# Patient Record
Sex: Female | Born: 1945 | Race: Black or African American | Hispanic: No | Marital: Single | Smoking: Former smoker
Health system: Southern US, Community
[De-identification: ages and names within clinical notes are randomized; demographics above are authoritative.]

## PROBLEM LIST (undated history)

## (undated) DIAGNOSIS — J449 Chronic obstructive pulmonary disease, unspecified: Secondary | ICD-10-CM

## (undated) DIAGNOSIS — I10 Essential (primary) hypertension: Secondary | ICD-10-CM

## (undated) DIAGNOSIS — I130 Hypertensive heart and chronic kidney disease with heart failure and stage 1 through stage 4 chronic kidney disease, or unspecified chronic kidney disease: Secondary | ICD-10-CM

## (undated) DIAGNOSIS — E119 Type 2 diabetes mellitus without complications: Secondary | ICD-10-CM

## (undated) DIAGNOSIS — N183 Chronic kidney disease, stage 3 unspecified: Secondary | ICD-10-CM

## (undated) DIAGNOSIS — I5032 Chronic diastolic (congestive) heart failure: Secondary | ICD-10-CM

## (undated) DIAGNOSIS — J9610 Chronic respiratory failure, unspecified whether with hypoxia or hypercapnia: Secondary | ICD-10-CM

## (undated) HISTORY — DX: Chronic kidney disease, stage 3 unspecified: N18.30

## (undated) HISTORY — DX: Chronic diastolic (congestive) heart failure: I50.32

## (undated) HISTORY — PX: CHOLECYSTECTOMY: SHX55

## (undated) HISTORY — DX: Chronic obstructive pulmonary disease, unspecified: J44.9

## (undated) HISTORY — DX: Chronic kidney disease, stage 3 (moderate): N18.3

## (undated) HISTORY — DX: Essential (primary) hypertension: I10

## (undated) HISTORY — DX: Hypertensive heart and chronic kidney disease with heart failure and stage 1 through stage 4 chronic kidney disease, or unspecified chronic kidney disease: I13.0

## (undated) HISTORY — DX: Chronic respiratory failure, unspecified whether with hypoxia or hypercapnia: J96.10

## (undated) HISTORY — DX: Type 2 diabetes mellitus without complications: E11.9

---

## 2000-04-12 DIAGNOSIS — E119 Type 2 diabetes mellitus without complications: Secondary | ICD-10-CM | POA: Insufficient documentation

## 2000-04-12 DIAGNOSIS — E1165 Type 2 diabetes mellitus with hyperglycemia: Secondary | ICD-10-CM | POA: Insufficient documentation

## 2003-10-28 DIAGNOSIS — J449 Chronic obstructive pulmonary disease, unspecified: Secondary | ICD-10-CM | POA: Insufficient documentation

## 2006-10-29 DIAGNOSIS — I519 Heart disease, unspecified: Secondary | ICD-10-CM | POA: Insufficient documentation

## 2011-07-15 DIAGNOSIS — H2510 Age-related nuclear cataract, unspecified eye: Secondary | ICD-10-CM | POA: Insufficient documentation

## 2011-07-15 DIAGNOSIS — H25019 Cortical age-related cataract, unspecified eye: Secondary | ICD-10-CM | POA: Insufficient documentation

## 2012-04-15 DIAGNOSIS — I5020 Unspecified systolic (congestive) heart failure: Secondary | ICD-10-CM | POA: Insufficient documentation

## 2012-09-23 ENCOUNTER — Inpatient Hospital Stay: Payer: Self-pay | Admitting: Internal Medicine

## 2012-09-23 LAB — COMPREHENSIVE METABOLIC PANEL
Albumin: 2.7 g/dL — ABNORMAL LOW (ref 3.4–5.0)
BUN: 15 mg/dL (ref 7–18)
Chloride: 92 mmol/L — ABNORMAL LOW (ref 98–107)
Creatinine: 1.48 mg/dL — ABNORMAL HIGH (ref 0.60–1.30)
EGFR (African American): 43 — ABNORMAL LOW
EGFR (Non-African Amer.): 37 — ABNORMAL LOW
Glucose: 215 mg/dL — ABNORMAL HIGH (ref 65–99)
SGOT(AST): 25 U/L (ref 15–37)
SGPT (ALT): 36 U/L (ref 12–78)
Total Protein: 6.6 g/dL (ref 6.4–8.2)

## 2012-09-23 LAB — URINALYSIS, COMPLETE
Hyaline Cast: 3
Ketone: NEGATIVE
Protein: >=500
Specific Gravity: 1.013 (ref 1.003–1.030)

## 2012-09-23 LAB — BASIC METABOLIC PANEL
Anion Gap: 0 — ABNORMAL LOW (ref 7–16)
BUN: 16 mg/dL (ref 7–18)
Calcium, Total: 8.5 mg/dL (ref 8.5–10.1)
Chloride: 94 mmol/L — ABNORMAL LOW (ref 98–107)
Co2: 44 mmol/L (ref 21–32)
Sodium: 138 mmol/L (ref 136–145)

## 2012-09-23 LAB — CBC WITH DIFFERENTIAL/PLATELET
Basophil %: 0.9 %
Eosinophil #: 0.4 10*3/uL (ref 0.0–0.7)
Eosinophil %: 2.8 %
HCT: 20.3 % — ABNORMAL LOW (ref 35.0–47.0)
HGB: 5.7 g/dL — ABNORMAL LOW (ref 12.0–16.0)
Lymphocyte #: 1.4 10*3/uL (ref 1.0–3.6)
MCH: 18.2 pg — ABNORMAL LOW (ref 26.0–34.0)
MCHC: 28.1 g/dL — ABNORMAL LOW (ref 32.0–36.0)
MCV: 65 fL — ABNORMAL LOW (ref 80–100)
Monocyte #: 0.7 x10 3/mm (ref 0.2–0.9)
Neutrophil #: 12.5 10*3/uL — ABNORMAL HIGH (ref 1.4–6.5)
Neutrophil %: 82.6 %
Platelet: 393 10*3/uL (ref 150–440)
RDW: 20.2 % — ABNORMAL HIGH (ref 11.5–14.5)
WBC: 15.1 10*3/uL — ABNORMAL HIGH (ref 3.6–11.0)

## 2012-09-23 LAB — TROPONIN I: Troponin-I: 0.02 ng/mL

## 2012-09-24 LAB — CBC WITH DIFFERENTIAL/PLATELET
Basophil #: 0 10*3/uL (ref 0.0–0.1)
Eosinophil #: 0 10*3/uL (ref 0.0–0.7)
Eosinophil %: 0 %
HCT: 25.6 % — ABNORMAL LOW (ref 35.0–47.0)
HGB: 7.6 g/dL — ABNORMAL LOW (ref 12.0–16.0)
MCHC: 29.5 g/dL — ABNORMAL LOW (ref 32.0–36.0)
MCV: 69 fL — ABNORMAL LOW (ref 80–100)
Neutrophil #: 15.7 10*3/uL — ABNORMAL HIGH (ref 1.4–6.5)
Neutrophil %: 90.4 %
Platelet: 387 10*3/uL (ref 150–440)
RBC: 3.71 10*6/uL — ABNORMAL LOW (ref 3.80–5.20)
RDW: 22.6 % — ABNORMAL HIGH (ref 11.5–14.5)
WBC: 17.4 10*3/uL — ABNORMAL HIGH (ref 3.6–11.0)

## 2012-09-24 LAB — BASIC METABOLIC PANEL
Co2: 44 mmol/L (ref 21–32)
Glucose: 254 mg/dL — ABNORMAL HIGH (ref 65–99)
Sodium: 142 mmol/L (ref 136–145)

## 2012-09-24 LAB — T4, FREE: Free Thyroxine: 1 ng/dL (ref 0.76–1.46)

## 2012-09-24 LAB — TSH: Thyroid Stimulating Horm: 0.319 u[IU]/mL — ABNORMAL LOW

## 2012-09-25 LAB — BASIC METABOLIC PANEL
Anion Gap: 2 — ABNORMAL LOW (ref 7–16)
Calcium, Total: 8.3 mg/dL — ABNORMAL LOW (ref 8.5–10.1)
Chloride: 93 mmol/L — ABNORMAL LOW (ref 98–107)
Creatinine: 1.73 mg/dL — ABNORMAL HIGH (ref 0.60–1.30)
EGFR (Non-African Amer.): 31 — ABNORMAL LOW
Osmolality: 286 (ref 275–301)

## 2012-09-25 LAB — CBC WITH DIFFERENTIAL/PLATELET
Basophil #: 0 10*3/uL (ref 0.0–0.1)
Eosinophil %: 0.2 %
HCT: 23.5 % — ABNORMAL LOW (ref 35.0–47.0)
HGB: 7.2 g/dL — ABNORMAL LOW (ref 12.0–16.0)
Lymphocyte #: 0.6 10*3/uL — ABNORMAL LOW (ref 1.0–3.6)
Lymphocyte %: 4.1 %
MCH: 20.9 pg — ABNORMAL LOW (ref 26.0–34.0)
Monocyte %: 1.5 %
Neutrophil %: 93.9 %
RBC: 3.43 10*6/uL — ABNORMAL LOW (ref 3.80–5.20)
WBC: 14.7 10*3/uL — ABNORMAL HIGH (ref 3.6–11.0)

## 2012-09-26 LAB — CBC WITH DIFFERENTIAL/PLATELET
Basophil #: 0 10*3/uL (ref 0.0–0.1)
Eosinophil %: 0.1 %
HCT: 26.4 % — ABNORMAL LOW (ref 35.0–47.0)
Lymphocyte %: 7 %
MCH: 21.9 pg — ABNORMAL LOW (ref 26.0–34.0)
MCHC: 31.5 g/dL — ABNORMAL LOW (ref 32.0–36.0)
MCV: 70 fL — ABNORMAL LOW (ref 80–100)
Monocyte #: 0.5 x10 3/mm (ref 0.2–0.9)
Neutrophil #: 12.3 10*3/uL — ABNORMAL HIGH (ref 1.4–6.5)
Platelet: 320 10*3/uL (ref 150–440)
RDW: 25.1 % — ABNORMAL HIGH (ref 11.5–14.5)
WBC: 13.8 10*3/uL — ABNORMAL HIGH (ref 3.6–11.0)

## 2012-09-26 LAB — BASIC METABOLIC PANEL
Anion Gap: 3 — ABNORMAL LOW (ref 7–16)
EGFR (African American): 43 — ABNORMAL LOW
EGFR (Non-African Amer.): 37 — ABNORMAL LOW
Glucose: 295 mg/dL — ABNORMAL HIGH (ref 65–99)
Osmolality: 291 (ref 275–301)
Potassium: 3.6 mmol/L (ref 3.5–5.1)
Sodium: 137 mmol/L (ref 136–145)

## 2012-09-28 LAB — CULTURE, BLOOD (SINGLE)

## 2012-11-10 ENCOUNTER — Ambulatory Visit: Payer: Self-pay | Admitting: Internal Medicine

## 2012-11-28 ENCOUNTER — Inpatient Hospital Stay: Payer: Self-pay | Admitting: Internal Medicine

## 2012-11-28 DIAGNOSIS — I359 Nonrheumatic aortic valve disorder, unspecified: Secondary | ICD-10-CM

## 2012-11-28 LAB — CBC WITH DIFFERENTIAL/PLATELET
Basophil #: 0 10*3/uL (ref 0.0–0.1)
Eosinophil #: 0 10*3/uL (ref 0.0–0.7)
Eosinophil %: 0 %
HCT: 22.4 % — ABNORMAL LOW (ref 35.0–47.0)
HGB: 6.7 g/dL — ABNORMAL LOW (ref 12.0–16.0)
Lymphocyte #: 0.5 10*3/uL — ABNORMAL LOW (ref 1.0–3.6)
Lymphocyte %: 5.3 %
MCHC: 29.9 g/dL — ABNORMAL LOW (ref 32.0–36.0)
MCV: 63 fL — ABNORMAL LOW (ref 80–100)
Monocyte %: 1.3 %
Neutrophil %: 93.2 %
Platelet: 275 10*3/uL (ref 150–440)
RBC: 3.54 10*6/uL — ABNORMAL LOW (ref 3.80–5.20)
WBC: 9.2 10*3/uL (ref 3.6–11.0)

## 2012-11-28 LAB — CBC
MCH: 18.4 pg — ABNORMAL LOW (ref 26.0–34.0)
MCHC: 28.7 g/dL — ABNORMAL LOW (ref 32.0–36.0)
RDW: 21.7 % — ABNORMAL HIGH (ref 11.5–14.5)
WBC: 12.5 10*3/uL — ABNORMAL HIGH (ref 3.6–11.0)

## 2012-11-28 LAB — COMPREHENSIVE METABOLIC PANEL
BUN: 27 mg/dL — ABNORMAL HIGH (ref 7–18)
Calcium, Total: 9.3 mg/dL (ref 8.5–10.1)
Chloride: 90 mmol/L — ABNORMAL LOW (ref 98–107)
Creatinine: 1.48 mg/dL — ABNORMAL HIGH (ref 0.60–1.30)
EGFR (Non-African Amer.): 37 — ABNORMAL LOW
Glucose: 276 mg/dL — ABNORMAL HIGH (ref 65–99)
Osmolality: 289 (ref 275–301)

## 2012-11-28 LAB — IRON AND TIBC
Iron Saturation: 7 %
Iron: 26 ug/dL — ABNORMAL LOW (ref 50–170)

## 2012-11-28 LAB — FERRITIN: Ferritin (ARMC): 10 ng/mL (ref 8–388)

## 2012-11-28 LAB — PRO B NATRIURETIC PEPTIDE: B-Type Natriuretic Peptide: 3276 pg/mL — ABNORMAL HIGH (ref 0–125)

## 2012-11-29 LAB — CBC WITH DIFFERENTIAL/PLATELET
Basophil #: 0 10*3/uL (ref 0.0–0.1)
Basophil %: 0.2 %
Eosinophil #: 0 10*3/uL (ref 0.0–0.7)
Eosinophil %: 0 %
HGB: 8.2 g/dL — ABNORMAL LOW (ref 12.0–16.0)
Lymphocyte #: 1.2 10*3/uL (ref 1.0–3.6)
MCH: 19.7 pg — ABNORMAL LOW (ref 26.0–34.0)
MCHC: 30.3 g/dL — ABNORMAL LOW (ref 32.0–36.0)
Monocyte #: 0.5 x10 3/mm (ref 0.2–0.9)
Monocyte %: 3.6 %
RBC: 4.15 10*6/uL (ref 3.80–5.20)
RDW: 23 % — ABNORMAL HIGH (ref 11.5–14.5)
WBC: 13.7 10*3/uL — ABNORMAL HIGH (ref 3.6–11.0)

## 2012-11-29 LAB — LIPID PANEL
Cholesterol: 217 mg/dL — ABNORMAL HIGH (ref 0–200)
Ldl Cholesterol, Calc: 151 mg/dL — ABNORMAL HIGH (ref 0–100)
Triglycerides: 172 mg/dL (ref 0–200)
VLDL Cholesterol, Calc: 34 mg/dL (ref 5–40)

## 2012-11-29 LAB — COMPREHENSIVE METABOLIC PANEL
Albumin: 2.6 g/dL — ABNORMAL LOW (ref 3.4–5.0)
Alkaline Phosphatase: 107 U/L (ref 50–136)
Bilirubin,Total: 0.7 mg/dL (ref 0.2–1.0)
Calcium, Total: 9.1 mg/dL (ref 8.5–10.1)
Co2: 38 mmol/L — ABNORMAL HIGH (ref 21–32)
Creatinine: 1.83 mg/dL — ABNORMAL HIGH (ref 0.60–1.30)
EGFR (African American): 33 — ABNORMAL LOW
Glucose: 276 mg/dL — ABNORMAL HIGH (ref 65–99)
Potassium: 2.8 mmol/L — ABNORMAL LOW (ref 3.5–5.1)
SGOT(AST): 15 U/L (ref 15–37)
SGPT (ALT): 11 U/L — ABNORMAL LOW (ref 12–78)

## 2012-11-29 LAB — TSH: Thyroid Stimulating Horm: 0.3 u[IU]/mL — ABNORMAL LOW

## 2012-11-29 LAB — HEMOGLOBIN A1C: Hemoglobin A1C: 6.1 % (ref 4.2–6.3)

## 2012-11-29 LAB — POTASSIUM: Potassium: 3.7 mmol/L (ref 3.5–5.1)

## 2012-11-30 LAB — CBC WITH DIFFERENTIAL/PLATELET
Basophil #: 0 10*3/uL (ref 0.0–0.1)
Basophil %: 0.2 %
Eosinophil #: 0 10*3/uL (ref 0.0–0.7)
Eosinophil %: 0 %
HGB: 8.6 g/dL — ABNORMAL LOW (ref 12.0–16.0)
Lymphocyte #: 1.9 10*3/uL (ref 1.0–3.6)
MCH: 20 pg — ABNORMAL LOW (ref 26.0–34.0)
MCHC: 30.7 g/dL — ABNORMAL LOW (ref 32.0–36.0)
MCV: 65 fL — ABNORMAL LOW (ref 80–100)
Monocyte #: 0.4 x10 3/mm (ref 0.2–0.9)
Neutrophil #: 23 10*3/uL — ABNORMAL HIGH (ref 1.4–6.5)
Neutrophil %: 90.8 %
RDW: 23.3 % — ABNORMAL HIGH (ref 11.5–14.5)
WBC: 25.4 10*3/uL — ABNORMAL HIGH (ref 3.6–11.0)

## 2012-11-30 LAB — BASIC METABOLIC PANEL
Anion Gap: 7 (ref 7–16)
BUN: 36 mg/dL — ABNORMAL HIGH (ref 7–18)
Chloride: 104 mmol/L (ref 98–107)
Co2: 34 mmol/L — ABNORMAL HIGH (ref 21–32)

## 2012-11-30 LAB — MAGNESIUM: Magnesium: 1.9 mg/dL

## 2012-12-01 LAB — CBC WITH DIFFERENTIAL/PLATELET
Basophil %: 0.1 %
Eosinophil #: 0 10*3/uL (ref 0.0–0.7)
HCT: 25.4 % — ABNORMAL LOW (ref 35.0–47.0)
HGB: 7.7 g/dL — ABNORMAL LOW (ref 12.0–16.0)
Lymphocyte #: 1.2 10*3/uL (ref 1.0–3.6)
Lymphocyte %: 4.7 %
MCH: 19.9 pg — ABNORMAL LOW (ref 26.0–34.0)
Monocyte %: 3.2 %
Neutrophil #: 23.6 10*3/uL — ABNORMAL HIGH (ref 1.4–6.5)
Neutrophil %: 92 %
Platelet: 284 10*3/uL (ref 150–440)
RDW: 24 % — ABNORMAL HIGH (ref 11.5–14.5)

## 2012-12-01 LAB — BASIC METABOLIC PANEL
Anion Gap: 3 — ABNORMAL LOW (ref 7–16)
EGFR (Non-African Amer.): 28 — ABNORMAL LOW
Osmolality: 301 (ref 275–301)
Sodium: 144 mmol/L (ref 136–145)

## 2012-12-01 LAB — OCCULT BLOOD X 1 CARD TO LAB, STOOL: Occult Blood, Feces: NEGATIVE

## 2012-12-01 LAB — HEMOGLOBIN: HGB: 8.2 g/dL — ABNORMAL LOW (ref 12.0–16.0)

## 2012-12-02 LAB — CBC WITH DIFFERENTIAL/PLATELET
Basophil %: 0.1 %
Eosinophil #: 0 10*3/uL (ref 0.0–0.7)
Eosinophil %: 0.1 %
HCT: 27.6 % — ABNORMAL LOW (ref 35.0–47.0)
Lymphocyte %: 6.8 %
MCH: 17.6 pg — ABNORMAL LOW (ref 26.0–34.0)
MCV: 66 fL — ABNORMAL LOW (ref 80–100)
Platelet: 279 10*3/uL (ref 150–440)
RBC: 4.19 10*6/uL (ref 3.80–5.20)
WBC: 27 10*3/uL — ABNORMAL HIGH (ref 3.6–11.0)

## 2012-12-02 LAB — BASIC METABOLIC PANEL
Anion Gap: 3 — ABNORMAL LOW (ref 7–16)
Chloride: 106 mmol/L (ref 98–107)
EGFR (African American): 43 — ABNORMAL LOW
Glucose: 168 mg/dL — ABNORMAL HIGH (ref 65–99)
Potassium: 3.2 mmol/L — ABNORMAL LOW (ref 3.5–5.1)
Sodium: 144 mmol/L (ref 136–145)

## 2012-12-03 LAB — BASIC METABOLIC PANEL
Anion Gap: 1 — ABNORMAL LOW (ref 7–16)
BUN: 37 mg/dL — ABNORMAL HIGH (ref 7–18)
Calcium, Total: 8.6 mg/dL (ref 8.5–10.1)
Co2: 36 mmol/L — ABNORMAL HIGH (ref 21–32)
Creatinine: 1.45 mg/dL — ABNORMAL HIGH (ref 0.60–1.30)
EGFR (African American): 44 — ABNORMAL LOW
EGFR (Non-African Amer.): 38 — ABNORMAL LOW
Glucose: 224 mg/dL — ABNORMAL HIGH (ref 65–99)
Potassium: 4.1 mmol/L (ref 3.5–5.1)

## 2012-12-03 LAB — CBC WITH DIFFERENTIAL/PLATELET
Basophil #: 0 10*3/uL (ref 0.0–0.1)
Basophil %: 0.1 %
HCT: 27.9 % — ABNORMAL LOW (ref 35.0–47.0)
HGB: 7.3 g/dL — ABNORMAL LOW (ref 12.0–16.0)
Lymphocyte %: 4.7 %
MCH: 17.4 pg — ABNORMAL LOW (ref 26.0–34.0)
MCHC: <26.5 g/dL (ref 32.0–36.0)
Monocyte #: 0.7 x10 3/mm (ref 0.2–0.9)
Monocyte %: 2.5 %
Neutrophil %: 92.6 %
Platelet: 262 10*3/uL (ref 150–440)
RBC: 4.2 10*6/uL (ref 3.80–5.20)
WBC: 28 10*3/uL — ABNORMAL HIGH (ref 3.6–11.0)

## 2012-12-03 LAB — CULTURE, BLOOD (SINGLE)

## 2012-12-04 LAB — CBC WITH DIFFERENTIAL/PLATELET
Basophil #: 0 10*3/uL (ref 0.0–0.1)
Eosinophil %: 0.7 %
Lymphocyte #: 1.2 10*3/uL (ref 1.0–3.6)
MCHC: 30.7 g/dL — ABNORMAL LOW (ref 32.0–36.0)
MCV: 68 fL — ABNORMAL LOW (ref 80–100)
Monocyte #: 0.4 x10 3/mm (ref 0.2–0.9)
Monocyte %: 2.1 %
Neutrophil #: 18 10*3/uL — ABNORMAL HIGH (ref 1.4–6.5)
RBC: 4.34 10*6/uL (ref 3.80–5.20)
RDW: 25.8 % — ABNORMAL HIGH (ref 11.5–14.5)
WBC: 19.7 10*3/uL — ABNORMAL HIGH (ref 3.6–11.0)

## 2012-12-04 LAB — BASIC METABOLIC PANEL
Anion Gap: 4 — ABNORMAL LOW (ref 7–16)
Calcium, Total: 9 mg/dL (ref 8.5–10.1)
Co2: 33 mmol/L — ABNORMAL HIGH (ref 21–32)
Creatinine: 1.26 mg/dL (ref 0.60–1.30)
Osmolality: 302 (ref 275–301)
Potassium: 4 mmol/L (ref 3.5–5.1)

## 2012-12-07 LAB — BASIC METABOLIC PANEL
Anion Gap: 2 — ABNORMAL LOW (ref 7–16)
BUN: 31 mg/dL — ABNORMAL HIGH (ref 7–18)
Calcium, Total: 8.5 mg/dL (ref 8.5–10.1)
Chloride: 104 mmol/L (ref 98–107)
Creatinine: 1.3 mg/dL (ref 0.60–1.30)
EGFR (African American): 50 — ABNORMAL LOW
EGFR (Non-African Amer.): 43 — ABNORMAL LOW
Glucose: 157 mg/dL — ABNORMAL HIGH (ref 65–99)
Osmolality: 291 (ref 275–301)
Potassium: 3.5 mmol/L (ref 3.5–5.1)
Sodium: 141 mmol/L (ref 136–145)

## 2012-12-07 LAB — CBC WITH DIFFERENTIAL/PLATELET
Basophil #: 0 10*3/uL (ref 0.0–0.1)
Eosinophil %: 1.5 %
HCT: 27.7 % — ABNORMAL LOW (ref 35.0–47.0)
Lymphocyte #: 2.1 10*3/uL (ref 1.0–3.6)
Lymphocyte %: 12.9 %
MCH: 20.2 pg — ABNORMAL LOW (ref 26.0–34.0)
MCHC: 29.7 g/dL — ABNORMAL LOW (ref 32.0–36.0)
Monocyte #: 1.9 x10 3/mm — ABNORMAL HIGH (ref 0.2–0.9)
Neutrophil #: 11.9 10*3/uL — ABNORMAL HIGH (ref 1.4–6.5)
Platelet: 201 10*3/uL (ref 150–440)
RBC: 4.07 10*6/uL (ref 3.80–5.20)
RDW: 25.5 % — ABNORMAL HIGH (ref 11.5–14.5)

## 2012-12-09 LAB — BASIC METABOLIC PANEL
Calcium, Total: 8.9 mg/dL (ref 8.5–10.1)
Chloride: 104 mmol/L (ref 98–107)
Co2: 36 mmol/L — ABNORMAL HIGH (ref 21–32)
Creatinine: 1.35 mg/dL — ABNORMAL HIGH (ref 0.60–1.30)
EGFR (African American): 48 — ABNORMAL LOW
EGFR (Non-African Amer.): 41 — ABNORMAL LOW
Glucose: 164 mg/dL — ABNORMAL HIGH (ref 65–99)
Osmolality: 292 (ref 275–301)
Potassium: 4.2 mmol/L (ref 3.5–5.1)
Sodium: 141 mmol/L (ref 136–145)

## 2012-12-09 LAB — CBC WITH DIFFERENTIAL/PLATELET
Basophil %: 0.2 %
Eosinophil #: 0.1 10*3/uL (ref 0.0–0.7)
Eosinophil %: 0.4 %
HGB: 9 g/dL — ABNORMAL LOW (ref 12.0–16.0)
MCHC: 31 g/dL — ABNORMAL LOW (ref 32.0–36.0)
Monocyte %: 6.6 %
Neutrophil #: 15.3 10*3/uL — ABNORMAL HIGH (ref 1.4–6.5)
Neutrophil %: 82.7 %
RBC: 4.21 10*6/uL (ref 3.80–5.20)
RDW: 26.1 % — ABNORMAL HIGH (ref 11.5–14.5)
WBC: 18.5 10*3/uL — ABNORMAL HIGH (ref 3.6–11.0)

## 2012-12-11 ENCOUNTER — Ambulatory Visit: Payer: Self-pay | Admitting: Internal Medicine

## 2012-12-21 ENCOUNTER — Other Ambulatory Visit: Payer: Self-pay | Admitting: Family Medicine

## 2012-12-21 LAB — CBC WITH DIFFERENTIAL/PLATELET
Basophil #: 0.1 10*3/uL (ref 0.0–0.1)
HCT: 27.1 % — ABNORMAL LOW (ref 35.0–47.0)
Lymphocyte %: 11.7 %
MCV: 70 fL — ABNORMAL LOW (ref 80–100)
Neutrophil #: 6.8 10*3/uL — ABNORMAL HIGH (ref 1.4–6.5)
Neutrophil %: 75.7 %
RBC: 3.85 10*6/uL (ref 3.80–5.20)

## 2012-12-21 LAB — URINALYSIS, COMPLETE
Bilirubin,UR: NEGATIVE
Hyaline Cast: 3
Ketone: NEGATIVE
Leukocyte Esterase: NEGATIVE
Nitrite: NEGATIVE
Protein: >=500
Specific Gravity: 1.018 (ref 1.003–1.030)
WBC UR: 1 /HPF (ref 0–5)

## 2012-12-21 LAB — BASIC METABOLIC PANEL
Anion Gap: 3 — ABNORMAL LOW (ref 7–16)
BUN: 10 mg/dL (ref 7–18)
Calcium, Total: 8.5 mg/dL (ref 8.5–10.1)
Chloride: 104 mmol/L (ref 98–107)
Creatinine: 1.11 mg/dL (ref 0.60–1.30)
EGFR (African American): >60
EGFR (Non-African Amer.): 52 — ABNORMAL LOW
Potassium: 3.3 mmol/L — ABNORMAL LOW (ref 3.5–5.1)

## 2012-12-22 ENCOUNTER — Inpatient Hospital Stay: Payer: Self-pay | Admitting: Internal Medicine

## 2012-12-22 LAB — IRON AND TIBC
Iron Bind.Cap.(Total): 223 ug/dL — ABNORMAL LOW (ref 250–450)
Iron: 33 ug/dL — ABNORMAL LOW (ref 50–170)

## 2012-12-22 LAB — BASIC METABOLIC PANEL
Anion Gap: 2 — ABNORMAL LOW (ref 7–16)
BUN: 8 mg/dL (ref 7–18)
Calcium, Total: 8.7 mg/dL (ref 8.5–10.1)
Chloride: 104 mmol/L (ref 98–107)
EGFR (Non-African Amer.): 50 — ABNORMAL LOW
Osmolality: 289 (ref 275–301)
Sodium: 145 mmol/L (ref 136–145)

## 2012-12-22 LAB — CK TOTAL AND CKMB (NOT AT ARMC)
CK, Total: 53 U/L (ref 21–215)
CK-MB: <0.5 ng/mL — ABNORMAL LOW (ref 0.5–3.6)
CK-MB: 1 ng/mL (ref 0.5–3.6)

## 2012-12-22 LAB — TROPONIN I
Troponin-I: <0.02 ng/mL
Troponin-I: 0.02 ng/mL

## 2012-12-22 LAB — TSH: Thyroid Stimulating Horm: 1.17 u[IU]/mL

## 2012-12-22 LAB — FERRITIN: Ferritin (ARMC): 79 ng/mL (ref 8–388)

## 2012-12-22 LAB — PRO B NATRIURETIC PEPTIDE: B-Type Natriuretic Peptide: 10174 pg/mL — ABNORMAL HIGH (ref 0–125)

## 2012-12-22 LAB — URINE CULTURE

## 2012-12-23 LAB — CBC WITH DIFFERENTIAL/PLATELET
Basophil #: 0 10*3/uL (ref 0.0–0.1)
Eosinophil #: 0.4 10*3/uL (ref 0.0–0.7)
Eosinophil %: 4.4 %
HCT: 26.7 % — ABNORMAL LOW (ref 35.0–47.0)
Lymphocyte #: 0.7 10*3/uL — ABNORMAL LOW (ref 1.0–3.6)
Lymphocyte %: 8.4 %
MCHC: 31.1 g/dL — ABNORMAL LOW (ref 32.0–36.0)
Monocyte %: 5.8 %
Neutrophil #: 7 10*3/uL — ABNORMAL HIGH (ref 1.4–6.5)
Platelet: 249 10*3/uL (ref 150–440)
WBC: 8.7 10*3/uL (ref 3.6–11.0)

## 2012-12-23 LAB — COMPREHENSIVE METABOLIC PANEL
Albumin: 2.5 g/dL — ABNORMAL LOW (ref 3.4–5.0)
Alkaline Phosphatase: 85 U/L (ref 50–136)
BUN: 10 mg/dL (ref 7–18)
Calcium, Total: 8.7 mg/dL (ref 8.5–10.1)
Chloride: 101 mmol/L (ref 98–107)
Co2: 38 mmol/L — ABNORMAL HIGH (ref 21–32)
Glucose: 153 mg/dL — ABNORMAL HIGH (ref 65–99)
SGPT (ALT): 18 U/L (ref 12–78)

## 2012-12-23 LAB — LIPID PANEL
Cholesterol: 139 mg/dL (ref 0–200)
Triglycerides: 101 mg/dL (ref 0–200)
VLDL Cholesterol, Calc: 20 mg/dL (ref 5–40)

## 2012-12-24 DIAGNOSIS — J449 Chronic obstructive pulmonary disease, unspecified: Secondary | ICD-10-CM

## 2012-12-24 DIAGNOSIS — I5033 Acute on chronic diastolic (congestive) heart failure: Secondary | ICD-10-CM

## 2012-12-24 DIAGNOSIS — I10 Essential (primary) hypertension: Secondary | ICD-10-CM

## 2012-12-24 LAB — BASIC METABOLIC PANEL
Anion Gap: 2 — ABNORMAL LOW (ref 7–16)
BUN: 13 mg/dL (ref 7–18)
Calcium, Total: 8.9 mg/dL (ref 8.5–10.1)
Chloride: 98 mmol/L (ref 98–107)
Co2: 42 mmol/L (ref 21–32)
Osmolality: 287 (ref 275–301)
Potassium: 4 mmol/L (ref 3.5–5.1)
Sodium: 142 mmol/L (ref 136–145)

## 2012-12-25 LAB — BASIC METABOLIC PANEL
BUN: 15 mg/dL (ref 7–18)
Calcium, Total: 9.3 mg/dL (ref 8.5–10.1)
Chloride: 96 mmol/L — ABNORMAL LOW (ref 98–107)
Co2: 44 mmol/L (ref 21–32)
Creatinine: 1.06 mg/dL (ref 0.60–1.30)
EGFR (Non-African Amer.): 55 — ABNORMAL LOW
Glucose: 153 mg/dL — ABNORMAL HIGH (ref 65–99)
Sodium: 139 mmol/L (ref 136–145)

## 2012-12-25 LAB — HEMOGLOBIN: HGB: 8.6 g/dL — ABNORMAL LOW (ref 12.0–16.0)

## 2012-12-31 ENCOUNTER — Other Ambulatory Visit: Payer: Self-pay | Admitting: Family Medicine

## 2012-12-31 LAB — CBC WITH DIFFERENTIAL/PLATELET
Basophil #: 0.1 10*3/uL (ref 0.0–0.1)
Eosinophil %: 4.7 %
HCT: 27.2 % — ABNORMAL LOW (ref 35.0–47.0)
HGB: 8.5 g/dL — ABNORMAL LOW (ref 12.0–16.0)
Lymphocyte #: 1.2 10*3/uL (ref 1.0–3.6)
MCH: 21.9 pg — ABNORMAL LOW (ref 26.0–34.0)
MCHC: 31.2 g/dL — ABNORMAL LOW (ref 32.0–36.0)
Monocyte #: 0.5 x10 3/mm (ref 0.2–0.9)
Monocyte %: 6.5 %
Neutrophil #: 5.5 10*3/uL (ref 1.4–6.5)
Neutrophil %: 72 %
Platelet: 237 10*3/uL (ref 150–440)
RBC: 3.87 10*6/uL (ref 3.80–5.20)
RDW: 22.6 % — ABNORMAL HIGH (ref 11.5–14.5)
WBC: 7.6 10*3/uL (ref 3.6–11.0)

## 2012-12-31 LAB — COMPREHENSIVE METABOLIC PANEL
Anion Gap: 1 — ABNORMAL LOW (ref 7–16)
BUN: 18 mg/dL (ref 7–18)
Bilirubin,Total: 0.2 mg/dL (ref 0.2–1.0)
Co2: 39 mmol/L — ABNORMAL HIGH (ref 21–32)
EGFR (African American): >60
Glucose: 149 mg/dL — ABNORMAL HIGH (ref 65–99)
Osmolality: 282 (ref 275–301)
SGOT(AST): 18 U/L (ref 15–37)

## 2013-01-01 ENCOUNTER — Encounter: Payer: Self-pay | Admitting: *Deleted

## 2013-01-01 ENCOUNTER — Encounter: Payer: Medicare Other | Admitting: Cardiovascular Disease

## 2013-01-19 ENCOUNTER — Institutional Professional Consult (permissible substitution): Payer: Medicare Other | Admitting: Pulmonary Disease

## 2013-01-21 ENCOUNTER — Encounter: Payer: Medicare Other | Admitting: Cardiovascular Disease

## 2013-01-26 ENCOUNTER — Institutional Professional Consult (permissible substitution): Payer: Medicare Other | Admitting: Pulmonary Disease

## 2013-01-26 ENCOUNTER — Encounter: Payer: Self-pay | Admitting: Cardiovascular Disease

## 2013-01-26 ENCOUNTER — Ambulatory Visit (INDEPENDENT_AMBULATORY_CARE_PROVIDER_SITE_OTHER): Payer: Medicare Other | Admitting: Cardiovascular Disease

## 2013-01-26 VITALS — BP 132/79 | HR 64 | Ht 64.0 in | Wt 204.8 lb

## 2013-01-26 DIAGNOSIS — R0602 Shortness of breath: Secondary | ICD-10-CM

## 2013-01-26 DIAGNOSIS — I10 Essential (primary) hypertension: Secondary | ICD-10-CM

## 2013-01-26 DIAGNOSIS — I5032 Chronic diastolic (congestive) heart failure: Secondary | ICD-10-CM

## 2013-01-26 NOTE — Assessment & Plan Note (Signed)
Blood pressure has been well-controlled on current medications which makes it unlikely that she has significant renal artery stenosis.

## 2013-01-26 NOTE — Assessment & Plan Note (Signed)
She appears to be euvolemic on current dose of Lasix 80 mg in the morning and 40 mg in the afternoon. I had a prolonged discussion with her about the importance of low sodium diet as well as daily monitoring of her weight. I have a suspicion that she is not fully compliant with low sodium diet which was emphasized today. She does have chronic kidney disease and renal function should be monitored on a regular basis. She has no symptoms suggestive of underlying ischemic heart disease. Continue medical therapy.

## 2013-01-26 NOTE — Progress Notes (Signed)
HPI  This is a 67 year old female who was referred by Dr. Dario Guardian for management of chronic diastolic heart failure. She has multiple comorbidities including obesity, COPD on 3 L of oxygen, hypertension, type 2 diabetes, chronic kidney disease and chronic diastolic heart failure. She had 2 hospitalizations in July and August for acute on chronic diastolic heart failure with significant hypoxia. She was out of furosemide on both occasions. She is normally on 80 mg in the morning and 40 mg in the afternoon. She responded well to IV diuretics. She was seen by our team while hospitalized. Since hospital discharge, she has been doing reasonably well his she reports improvement in dyspnea. She denies any chest discomfort. No orthopnea or PND. No extremity edema has improved significantly. Most recent echocardiogram cause in July 2014 which showed an ejection fraction of 55-60% with mildly dilated left atrium, mild aortic regurgitation and mild tricuspid regurgitation with no evidence of pulmonary hypertension.  No Known Allergies   Current Outpatient Prescriptions on File Prior to Visit  Medication Sig Dispense Refill  . acetaminophen (TYLENOL) 325 MG tablet Take 325 mg by mouth every 4 (four) hours as needed for pain.      . pravastatin (PRAVACHOL) 20 MG tablet Take 20 mg by mouth daily.      . quinapril (ACCUPRIL) 40 MG tablet Take 40 mg by mouth 2 (two) times daily.        No current facility-administered medications on file prior to visit.     Past Medical History  Diagnosis Date  . COPD (chronic obstructive pulmonary disease)   . Chronic respiratory failure   . Diabetes mellitus without complication   . Chronic diastolic heart failure   . Hypertension   . 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 Cholecystectomy       Family History  Problem Relation Age of  Onset  . Hypertension Mother   . Hypertension Father      History   Social History  . Marital Status: Single    Spouse Name: N/A    Number of Children: N/A  . Years of Education: N/A   Occupational History  . Not on file.   Social History Main Topics  . Smoking status: Former Smoker -- 3.00 packs/day for 20 years  . Smokeless tobacco: Not on file  . Alcohol Use: No  . Drug Use: No  . Sexual Activity: Not on file   Other Topics Concern  . Not on file   Social History Narrative  . No narrative on file     PHYSICAL EXAM   BP 132/79  Pulse 64  Ht 5\' 4"  (1.626 m)  Wt 204 lb 12 oz (92.874 kg)  BMI 35.13 kg/m2 Constitutional: She is oriented to person, place, and time. She appears well-developed and well-nourished. No distress.  HENT: No nasal discharge.  Head: Normocephalic and atraumatic.  Eyes: Pupils are equal and round. Right eye exhibits no discharge. Left eye exhibits no discharge.  Neck: Normal range of motion. Neck supple. No JVD present. No thyromegaly present.  Cardiovascular: Normal rate, regular rhythm, normal heart sounds. Exam reveals no gallop and no friction rub. No murmur heard.  Pulmonary/Chest: Effort normal and diminished breath sounds. No stridor. No respiratory distress. She has no wheezes. She has no rales. She exhibits no tenderness.  Abdominal: Soft. Bowel sounds are normal. She  exhibits no distension. There is no tenderness. There is no rebound and no guarding.  Musculoskeletal: Normal range of motion. She exhibits trace edema and no tenderness.  Neurological: She is alert and oriented to person, place, and time. Coordination normal.  Skin: Skin is warm and dry. No rash noted. She is not diaphoretic. No erythema. No pallor.  Psychiatric: She has a normal mood and affect. Her behavior is normal. Judgment and thought content normal.     EKG: Normal sinus rhythm with nonspecific T wave changes   ASSESSMENT AND PLAN

## 2013-01-26 NOTE — Patient Instructions (Addendum)
Continue same medications.  Follow up in 3 months.  

## 2013-02-23 ENCOUNTER — Institutional Professional Consult (permissible substitution): Payer: Medicare Other | Admitting: Pulmonary Disease

## 2013-04-13 ENCOUNTER — Institutional Professional Consult (permissible substitution): Payer: Medicare Other | Admitting: Pulmonary Disease

## 2013-04-27 ENCOUNTER — Ambulatory Visit: Payer: Medicare Other | Admitting: Cardiovascular Disease

## 2013-05-03 ENCOUNTER — Ambulatory Visit: Payer: Medicare Other | Admitting: Cardiovascular Disease

## 2013-05-03 ENCOUNTER — Encounter: Payer: Self-pay | Admitting: *Deleted

## 2013-05-24 ENCOUNTER — Institutional Professional Consult (permissible substitution): Payer: Medicare Other | Admitting: Pulmonary Disease

## 2013-06-15 ENCOUNTER — Ambulatory Visit (INDEPENDENT_AMBULATORY_CARE_PROVIDER_SITE_OTHER): Payer: Medicare Other | Admitting: Pulmonary Disease

## 2013-06-15 DIAGNOSIS — R0602 Shortness of breath: Secondary | ICD-10-CM

## 2013-06-15 NOTE — Progress Notes (Signed)
No show

## 2013-06-18 ENCOUNTER — Encounter: Payer: Self-pay | Admitting: Pulmonary Disease

## 2013-08-03 ENCOUNTER — Ambulatory Visit: Payer: Self-pay | Admitting: Internal Medicine

## 2013-09-17 ENCOUNTER — Encounter: Payer: Self-pay | Admitting: Pulmonary Disease

## 2013-09-21 ENCOUNTER — Institutional Professional Consult (permissible substitution): Payer: Medicare Other | Admitting: Pulmonary Disease

## 2013-10-10 ENCOUNTER — Inpatient Hospital Stay: Payer: Self-pay | Admitting: Internal Medicine

## 2013-10-10 LAB — CK TOTAL AND CKMB (NOT AT ARMC)
CK, Total: 80 U/L
CK-MB: 1 ng/mL (ref 0.5–3.6)

## 2013-10-10 LAB — BASIC METABOLIC PANEL
ANION GAP: 6 — AB (ref 7–16)
BUN: 58 mg/dL — ABNORMAL HIGH (ref 7–18)
CHLORIDE: 97 mmol/L — AB (ref 98–107)
CREATININE: 2.87 mg/dL — AB (ref 0.60–1.30)
Calcium, Total: 8.9 mg/dL (ref 8.5–10.1)
Co2: 30 mmol/L (ref 21–32)
EGFR (African American): 19 — ABNORMAL LOW
GFR CALC NON AF AMER: 17 — AB
Glucose: 105 mg/dL — ABNORMAL HIGH (ref 65–99)
Osmolality: 283 (ref 275–301)
Potassium: 4.5 mmol/L (ref 3.5–5.1)
SODIUM: 133 mmol/L — AB (ref 136–145)

## 2013-10-10 LAB — CBC
HCT: 30.7 % — ABNORMAL LOW (ref 35.0–47.0)
HGB: 9.3 g/dL — ABNORMAL LOW (ref 12.0–16.0)
MCH: 20.9 pg — ABNORMAL LOW (ref 26.0–34.0)
MCHC: 30.4 g/dL — AB (ref 32.0–36.0)
MCV: 69 fL — ABNORMAL LOW (ref 80–100)
Platelet: 230 10*3/uL (ref 150–440)
RBC: 4.46 10*6/uL (ref 3.80–5.20)
RDW: 15.2 % — ABNORMAL HIGH (ref 11.5–14.5)
WBC: 24.1 10*3/uL — ABNORMAL HIGH (ref 3.6–11.0)

## 2013-10-10 LAB — TROPONIN I: Troponin-I: <0.02 ng/mL

## 2013-10-11 LAB — CBC WITH DIFFERENTIAL/PLATELET
BASOS PCT: 0.6 %
Basophil #: 0.1 10*3/uL (ref 0.0–0.1)
Eosinophil #: 0 10*3/uL (ref 0.0–0.7)
Eosinophil %: 0.1 %
HCT: 28.5 % — AB (ref 35.0–47.0)
HGB: 8.6 g/dL — ABNORMAL LOW (ref 12.0–16.0)
LYMPHS PCT: 3.9 %
Lymphocyte #: 1 10*3/uL (ref 1.0–3.6)
MCH: 20.8 pg — AB (ref 26.0–34.0)
MCHC: 30 g/dL — ABNORMAL LOW (ref 32.0–36.0)
MCV: 69 fL — ABNORMAL LOW (ref 80–100)
MONO ABS: 1.8 x10 3/mm — AB (ref 0.2–0.9)
Monocyte %: 7.3 %
Neutrophil #: 22.1 10*3/uL — ABNORMAL HIGH (ref 1.4–6.5)
Neutrophil %: 88.1 %
Platelet: 232 10*3/uL (ref 150–440)
RBC: 4.11 10*6/uL (ref 3.80–5.20)
RDW: 15.3 % — ABNORMAL HIGH (ref 11.5–14.5)
WBC: 25.1 10*3/uL — ABNORMAL HIGH (ref 3.6–11.0)

## 2013-10-11 LAB — BASIC METABOLIC PANEL
Anion Gap: 2 — ABNORMAL LOW (ref 7–16)
BUN: 62 mg/dL — ABNORMAL HIGH (ref 7–18)
CALCIUM: 8.6 mg/dL (ref 8.5–10.1)
CREATININE: 3.56 mg/dL — AB (ref 0.60–1.30)
Chloride: 97 mmol/L — ABNORMAL LOW (ref 98–107)
Co2: 31 mmol/L (ref 21–32)
EGFR (African American): 15 — ABNORMAL LOW
EGFR (Non-African Amer.): 13 — ABNORMAL LOW
GLUCOSE: 193 mg/dL — AB (ref 65–99)
Osmolality: 284 (ref 275–301)
POTASSIUM: 5.1 mmol/L (ref 3.5–5.1)
SODIUM: 130 mmol/L — AB (ref 136–145)

## 2013-10-11 LAB — URINALYSIS, COMPLETE
Bilirubin,UR: NEGATIVE
Blood: NEGATIVE
GLUCOSE, UR: NEGATIVE mg/dL (ref 0–75)
Ketone: NEGATIVE
NITRITE: NEGATIVE
Ph: 5 (ref 4.5–8.0)
Protein: 30
RBC,UR: 3 /HPF (ref 0–5)
Specific Gravity: 1.017 (ref 1.003–1.030)
WBC UR: 11 /HPF (ref 0–5)

## 2013-10-11 LAB — HEMOGLOBIN A1C: Hemoglobin A1C: 8.4 % — ABNORMAL HIGH (ref 4.2–6.3)

## 2013-10-11 LAB — MAGNESIUM: Magnesium: 2 mg/dL

## 2013-10-12 ENCOUNTER — Institutional Professional Consult (permissible substitution): Payer: Medicare Other | Admitting: Pulmonary Disease

## 2013-10-12 LAB — IRON AND TIBC
Iron Bind.Cap.(Total): 229 ug/dL — ABNORMAL LOW (ref 250–450)
Iron Saturation: 9 %
Iron: 20 ug/dL — ABNORMAL LOW (ref 50–170)
Unbound Iron-Bind.Cap.: 209 ug/dL

## 2013-10-12 LAB — CBC WITH DIFFERENTIAL/PLATELET
Basophil #: 0 10*3/uL (ref 0.0–0.1)
Basophil %: 0.2 %
EOS ABS: 0.3 10*3/uL (ref 0.0–0.7)
EOS PCT: 1.5 %
HCT: 25.2 % — ABNORMAL LOW (ref 35.0–47.0)
HGB: 7.6 g/dL — AB (ref 12.0–16.0)
LYMPHS ABS: 1 10*3/uL (ref 1.0–3.6)
Lymphocyte %: 5.6 %
MCH: 21.1 pg — ABNORMAL LOW (ref 26.0–34.0)
MCHC: 30.3 g/dL — ABNORMAL LOW (ref 32.0–36.0)
MCV: 70 fL — ABNORMAL LOW (ref 80–100)
Monocyte #: 1.1 x10 3/mm — ABNORMAL HIGH (ref 0.2–0.9)
Monocyte %: 6.4 %
NEUTROS PCT: 86.3 %
Neutrophil #: 14.8 10*3/uL — ABNORMAL HIGH (ref 1.4–6.5)
PLATELETS: 210 10*3/uL (ref 150–440)
RBC: 3.62 10*6/uL — AB (ref 3.80–5.20)
RDW: 14.9 % — AB (ref 11.5–14.5)
WBC: 17.1 10*3/uL — ABNORMAL HIGH (ref 3.6–11.0)

## 2013-10-12 LAB — BASIC METABOLIC PANEL
ANION GAP: 6 — AB (ref 7–16)
BUN: 65 mg/dL — ABNORMAL HIGH (ref 7–18)
Calcium, Total: 7.6 mg/dL — ABNORMAL LOW (ref 8.5–10.1)
Chloride: 96 mmol/L — ABNORMAL LOW (ref 98–107)
Co2: 30 mmol/L (ref 21–32)
Creatinine: 3.38 mg/dL — ABNORMAL HIGH (ref 0.60–1.30)
EGFR (African American): 16 — ABNORMAL LOW
EGFR (Non-African Amer.): 14 — ABNORMAL LOW
Glucose: 147 mg/dL — ABNORMAL HIGH (ref 65–99)
Osmolality: 286 (ref 275–301)
Potassium: 5.3 mmol/L — ABNORMAL HIGH (ref 3.5–5.1)
Sodium: 132 mmol/L — ABNORMAL LOW (ref 136–145)

## 2013-10-12 LAB — PROTEIN / CREATININE RATIO, URINE
CREATININE, URINE: 93.2 mg/dL (ref 30.0–125.0)
PROTEIN/CREAT. RATIO: 461 mg/g{creat} — AB (ref 0–200)
Protein, Random Urine: 43 mg/dL — ABNORMAL HIGH (ref 0–12)

## 2013-10-12 LAB — PROTEIN ELECTROPHORESIS(ARMC)

## 2013-10-12 LAB — LIPID PANEL
Cholesterol: 162 mg/dL (ref 0–200)
HDL: 22 mg/dL — AB (ref 40–60)
LDL CHOLESTEROL, CALC: 113 mg/dL — AB (ref 0–100)
Triglycerides: 136 mg/dL (ref 0–200)
VLDL Cholesterol, Calc: 27 mg/dL (ref 5–40)

## 2013-10-12 LAB — FERRITIN: FERRITIN (ARMC): 86 ng/mL (ref 8–388)

## 2013-10-13 LAB — CBC WITH DIFFERENTIAL/PLATELET
Basophil #: 0 10*3/uL (ref 0.0–0.1)
Basophil %: 0.4 %
EOS ABS: 0.5 10*3/uL (ref 0.0–0.7)
Eosinophil %: 4.2 %
HCT: 25.2 % — ABNORMAL LOW (ref 35.0–47.0)
HGB: 7.5 g/dL — ABNORMAL LOW (ref 12.0–16.0)
Lymphocyte #: 1 10*3/uL (ref 1.0–3.6)
Lymphocyte %: 8.5 %
MCH: 20.7 pg — ABNORMAL LOW (ref 26.0–34.0)
MCHC: 29.8 g/dL — ABNORMAL LOW (ref 32.0–36.0)
MCV: 69 fL — ABNORMAL LOW (ref 80–100)
MONOS PCT: 8.8 %
Monocyte #: 1 x10 3/mm — ABNORMAL HIGH (ref 0.2–0.9)
Neutrophil #: 9 10*3/uL — ABNORMAL HIGH (ref 1.4–6.5)
Neutrophil %: 78.1 %
Platelet: 247 10*3/uL (ref 150–440)
RBC: 3.62 10*6/uL — AB (ref 3.80–5.20)
RDW: 14.9 % — AB (ref 11.5–14.5)
WBC: 11.5 10*3/uL — AB (ref 3.6–11.0)

## 2013-10-13 LAB — BASIC METABOLIC PANEL
Anion Gap: 4 — ABNORMAL LOW (ref 7–16)
BUN: 54 mg/dL — ABNORMAL HIGH (ref 7–18)
CALCIUM: 7.7 mg/dL — AB (ref 8.5–10.1)
CHLORIDE: 102 mmol/L (ref 98–107)
CO2: 31 mmol/L (ref 21–32)
Creatinine: 2.42 mg/dL — ABNORMAL HIGH (ref 0.60–1.30)
EGFR (Non-African Amer.): 20 — ABNORMAL LOW
GFR CALC AF AMER: 24 — AB
GLUCOSE: 190 mg/dL — AB (ref 65–99)
Osmolality: 294 (ref 275–301)
POTASSIUM: 5 mmol/L (ref 3.5–5.1)
Sodium: 137 mmol/L (ref 136–145)

## 2013-10-14 LAB — BASIC METABOLIC PANEL
Anion Gap: 2 — ABNORMAL LOW (ref 7–16)
BUN: 44 mg/dL — ABNORMAL HIGH (ref 7–18)
Calcium, Total: 8.1 mg/dL — ABNORMAL LOW (ref 8.5–10.1)
Chloride: 105 mmol/L (ref 98–107)
Co2: 29 mmol/L (ref 21–32)
Creatinine: 2.06 mg/dL — ABNORMAL HIGH (ref 0.60–1.30)
EGFR (African American): 29 — ABNORMAL LOW
GFR CALC NON AF AMER: 25 — AB
Glucose: 262 mg/dL — ABNORMAL HIGH (ref 65–99)
Osmolality: 292 (ref 275–301)
Potassium: 5.5 mmol/L — ABNORMAL HIGH (ref 3.5–5.1)
Sodium: 136 mmol/L (ref 136–145)

## 2013-10-14 LAB — UR PROT ELECTROPHORESIS, URINE RANDOM

## 2013-10-15 LAB — CULTURE, BLOOD (SINGLE)

## 2013-10-19 ENCOUNTER — Ambulatory Visit: Payer: Self-pay | Admitting: Internal Medicine

## 2013-10-21 DIAGNOSIS — N183 Chronic kidney disease, stage 3 unspecified: Secondary | ICD-10-CM | POA: Insufficient documentation

## 2013-10-26 ENCOUNTER — Ambulatory Visit: Payer: Self-pay | Admitting: Internal Medicine

## 2013-10-28 LAB — IRON AND TIBC
IRON: 35 ug/dL — AB (ref 50–170)
Iron Bind.Cap.(Total): 323 ug/dL (ref 250–450)
Iron Saturation: 11 %
Unbound Iron-Bind.Cap.: 288 ug/dL

## 2013-10-28 LAB — CBC CANCER CENTER
BASOS ABS: 0.1 x10 3/mm (ref 0.0–0.1)
Basophil %: 0.7 %
Eosinophil #: 0.4 x10 3/mm (ref 0.0–0.7)
Eosinophil %: 4.3 %
HCT: 31.1 % — AB (ref 35.0–47.0)
HGB: 9.4 g/dL — ABNORMAL LOW (ref 12.0–16.0)
LYMPHS ABS: 1.3 x10 3/mm (ref 1.0–3.6)
Lymphocyte %: 13.4 %
MCH: 21.6 pg — AB (ref 26.0–34.0)
MCHC: 30.1 g/dL — ABNORMAL LOW (ref 32.0–36.0)
MCV: 72 fL — ABNORMAL LOW (ref 80–100)
MONOS PCT: 6.9 %
Monocyte #: 0.7 x10 3/mm (ref 0.2–0.9)
Neutrophil #: 7.2 x10 3/mm — ABNORMAL HIGH (ref 1.4–6.5)
Neutrophil %: 74.7 %
Platelet: 375 x10 3/mm (ref 150–440)
RBC: 4.35 10*6/uL (ref 3.80–5.20)
RDW: 18 % — ABNORMAL HIGH (ref 11.5–14.5)
WBC: 9.7 x10 3/mm (ref 3.6–11.0)

## 2013-10-28 LAB — CREATININE, SERUM
CREATININE: 1.53 mg/dL — AB (ref 0.60–1.30)
EGFR (Non-African Amer.): 35 — ABNORMAL LOW
GFR CALC AF AMER: 41 — AB

## 2013-10-28 LAB — FERRITIN: Ferritin (ARMC): 20 ng/mL (ref 8–388)

## 2013-11-10 ENCOUNTER — Ambulatory Visit: Payer: Self-pay | Admitting: Internal Medicine

## 2013-11-16 LAB — CANCER CENTER HEMOGLOBIN: HGB: 8.8 g/dL — AB (ref 12.0–16.0)

## 2013-11-19 DIAGNOSIS — J449 Chronic obstructive pulmonary disease, unspecified: Secondary | ICD-10-CM | POA: Insufficient documentation

## 2013-11-26 LAB — IRON AND TIBC
Iron Bind.Cap.(Total): 310 ug/dL (ref 250–450)
Iron Saturation: 19 %
Iron: 59 ug/dL (ref 50–170)
Unbound Iron-Bind.Cap.: 251 ug/dL

## 2013-11-26 LAB — FERRITIN: Ferritin (ARMC): 11 ng/mL (ref 8–388)

## 2013-11-26 LAB — CANCER CENTER HEMOGLOBIN: HGB: 8.6 g/dL — ABNORMAL LOW (ref 12.0–16.0)

## 2013-11-30 DIAGNOSIS — E119 Type 2 diabetes mellitus without complications: Secondary | ICD-10-CM | POA: Insufficient documentation

## 2013-11-30 DIAGNOSIS — G4733 Obstructive sleep apnea (adult) (pediatric): Secondary | ICD-10-CM | POA: Insufficient documentation

## 2013-11-30 DIAGNOSIS — N189 Chronic kidney disease, unspecified: Secondary | ICD-10-CM | POA: Insufficient documentation

## 2013-11-30 DIAGNOSIS — D72829 Elevated white blood cell count, unspecified: Secondary | ICD-10-CM | POA: Insufficient documentation

## 2013-11-30 DIAGNOSIS — D649 Anemia, unspecified: Secondary | ICD-10-CM | POA: Insufficient documentation

## 2013-11-30 DIAGNOSIS — E785 Hyperlipidemia, unspecified: Secondary | ICD-10-CM | POA: Insufficient documentation

## 2013-12-09 LAB — CBC CANCER CENTER
Basophil #: 0.1 x10 3/mm (ref 0.0–0.1)
Basophil %: 0.7 %
EOS ABS: 0.9 x10 3/mm — AB (ref 0.0–0.7)
Eosinophil %: 8.2 %
HCT: 27.3 % — ABNORMAL LOW (ref 35.0–47.0)
HGB: 8.3 g/dL — AB (ref 12.0–16.0)
Lymphocyte #: 1.8 x10 3/mm (ref 1.0–3.6)
Lymphocyte %: 15.4 %
MCH: 22.5 pg — ABNORMAL LOW (ref 26.0–34.0)
MCHC: 30.2 g/dL — AB (ref 32.0–36.0)
MCV: 74 fL — ABNORMAL LOW (ref 80–100)
Monocyte #: 0.7 x10 3/mm (ref 0.2–0.9)
Monocyte %: 6 %
NEUTROS PCT: 69.7 %
Neutrophil #: 7.9 x10 3/mm — ABNORMAL HIGH (ref 1.4–6.5)
Platelet: 265 x10 3/mm (ref 150–440)
RBC: 3.68 10*6/uL — AB (ref 3.80–5.20)
RDW: 18.6 % — AB (ref 11.5–14.5)
WBC: 11.4 x10 3/mm — AB (ref 3.6–11.0)

## 2013-12-11 ENCOUNTER — Ambulatory Visit: Payer: Self-pay | Admitting: Internal Medicine

## 2014-01-05 ENCOUNTER — Emergency Department: Payer: Self-pay | Admitting: Emergency Medicine

## 2014-01-05 LAB — COMPREHENSIVE METABOLIC PANEL
ALK PHOS: 91 U/L
ALT: 14 U/L
AST: 21 U/L (ref 15–37)
Albumin: 3 g/dL — ABNORMAL LOW (ref 3.4–5.0)
Anion Gap: 9 (ref 7–16)
BUN: 25 mg/dL — ABNORMAL HIGH (ref 7–18)
Bilirubin,Total: 0.2 mg/dL (ref 0.2–1.0)
CALCIUM: 8.6 mg/dL (ref 8.5–10.1)
CHLORIDE: 99 mmol/L (ref 98–107)
CREATININE: 1.42 mg/dL — AB (ref 0.60–1.30)
Co2: 32 mmol/L (ref 21–32)
EGFR (African American): 44 — ABNORMAL LOW
EGFR (Non-African Amer.): 38 — ABNORMAL LOW
GLUCOSE: 277 mg/dL — AB (ref 65–99)
OSMOLALITY: 294 (ref 275–301)
POTASSIUM: 3.5 mmol/L (ref 3.5–5.1)
Sodium: 140 mmol/L (ref 136–145)
Total Protein: 6.9 g/dL (ref 6.4–8.2)

## 2014-01-05 LAB — CBC CANCER CENTER
BASOS ABS: 0.1 x10 3/mm (ref 0.0–0.1)
Basophil %: 0.7 %
EOS PCT: 4.5 %
Eosinophil #: 0.4 x10 3/mm (ref 0.0–0.7)
HCT: 28.9 % — ABNORMAL LOW (ref 35.0–47.0)
HGB: 8.7 g/dL — AB (ref 12.0–16.0)
LYMPHS ABS: 1.4 x10 3/mm (ref 1.0–3.6)
Lymphocyte %: 14.1 %
MCH: 22.2 pg — ABNORMAL LOW (ref 26.0–34.0)
MCHC: 30 g/dL — ABNORMAL LOW (ref 32.0–36.0)
MCV: 74 fL — ABNORMAL LOW (ref 80–100)
MONOS PCT: 5.9 %
Monocyte #: 0.6 x10 3/mm (ref 0.2–0.9)
Neutrophil #: 7.4 x10 3/mm — ABNORMAL HIGH (ref 1.4–6.5)
Neutrophil %: 74.8 %
PLATELETS: 328 x10 3/mm (ref 150–440)
RBC: 3.91 10*6/uL (ref 3.80–5.20)
RDW: 15.3 % — ABNORMAL HIGH (ref 11.5–14.5)
WBC: 9.9 x10 3/mm (ref 3.6–11.0)

## 2014-01-05 LAB — TROPONIN I: Troponin-I: 0.03 ng/mL

## 2014-01-05 LAB — CBC WITH DIFFERENTIAL/PLATELET
BASOS PCT: 0.6 %
Basophil #: 0.1 10*3/uL (ref 0.0–0.1)
EOS ABS: 0.5 10*3/uL (ref 0.0–0.7)
Eosinophil %: 4.5 %
HCT: 29.4 % — AB (ref 35.0–47.0)
HGB: 8.6 g/dL — ABNORMAL LOW (ref 12.0–16.0)
Lymphocyte #: 1.4 10*3/uL (ref 1.0–3.6)
Lymphocyte %: 12.5 %
MCH: 21.6 pg — ABNORMAL LOW (ref 26.0–34.0)
MCHC: 29.3 g/dL — AB (ref 32.0–36.0)
MCV: 74 fL — ABNORMAL LOW (ref 80–100)
MONO ABS: 0.7 x10 3/mm (ref 0.2–0.9)
MONOS PCT: 6.5 %
NEUTROS ABS: 8.7 10*3/uL — AB (ref 1.4–6.5)
Neutrophil %: 75.9 %
Platelet: 333 10*3/uL (ref 150–440)
RBC: 3.98 10*6/uL (ref 3.80–5.20)
RDW: 16.2 % — ABNORMAL HIGH (ref 11.5–14.5)
WBC: 11.4 10*3/uL — AB (ref 3.6–11.0)

## 2014-01-05 LAB — IRON AND TIBC
IRON: 36 ug/dL — AB (ref 50–170)
Iron Bind.Cap.(Total): 316 ug/dL (ref 250–450)
Iron Saturation: 11 %
Unbound Iron-Bind.Cap.: 280 ug/dL

## 2014-01-05 LAB — LIPASE, BLOOD: LIPASE: 434 U/L — AB (ref 73–393)

## 2014-01-05 LAB — FERRITIN: Ferritin (ARMC): 19 ng/mL (ref 8–388)

## 2014-01-11 ENCOUNTER — Ambulatory Visit: Payer: Self-pay | Admitting: Internal Medicine

## 2014-02-11 ENCOUNTER — Ambulatory Visit: Payer: Self-pay | Admitting: Internal Medicine

## 2014-02-11 LAB — CBC CANCER CENTER
Basophil #: 0.1 x10 3/mm (ref 0.0–0.1)
Basophil %: 0.9 %
EOS ABS: 0.5 x10 3/mm (ref 0.0–0.7)
Eosinophil %: 5.9 %
HCT: 34.7 % — AB (ref 35.0–47.0)
HGB: 10.4 g/dL — AB (ref 12.0–16.0)
Lymphocyte #: 1.3 x10 3/mm (ref 1.0–3.6)
Lymphocyte %: 14.6 %
MCH: 22.1 pg — ABNORMAL LOW (ref 26.0–34.0)
MCHC: 30.1 g/dL — ABNORMAL LOW (ref 32.0–36.0)
MCV: 73 fL — ABNORMAL LOW (ref 80–100)
MONOS PCT: 4.5 %
Monocyte #: 0.4 x10 3/mm (ref 0.2–0.9)
Neutrophil #: 6.7 x10 3/mm — ABNORMAL HIGH (ref 1.4–6.5)
Neutrophil %: 74.1 %
PLATELETS: 278 x10 3/mm (ref 150–440)
RBC: 4.72 10*6/uL (ref 3.80–5.20)
RDW: 15.8 % — ABNORMAL HIGH (ref 11.5–14.5)
WBC: 9 x10 3/mm (ref 3.6–11.0)

## 2014-03-13 ENCOUNTER — Ambulatory Visit: Payer: Self-pay | Admitting: Internal Medicine

## 2014-03-16 ENCOUNTER — Ambulatory Visit: Payer: Self-pay | Admitting: Hospice and Palliative Medicine

## 2014-03-17 ENCOUNTER — Ambulatory Visit: Payer: Self-pay | Admitting: Internal Medicine

## 2014-04-04 LAB — CBC CANCER CENTER
BASOS ABS: 0.1 x10 3/mm (ref 0.0–0.1)
Basophil %: 0.7 %
EOS PCT: 5.2 %
Eosinophil #: 0.6 x10 3/mm (ref 0.0–0.7)
HCT: 37 % (ref 35.0–47.0)
HGB: 10.8 g/dL — AB (ref 12.0–16.0)
LYMPHS PCT: 16 %
Lymphocyte #: 1.7 x10 3/mm (ref 1.0–3.6)
MCH: 21.2 pg — AB (ref 26.0–34.0)
MCHC: 29.3 g/dL — AB (ref 32.0–36.0)
MCV: 72 fL — ABNORMAL LOW (ref 80–100)
MONO ABS: 0.6 x10 3/mm (ref 0.2–0.9)
MONOS PCT: 5.4 %
NEUTROS ABS: 7.8 x10 3/mm — AB (ref 1.4–6.5)
NEUTROS PCT: 72.7 %
PLATELETS: 300 x10 3/mm (ref 150–440)
RBC: 5.12 10*6/uL (ref 3.80–5.20)
RDW: 16 % — AB (ref 11.5–14.5)
WBC: 10.7 x10 3/mm (ref 3.6–11.0)

## 2014-04-12 ENCOUNTER — Ambulatory Visit: Payer: Self-pay | Admitting: Internal Medicine

## 2014-05-13 ENCOUNTER — Ambulatory Visit: Payer: Self-pay | Admitting: Internal Medicine

## 2014-06-13 ENCOUNTER — Ambulatory Visit: Payer: Self-pay | Admitting: Internal Medicine

## 2014-09-02 NOTE — Consult Note (Signed)
Chief Complaint:  Subjective/Chief Complaint Looks much better than yest. Alert. On nasal cannula only. No prior EGD/colonoscopy. Admitted to seeing dark stool recently. No other GI sxs.   VITAL SIGNS/ANCILLARY NOTES: **Vital Signs.:   15-May-14 13:20  Pulse Ox % Pulse Ox % 88  Oxygen Delivery 6L; Nasal Cannula   Brief Assessment:  Cardiac Regular   Respiratory clear BS   Gastrointestinal Normal   Lab Results: Routine Chem:  14-May-14 08:06   Glucose, Serum  265  BUN 16  Creatinine (comp)  1.49  Sodium, Serum 138  Potassium, Serum  3.3  Chloride, Serum  94  CO2, Serum  44  Calcium (Total), Serum 8.5  eGFR (African American)  43  eGFR (Non-African American)  37 (eGFR values <23m/min/1.73 m2 may be an indication of chronic kidney disease (CKD). Calculated eGFR is useful in patients with stable renal function. The eGFR calculation will not be reliable in acutely ill patients when serum creatinine is changing rapidly. It is not useful in  patients on dialysis. The eGFR calculation may not be applicable to patients at the low and high extremes of body sizes, pregnant women, and vegetarians.)  Result Comment TOTAL CO2 - RESULTS VERIFIED BY REPEAT TESTING.  - CRITICAL VALUE PREVIOUSLY NOTIFIED.  Result(s) reported on 23 Sep 2012 at 08:40AM.  Anion Gap  0  Osmolality (calc) 286   Assessment/Plan:  Assessment/Plan:  Assessment Anemia. Melena. Transfused. On PPI BID   Plan Discussed doing EGD 1st tomorrow. Pt refused but willing to consider later. Will also need colonoscopy when more stable. Continue PPI BID. Continue to montiner hgb. Will follow. thanks.   Electronic Signatures: OVerdie Shire(MD)  (Signed 15-May-14 13:45)  Authored: Chief Complaint, VITAL SIGNS/ANCILLARY NOTES, Brief Assessment, Lab Results, Assessment/Plan   Last Updated: 15-May-14 13:45 by OVerdie Shire(MD)

## 2014-09-02 NOTE — Discharge Summary (Signed)
PATIENT NAME:  Rhonda Garner, CRANDELL MR#:  [id number] DATE OF BIRTH:  07/08/1945  DATE OF ADMISSION:  12/22/2012 DATE OF DISCHARGE:  12/25/2012  ADMITTING DIAGNOSIS:  Congestive heart failure.   DISCHARGE DIAGNOSES: 1.  Acute on chronic respiratory failure due to congestive heart failure, acute diastolic. 2.  Hypercarbia, hypoxia, atelectasis.  3.  Malignant hypertension.  4.  Diabetes mellitus.  5.  Chronic obstructive pulmonary disease, stable.  6.  Altered mental status due to hypercarbia.  7.  Obesity.  8.  Chronic respiratory failure requiring 3 liters of oxygen through nasal cannula 24/7.  9.  History of hypertension.  10.  Chronic obstructive pulmonary disease.  11.  Diabetes.  12.  Chronic kidney disease, stage III.  DISCHARGE CONDITION: Stable.   DISCHARGE MEDICATIONS: The patient is to resume her outpatient medications, which are: 1.  Acetaminophen 325 mg 2 tablets every 4 hours as needed.  2.  Sertraline 50 mg p.o. daily.  3.  Pravastatin 10 mg p.o. at bedtime.  4.  Senna 1 tablet twice daily as needed.  5.  Glucerna shake 237 mL 3 times daily.  6.  Quinapril 40 mg p.o. twice daily, this is a new dose.  7.  Aspirin 81 mg p.o. daily.  8.  Metoprolol tartrate 50 mg p.o. twice daily, this is a new medication.  9.  Amlodipine 5 mg p.o. once daily. This is a new medication.  10.  Combivent Respimat  1 puff 4 times daily. 11.  Lasix 40 mg tablets 2 tablets in the morning, which is 80 mg in the morning, and 1 tablet which would be 40 mg in p.m.    HOME OXYGEN: At 3 liters of oxygen through nasal cannula 24/7.  DIET:  Low sodium, low fat, low cholesterol, carbohydrate-controlled diet, regular consistency.   ACTIVITY LIMITATIONS: As tolerated.     FOLLOWUP: Appointment with Dr. Mariah Milling in 2 days after discharge. Dr. Welton Flakes, pulmonary, in 1 week after discharge.    CONSULTANTS:  Dr. Freda Munro, Dr. Mariah Milling, social work care management.  RADIOLOGIC STUDIES: Chest x-ray PA and lateral,  12/21/2012, showed bilateral interstitial thickening, bilateral small pleural effusions and right lower lobe airspace disease. Differential diagnosis includes pulmonary edema versus pneumonitis more focally at the right lung base. Repeat chest x-ray done on 12/23/2012, PA and lateral, also showed worsening of appearance in both lungs consistent with interstitial edema and basilar pneumonia. The lateral film reveals the costophrenic angles to be sharp excluding significant pleural effusions. Repeating chest x-ray on 12/25/2012, the results are still pending.   HOSPITAL COURSE:  The patient is a 69 year old Caucasian female with history of COPD, chronic respiratory failure, history of normal ejection fraction according to echocardiogram done in July 2014, presents to the hospital with complaints of shortness of breath, as well as hypoxia. Please refer to Dr. Rob Hickman admission note on 12/22/2012. The patient was complaining of bilateral lower extremity swelling, as well as weight gain of approximately 40 pounds in the past few days, according to the admitting physician. The patient's pulse was 79, respiration rate was 20, blood pressure was not reported. Pulse oximetry was 93% on 3 liters of oxygen per nasal cannula. Physical exam revealed rales as well as rhonchi at the bases in lower lung fields. No wheezing. Moderate air entry was noted. Lower extremity swelling was significant, 3+ edema was noted. The patient's lab data done on the day of admission, 12/21/2012, revealed a glucose of 143, beta-type natriuretic peptide of 10,174, potassium of 3.3,  bicarbonate of 35, otherwise BMP was unremarkable. Creatinine was 1.11. The patient's cardiac enzymes x 3 were within normal limits. TSH was normal at 1.17. White blood cell count was normal at 9.0. Hemoglobin was 8.5. Platelet count was 262. Absolute neutrophil count was 6.8. Urinalysis revealed yellow clear urine. Negative for glucose, bilirubin or ketones. Specific  gravity was 1.018. PH was 5.0. Negative for blood, more than 500 protein. Negative for nitrites or leukocyte esterase. One red blood cell, 1 white blood cell. No bacteria were seen. One epithelial cell. Mucus was present, as well as 3 hyaline casts. Urine cultures showed (Dictation Anomaly) mixed bacterial organisms, results suggestive of contamination. EKG showed normal sinus rhythm at 72 beats per minute. Normal axis. No significant ST-T changes were noted. Chest x-ray, as mentioned above, revealed congestive heart failure. The patient was admitted to the hospital for further evaluation. She was started on IV Lasix and blood pressure medications to control her blood pressure, as her blood pressure was noted to be significantly elevated on admission. According to admission to the floor data, the patient's blood pressure was ranging from around 160s to 170s intermittently, and she required at least 4 liters of oxygen through nasal cannula when she was awake, and she was on BiPAP at nighttime. With this therapy, she improved. By 12/25/2012, her oxygenation improved, and she was down to 4 liters of oxygen through nasal cannula. Her O2 sats were 92% to 94% on 4 liters of oxygen through nasal cannula. It was felt the patient will need to be changed to Lasix orally, by recommendation of Dr. Mariah MillingGollan. The patient's Lasix was changed to 80 mg in the morning and 40 mg in the afternoon, which she was taking in the past. The patient is to follow up with Dr. Mariah MillingGollan for further recommendations. In regards to hypercarbia, we treated it with BiPAP, and she improved. The patient may benefit from sleep study as outpatient, and possibly even CPAP use as outpatient. She is to follow up with Dr. Freda MunroSaadat Khan for further recommendations. In regards to atelectasis, which was noted on the second x-ray, we treated this with incentive spirometry. The patient was more awake and her lung sounds improved; however, we are repeating her chest  x-ray to rule out significant changes. She was afebrile. Her white blood cell count remained stable. Since she was afebrile, and did not have any significant symptoms or signs of infection, we did not initiate any antibiotic therapy. In regards to COPD, we felt it was stable. For history of hypertension, the patient's blood pressure, as mentioned above, was very poorly controlled, so we advanced her medications to current levels. On day of discharge, the patient's blood pressure is still somewhat elevated, remains 150s/70s to 80s. Oxygen saturation remains at around 92% to 95% on 4 liters of oxygen through nasal cannula. Otherwise, her vital signs are stable with pulse being around 80s to 90s and temperature of 98.1. The patient is being discharged in stable condition with the above-mentioned medications and followup.   TIME SPENT: 40 minutes.    ____________________________ Katharina Caperima Karlis Cregg, MD rv:dmm D: 12/25/2012 10:39:00 ET T: 12/25/2012 11:17:55 ET JOB#: [id number]  cc: Antonieta Ibaimothy J. Gollan, MD Yevonne PaxSaadat A. Khan, MD Katharina Caperima Madeline Bebout, MD, <Dictator> Ryelynn Guedea MD ELECTRONICALLY SIGNED 12/30/2012 13:04

## 2014-09-02 NOTE — H&P (Signed)
PATIENT NAME:  Rhonda Garner, Rhonda Garner MR#:  [id number] DATE OF BIRTH:  [birth year]  DATE OF ADMISSION:  11/28/2012  CONTINUATION OF H AND P.  The patient was evaluated by ER physician. ABGs showed a pH of 7.41 with a pCO2 of 87 and a pO2 of 81. Her oxygen saturations at that moment was 50%. The patient was in significant distress, about to be intubated, but they decided to try BiPAP first. The patient apparently tolerated the BiPAP and she was comfortable. But repeated ABG shows worsening on retention of CO2 with a pCO2 of 99. From 73, it went up to 99. Her pO2 remains at 81, and the ACO3 is 5.2. Now she is starting to get acidotic at 7.33. The patient clinically is able to be aroused, and whenever she is aroused, she can communicate, she follows commands for which we are still going to give her a chance and repeat an ABG. If the ABG  continues to be this bad, we are going to proceed with intervention. At this moment, I am going to transfer her to the CCU.   REVIEW OF SYSTEMS:  A 12-system review of systems is unable to be completed as the patient has significant respiratory distress with worsening ABG and I am not able to take her off the BiPAP to talk.   PAST MEDICAL HISTORY:  Reviewed from previous admissions: 1.  Type 2 diabetes.  2.  Hypertension.  3.  COPD.  4.  Chronic respiratory failure requiring 2.5 liters of oxygen nasal cannula.  5.  Chronic kidney disease, stage III.  PAST SURGICAL HISTORY:  Status post cholecystectomy.   SOCIAL HISTORY:  The patient lives by herself at home, has a previous history of smoking, but she quit nine years ago. She used to smoke more than a pack a day. She does not drink. She does not use any drugs.   ALLERGIES:  No known drug allergies.   FAMILY HISTORY:  Positive for diabetes and hypertension on parents.   CURRENT MEDICATIONS:  Include Lasix 20 mg once daily, atenolol 100 mg once daily, Accupril 40 mg once daily. Apparently, the patient was taking Glucotrol,  but she is no longer taking it or at least it is not on her list of the current medications that she gave [place].   PHYSICAL EXAMINATION:  VITAL SIGNS:  Blood pressure 135/55, pulse 68, respirations 32, temperature 97.9, oxygen saturation 57% on nasal cannula, 95% now on BiPAP.  GENERAL:  The patient is severely ill, critically ill with severe respiratory distress, tachypneic. The patient is on a BiPAP right now, not able to communicate very well.  HEENT:  Her pupils are equal and reactive. Extraocular movements are intact. She follows commands. Anicteric sclerae. Pink conjunctivae. No oral lesions. There is no significant tongue swelling or thrush. Mucosae are dry.  NECK:  Supple. No JVD. No thyromegaly. No adenopathy. No carotid bruits.  CARDIOVASCULAR:  Regular rate and rhythm. No murmurs, rubs or gallops are appreciated. No displacement of PMI. No tenderness to palpation anterior chest wall.  LUNGS:  Clear on apex. Bases have some crackling and then there is diffuse rhonchi. I do not hear any wheezing right now, but the patient was wheezing at presentation. Positive use of accessory muscles. No dullness to percussion.  ABDOMEN:  Soft, nontender, nondistended. No hepatosplenomegaly. No masses. Bowel sounds are positive.  GENITAL EXAM:  Deferred.  EXTREMITIES:  No edema, cyanosis or clubbing. There is no edema, no water retention.  VASCULAR:  Pulses +2.  Capillary refill less than 3. Her strength is 5/5 in 4 extremities.  NEUROLOGIC:  Cranial nerves II through XII intact. The patient follows commands. She is lethargic but she is arousable. Once she is aroused, she follows commands and communicate with writing some, but she is very weak right now. Strength is equal in four extremities. Sensation is equal in 4 extremities.  PSYCHIATRIC:  The patient does not seem agitated; actually, she is lethargic. She was alert, and oriented x 3 at admission.  SKIN:  No rashes, petechiae or diaphoresis.   MUSCULOSKELETAL:  No significant joint effusions or joint swelling.  LYMPHATICS:  Negative for lymphadenopathy in neck or supraclavicular areas.   LABORATORY, DIAGNOSTIC, AND RADIOLOGICAL DATA:  Glucose 276, BUN 27, creatinine 1.48, potassium 3.2. The CO2 is 42, GFR 37. By the way, the patient has also chronic kidney disease. Albumin 2.9. Troponins are negative. White blood cells of 12.5, hemoglobin is 7.2, platelet count is 338. pH is 7.41 on admission, 7.33 now, pCO2 at admission 87, now 99, pO2 81.   CHEST X-RAY:  Possible perihilar infiltrates, likely secondary to pneumonitis. Also a possibility of pulmonary edema. Clinically, patient does not seem to be fluid overloaded.   EKG:  No ST depression or elevation. She is in normal sinus rhythm.   ASSESSMENT AND PLAN:  1.  Acute respiratory failure. The patient has this secondary to chronic obstructive pulmonary disease, likely she has also pneumonitis versus beginning of pneumonia. The patient has bilateral infiltrates, considering the possibility of some fluid overload. Clinically she looks dry, but we are going to go ahead and get a BMP. I am going to order an echocardiogram as well as the patient has been one. And we are going to give her Lasix if necessary. The patient is on the BiPAP. Her ABG has worsened despite the BiPAP. We are going to give her one more chance since the patient is still awake and following commands whenever she is aroused. If any worsening or not significant improvement, we are going to intubate her. The patient is going to the Critical Care Unit. Her oxygen saturation was 57%.  2.  Systemic inflammatory response syndrome, likely secondary to pneumonitis. The patient does not have a fever, but she was very tachypnea and has elevation of white blood cells and changes in her mental status, very lethargic, likely due to CO2. The patient started on Levaquin.  3.  Chronic obstructive pulmonary disease exacerbation. The patient on  Levaquin, nebulizers, add on inhalers with steroids and beta-agonist like Flovent. The patient no longer smokes. Continue nebs as needed. Supply oxygen. The patient going to the Critical Care Unit.  4.  Acute hypercarbic respiratory failure. This is acute on chronic failure due to her chronic obstructive pulmonary disease with worsening CO2 retention and severe hypoxemia with a pO2 of 80 and oxygen saturation in the 50s.  5.  Anemia. The patient has chronic disease anemia likely due to her chronic kidney disease. Her hemoglobin has been around 7 in the past. Right now it is 7.2 from 8.3 on the last. We are not going to transfuse unless continues to drop. Get iron levels and replace iron IV as necessary.  6.  Chronic kidney disease. The patient is at baseline. The patient takes Lasix. She looks dry today. We are going to give her a little bit of fluids. If her BNP comes up elevated, we are going to stop the fluids and give her some Lasix.  7.  Hypokalemia. Replace  potassium.  8.  Diabetes. The patient has no treatment at this moment, uncontrolled diabetes. Get a hemoglobin A1c, put her on insulin sliding scale. Her blood sugars are to be higher because of the steroids. Monitor closely.  9.  Hypertension is stable.  10.  Deep vein thrombosis prophylaxis with Lovenox, adrenal dose.  11.  Gastrointestinal prophylaxis with Protonix.   CRITICAL CARE TIME:  About 60 minutes.  CODE STATUS:  THE PATIENT IS A FULL CODE.  ____________________________ Felipa Furnaceoberto Sanchez Gutierrez, MD rsg:jm D: 11/28/2012 11:00:40 ET T: 11/28/2012 11:30:26 ET JOB#: [id number]  cc: Felipa Furnaceoberto Sanchez Gutierrez, MD, <Dictator> Miyo Aina Juanda ChanceSANCHEZ GUTIERRE MD ELECTRONICALLY SIGNED 12/06/2012 0:37

## 2014-09-02 NOTE — H&P (Signed)
PATIENT NAME:  [name], Rhonda MR#:  [id number] DATE OF BIRTH:  [birth year]  DATE OF ADMISSION:  11/28/2012  REFERRING PHYSICIAN:  Dr. Malachy Moanevainder Goli.   [facility]IC[facility].   [name] is a very nice 69 year old female who was recently admitted over here on 09/23/2012 with hyperglycemia. The patient had a history of COPD, diabetes, hypertension, chronic respiratory failure on 2 liters of oxygen nasal cannula who has been in her normal state of health up until last night. Five hours before coming to the ER, the patient was having significant shortness of breath. The patient was sitting in the tripod position, not able to talk, not able to breathe very well. The patient states that she did not have any chest pain or pain of the lower extremities. The shortness of breath was described as severe, and at this moment she is not able to talk and give me information since she is on a BiPAP and having significant shortness of breath. I do not want to take her off the BiPAP as the patient has worsening on her ABG from the previous despite the fact that she was already on the BiPAP. The patient is in significant respiratory distress. She was a former smoker. She quit over 9 years ago.  ____________________________ Felipa Furnaceoberto Sanchez Gutierrez, MD rsg:jm D: 11/28/2012 10:46:55 ET T: 11/28/2012 11:08:19 ET JOB#: [id number]  cc: Felipa Furnaceoberto Sanchez Gutierrez, MD, <Dictator> Argusta Mcgann Juanda ChanceSANCHEZ GUTIERRE MD ELECTRONICALLY SIGNED 12/06/2012 0:37

## 2014-09-02 NOTE — Discharge Summary (Signed)
PATIENT NAME:  Rhonda Garner, Rhonda Garner MR#:  [id number] DATE OF BIRTH:  April 06, 1946  DATE OF ADMISSION:  11/28/2012 DATE OF DISCHARGE:  12/09/2012   Addendum  Addendum to previously dictated discharge summary by Dr. Auburn BilberryShreyang Patel on 25th of July. All the Discharge diagnosis remains same per Dr Eliane DecreePatel's summary.  HOSPITAL COURSE: The patient stayed longer as she kept getting hypercarbic and hypoxic intermittently while in the hospital and required BiPAP intermittently. She would respond well to BiPAP with good mental status the next day. She was followed by palliative care, and they agreed with the management.  Pulmonary also followed the patient and agreed with continuing BiPAP as needed. This morning, the patient was wide awake, as per the nursing. When I saw the patient, the patient was falling asleep and gets hypersomnolent, which seemed to be her normal way of sleeping and awake cycle. She did have similar spell the day before yesterday and responded well to BIPAP and was wide awake yesterday all day.  She is close to her baseline and is being discharged back to [facility] today in stable condition.  PERTINENT PHYSICAL EXAMINATION ON THE DATE OF DISCHARGE:  VITAL SIGNS: As follows: Temperature 97.8, heart rate 80 per minute, respirations 20 per minute, blood pressure 125/76 mmHg. She is saturating 96% on 2 liters oxygen via nasal cannula.  CARDIOVASCULAR: S1, S2 normal. No murmur, rubs or gallop.  LUNGS: Clear to auscultation bilaterally. No wheezing, rales, rhonchi or crepitation.  ABDOMEN: Soft, benign.  NEUROLOGIC: Nonfocal examination.  All other physical examination remained at baseline.   DISCHARGE MEDICATIONS: 1. Lasix 20 mg p.o. daily.  2. Accupril 40 mg p.o. daily. 3. Atenolol 100 mg p.o. daily, to hold if systolic pressure less than 100.  4. Prednisone 60 mg p.o. daily, taper 10 mg daily until finished.  5. Tylenol 650 mg p.o. every 4 hours as needed.  6. Sertraline 50 mg p.o. daily.   7. Sliding scale insulin every 6 hours.  8. Pravastatin 20 mg p.o. q.h.s.  9. DuoNeb inhaled every 4 hours as needed.  10. Senna 1 tablet p.o. b.i.d. as needed.  11. Budesonide 1 inhalation twice a day.  12. Glucerna shake orally 3 times a day.   DISCHARGE DIET: Regular.   DISCHARGE ACTIVITY: As tolerated.   DISCHARGE INSTRUCTIONS AND FOLLOWUP: The patient was instructed to follow up with her primary care physician at [facility]. She will need followup with [facility] in 2 to 3 weeks. She was instructed to use 2 liters oxygen via nasal cannula continuous and as needed. She was also instructed to use BiPAP as needed while at [facility] as this helps with her underlying sleep apnea and hypercarbia, which makes her hypersomnolent.   TOTAL TIME DISCHARGING THIS PATIENT: 55 minutes.   COPY INSTRUCTIONS: Please send a copy of this addendum along with Dr. Serita GritShreyang Patel's dictated discharge summary on the 25th of July to physicians listed below.    ____________________________ Ellamae SiaVipul S. Sherryll BurgerShah, MD vss:OSi D: 12/09/2012 12:57:16 ET T: 12/09/2012 14:25:24 ET JOB#: [id number]  cc: Takeria Marquina S. Sherryll BurgerShah, MD, <Dictator> [facility] Dory LarsenKurian D. Kasa, MD [facility] Ned GraceNancy Phifer, MD Ellamae SiaVIPUL S [facility] MD ELECTRONICALLY SIGNED 12/10/2012 9:19

## 2014-09-02 NOTE — Consult Note (Signed)
Chief Complaint:  Subjective/Chief Complaint doing well. she had CHF and CXR is c/w heart failure. She feels better now she does not have symptoms to suggest pneumonia at this time   VITAL SIGNS/ANCILLARY NOTES: **Vital Signs.:   15-Aug-14 07:27  Vital Signs Type Routine  Temperature Temperature (F) 98.1  Celsius 36.7  Temperature Source oral  Pulse Pulse 86  Respirations Respirations 18  Systolic BP Systolic BP 774  Diastolic BP (mmHg) Diastolic BP (mmHg) 77  Mean BP 103  Pulse Ox % Pulse Ox % 92  Pulse Ox Activity Level  At rest  Oxygen Delivery 4L  *Intake and Output.:   Shift 15-Aug-14 15:00  Grand Totals Intake:  240 Output:  500    Net:  -260 24 Hr.:  -260  Oral Intake      In:  240  Urine ml     Out:  500  Length of Stay Totals Intake:  1440 Output:  8000    Net:  -6560   Brief Assessment:  GEN no acute distress   Cardiac Regular  -- thrills  -- JVD   Respiratory normal resp effort  clear BS  no use of accessory muscles   Gastrointestinal details normal Soft  Bowel sounds normal  No organomegaly   EXTR negative cyanosis/clubbing   Lab Results: Hepatic:  13-Aug-14 04:44   Bilirubin, Total 0.3  Alkaline Phosphatase 85  SGPT (ALT) 18  SGOT (AST) 15  Total Protein, Serum  5.5  Albumin, Serum  2.5  Routine Chem:  13-Aug-14 04:44   Glucose, Serum  153  BUN 10  Creatinine (comp) 1.24  Sodium, Serum 143  Potassium, Serum 3.7  Chloride, Serum 101  CO2, Serum  38  Calcium (Total), Serum 8.7  Anion Gap  4  Osmolality (calc) 287  eGFR (African American)  53  eGFR (Non-African American)  46 (eGFR values <89m/min/1.73 m2 may be an indication of chronic kidney disease (CKD). Calculated eGFR is useful in patients with stable renal function. The eGFR calculation will not be reliable in acutely ill patients when serum creatinine is changing rapidly. It is not useful in  patients on dialysis. The eGFR calculation may not be applicable to patients at the  low and high extremes of body sizes, pregnant women, and vegetarians.)  Cholesterol, Serum 139  Triglycerides, Serum 101  HDL (INHOUSE) 40  VLDL Cholesterol Calculated 20  LDL Cholesterol Calculated 79 (Result(s) reported on 23 Dec 2012 at 07:22AM.)  Routine Hem:  13-Aug-14 04:44   Hemoglobin (CBC)  8.3  WBC (CBC) 8.7  RBC (CBC)  3.76  Hematocrit (CBC)  26.7  Platelet Count (CBC) 249  MCV  71  MCH  22.0  MCHC  31.1  RDW  24.8  Neutrophil % 81.0  Lymphocyte % 8.4  Monocyte % 5.8  Eosinophil % 4.4  Basophil % 0.4  Neutrophil #  7.0  Lymphocyte #  0.7  Monocyte # 0.5  Eosinophil # 0.4  Basophil # 0.0 (Result(s) reported on 23 Dec 2012 at [facility])   Radiology Results: XRay:    13-Aug-14 15:47, Chest PA and Lateral  Chest PA and Lateral   REASON FOR EXAM:    hypoxia, poor ressponce to diuretics, remains very   hypoxic  COMMENTS:       PROCEDURE: DXR - DXR CHEST PA (OR AP) AND LATERAL  - Dec 23 2012  3:47PM     RESULT: Comparison is made to the study of December 21, 2012.  The lungs are adequately inflated. The interstitial markings are more   prominent today. The pulmonary vascularity remains engorged. The right   and left left hemidiaphragms remain obscured and bibasilar atelectasis   versus pneumonia is present.    IMPRESSION:  There has been some worsening in the appearance of both   lungs consistent with interstitial edema and bibasilar pneumonia. The     lateral film reveals the costophrenic angles to be sharp excluding   significant pleural effusions.     Dictation Site:2        Verified By: DAVID A. Martinique, M.D., MD   Assessment/Plan:  Assessment/Plan:  Assessment 1. Acute pulmonary edema secondary to CHF -agree with present management -she may need a full pulmonary workup for ?COPD and I would be happy to see her in the office after discharge   Electronic Signatures: Allyne Gee (MD)  (Signed 15-Aug-14 10:17)  Authored: Chief Complaint, VITAL  SIGNS/ANCILLARY NOTES, Brief Assessment, Lab Results, Radiology Results, Assessment/Plan   Last Updated: 15-Aug-14 10:17 by Allyne Gee (MD)

## 2014-09-02 NOTE — Discharge Summary (Signed)
PATIENT NAME:  Rhonda Garner, Rhonda Garner MR#:  [id number] DATE OF BIRTH:  08-Apr-1946  DATE OF ADMISSION:  09/23/2012 DATE OF DISCHARGE:  09/26/2012  DISCHARGE DIAGNOSES: 1. Acute respiratory failure due to chronic obstructive pulmonary disease exacerbation and pneumonia.  2.  Diabetes.  3.  Hypertension.  4.  Gastrointestinal bleed.  5.  Occult blood loss.   6.  Leukocytosis.   CONDITION ON DISCHARGE: Stable.   CODE STATUS ON DISCHARGE: FULL CODE.   MEDICATIONS ON DISCHARGE:  1.  Glipizide 10 mg once a day.  2.  Prednisone 10 mg oral tablet, start with 60 mg and taper by 10 mg until complete.  3.  Advair Diskus 1 puff inhaled 2 times a day.  4.  Spiriva 18 mcg inhalation capsule once a day.  5.  Ceftin 500 mg oral tablet 2 times a day for 4 days.  6.  Pantoprazole 40 mg delayed-release tablet 2 times a day.  7.  Furosemide 20 mg oral tablet once a day.   HOME OXYGEN ON DISCHARGE: Yes.    HOME HEALTH: No.   PORTABLE TANK: Yes, 2 liters nasal cannula oxygen supplementation.   DIET ON DISCHARGE: Low sodium, low fat, low cholesterol, carbohydrate -controlled ADA diet, regular consistency.   TIMEFRAME TO FOLLOW-UP: Within 1 to 2 weeks with Dr. Meredeth Ide and 1 to 2 weeks with GI Clinic. Advised to use BiPAP, 12/6 rate of 12 at night.  Advised to have outpatient sleep study in clinic with Dr. Meredeth Ide.   HISTORY OF PRESENT ILLNESS: This 69 year old female presented for the first time to hospital with extensive past medical history, an extremely poor historian, diabetes, hypertension, chronic obstructive pulmonary disease on 2.5 liters nasal cannula oxygen supplementation chronically, presented with hyperglycemia. She did check her blood sugar, which was more than 400. She came to the Emergency Department, found to have finger stick of 211 but appeared confused and lethargic. She had significant wheezing, so ABG was done, which showed to be hypercarbic, hypoxic respiratory failure. PCO2 was 92 in ER and  pO2 was 59. The patient was started on BiPAP with significant improvement in her mentation.  Her blood work was also showing multiple abnormalities with potassium of 2.5, creatinine was 1.48 and leukocytosis of 15.1. Hemoglobin was 5.7. The patient's Hemoccult blood was positive in the ER. She was also hypotensive in the ER.    HOSPITAL COURSE AND STAY:  1.  The patient felt better on BiPAP and then 3 to 4 liters nasal cannula oxygen supplementation gradually with her shortness of breath. On treatment of chronic obstructive pulmonary disease exacerbation with IV steroids and nebulizer treatment, she had improvement gradually. Pulmonary consult with Dr. Meredeth Ide was done and he suggested to go home with BiPAP at night and do a sleep study as outpatient, which patient and agreed with. She was discharged with tapering dose of steroids for chronic obstructive pulmonary disease and antibiotics.  2.  Anemia, gastrointestinal bleed.  Hemoccult blood was positive. Protonix was given orally twice a day. She received 1 unit transfusion in the hospital because of low hemoglobin. Gastroenterology consult was done and they suggested to have work-up as outpatient, as she did not have actual bleeding or hemoglobin drop in the hospital  3.  Pneumonia. She was on Rocephin and Zithromax. Sputum culture was ordered, but she was not able to give the sputum sample, but she seemed to medically improve with the treatment and discharged on Ceftin at home.  4.  Diabetes. She will continue glipizide  and sliding scale insulin coverage in the hospital.  5.  Hypertension. Norvasc started after first day and remained stable blood pressure.  6.  Hypokalemia. Replaced, and came up and stable.  7.  Leukocytosis. Started trending down with treatment of pneumonia.  8.  Acute on chronic renal failure. We stopped Lasix, which was started initially and then her creatinine remained stable.   CONSULTANTS IN THE HOSPITAL: Dr. Meredeth IdeFleming for  pulmonary consult. Dr. Bluford Kaufmannh for GI consult.   IMPORTANT LABORATORY RESULTS IN THE HOSPITAL: Chest x-ray on presentation suggestive of congestive heart failure with mild interstitial edema; cannot exclude small pleural effusion. WBC count was 15,000 on presentation, hemoglobin was 5.7, platelet count was 393. Creatinine was 1.48 on presentation with potassium 2.5. Bicarbonate level on BMP was 45. ABG on presentation:  pH 7.43, pCO2 of 82 and pO2 was 59 on 2.5 liters nasal cannula supplementation oxygen. Blood culture: No growth.   Potassium was corrected the next day, up to 3.6. TSH was 0.319, magnesium was 2. Creatinine came up to 1.73 and then 1.49 on discharge.   TOTAL TIME SPENT ON THIS DISCHARGE SUMMARY: 45 minutes.   ____________________________ Hope PigeonVaibhavkumar G. Elisabeth PigeonVachhani, MD vgv:cc D: 09/30/2012 21:22:04 ET T: 09/30/2012 23:07:41 ET JOB#: [id number]  cc: Hope PigeonVaibhavkumar G. Elisabeth PigeonVachhani, MD, <Dictator> Herbon E. Meredeth IdeFleming, MD Ezzard StandingPaul Y. Bluford Kaufmannh, MD  Altamese DillingVAIBHAVKUMAR Danzell Birky MD ELECTRONICALLY SIGNED 10/08/2012 14:16

## 2014-09-02 NOTE — H&P (Signed)
PATIENT NAME:  Rhonda Garner, PIONTEK MR#:  [id number] DATE OF BIRTH:  1946/01/18  DATE OF ADMISSION:  09/23/2012  REFERRING PHYSICIAN:  Dr. Chiquita Loth.   PRIMARY CARE PHYSICIAN:  Following at [facility].   CHIEF COMPLAINT:  Hyperglycemia.   HISTORY OF PRESENT ILLNESS:  This is a 69 year old female who presents for the first time to [facility], with extensive past medical history, who is an extremely poor historian with known past medical history of diabetes, hypertension, COPD, on chronic respiratory failure on 2.5 liters nasal cannula due to her COPD presents with complaint of hyperglycemia, the patient reports she was at home where she did check her finger glucose, where it was in the 400s, where then she came to ED, in ED patient was found to have fingersticks of 211, but she was appeared to be confused and lethargic, and she had significant wheezing, so patient had an ABG done which did show her to be in hypercarbic hypoxic respiratory failure with pCO2 of 92 and pO2 of 59, patient was started on BiPAP, with significant improvement of her mentation, the patient had her blood work done which was significant for multiple abnormalities, mainly her potassium being 2.5, and her creatinine being 1.48, and had significant leukocytosis of 15.1 and her hemoglobin being at 5.7, unfortunately this is her first time here and we do not have her baseline, we requested her medical records from [facility], but nothing sent yet at the time of this dictation, the patient was Hemoccult-positive, but did not have any significant melena or bright red blood per rectum or significant coffee-ground emesis or hematemesis, the patient's blood pressure was on the higher side, she was very hypotensive in ED, the patient denies any recent use of NSAIDs, as well she reports she is on aspirin, but she cannot recall the dose, the patient has been complaining of cough, nonproductive, nonbloody, she denies any fever, chills,  sweating, chest pain, chest discomfort, any palpitations, calf pain, dizziness, lightheadedness, or anxiety, had only mild complaints of headache.   PAST MEDICAL HISTORY: 1.  Diabetes.  2.  Hypertension.  3.  Chronic obstructive pulmonary disease.  4.  Chronic respiratory failure on 2.5 liters nasal cannula at home.   PAST SURGICAL HISTORY:  Cholecystectomy.   SOCIAL HISTORY:  The patient lives at home, has had remote history of smoking, quit before 8 years, no alcohol or illicit drug use.   FAMILY HISTORY:  Significant for diabetes mellitus and hypertension in the family.   ALLERGIES:  No known drug allergies.   HOME MEDICATIONS:  The patient cannot recall her meds, but she only remembers she is on atenolol, Lasix, Glucotrol, aspirin, but cannot recall any of those dosages.   REVIEW OF SYSTEMS:  CONSTITUTIONAL:  The patient denies fever, chills, fatigue, weakness, weight loss, weight gain.  EYES:  Denies blurry vision, double vision, pain, inflammation or glaucoma.  EARS, NOSE, THROAT:  Denies tinnitus, ear pain, hearing loss or epistaxis.  RESPIRATORY:  Complains of cough, but denies hemoptysis, painful respiratory.  Has history of COPD on chronic oxygen at home.   CARDIOVASCULAR:  Denies chest pain, edema, arrhythmia, palpitations, syncope.  GASTROINTESTINAL:  Denies nausea, vomiting, diarrhea, abdominal pain, hematemesis, coffee-ground emesis, melena or bright red blood per rectum.  GENITOURINARY:  Denies dysuria, hematuria, renal colic.  ENDOCRINE:  Denies polyuria, polydipsia, heat or cold intolerance.  HEMATOLOGY:  Denies easy bruising, bleeding diathesis.  INTEGUMENTARY:  Denies acne, rash or skin lesions.  MUSCULOSKELETAL:  Denies  gout, limited activity or cramps.  NEUROLOGIC:  Denies CVA, TIA, seizures, ataxia, vertigo, tremors.  Has complaints of headaches.  PSYCHIATRIC:  Denies anxiety, insomnia, bipolar disorder or schizophrenia, substance or alcohol abuse.   PHYSICAL  EXAMINATION: VITAL SIGNS:  Temperature 98.3, pulse 66, respiratory rate 37, blood pressure 172/76, saturating 86% on BiPAP.  GENERAL:  Obese female, looks comfortable in bed in no apparent distress, wearing BiPAP, but she is comfortable.  HEENT:  Head atraumatic, normocephalic.  Pupils equal, reactive to light, pale conjunctivae.  Anicteric sclerae.  Dry oral mucosa.  NECK:  Supple.  No thyromegaly.  No JVD.  CHEST:  The patient had fair air entry bilaterally with decreased air movement, but no rales or rhonchi.  Has mild wheezing.  CARDIOVASCULAR:  S1, S2 heard.  No rubs, murmurs, or gallops.  ABDOMEN:  Obese, soft, nontender, nondistended.  Bowel sounds present.  EXTREMITIES:  No edema.  No clubbing.  No cyanosis.  Dorsalis pedis pulse +2 bilaterally.  PSYCHIATRIC:  Appropriate affect.  Awake, alert x 3.  Intact judgment and insight.  NEUROLOGIC:  Cranial nerves grossly intact.  Motor 5 out of 5.  No focal deficits.  SKIN:  Normal skin turgor.  Warm and dry.   PERTINENT LABORATORY DATA:  Glucose 215, BUN 15, creatinine 1.48, sodium 138, potassium 2.5, chloride 92, CO2 45.  Troponin less than 0.02.  White blood cell 15.1, hemoglobin 5.7, hematocrit 20.3, platelets 393.  ABG showing pH of 7.43, pCO2 of 82, pO2 of 59.  Chest x-ray does show right lung infiltrate.   ASSESSMENT AND PLAN: 1.  Acute on chronic respiratory failure, the patient is known to have chronic obstructive pulmonary disease with chronic respiratory failure on 2.5 liters nasal cannula, currently appears to be on hypercarbic and hypoxic respiratory failure.  This is the most likely due to chronic obstructive pulmonary disease exacerbation and pneumonia.  She is improving on BiPAP.  We will continue her on BiPAP.  2.  Anemia with hemoglobin of 5.7, this is most likely due to gastrointestinal bleed as she is Hemoccult-positive.  Denies any NSAID use, but as well, what is her hemoglobin at baseline, the patient be kept nothing by mouth,  will be transfused 2 units packed red blood cells.  We will continue to check her hemoglobin every 8 hours.  We will start her on Protonix drip after giving 80 mg IV bolus, we will continue to check her hemoglobin every 8 hours and we will consult gastroenterology, we will try to obtain her records for the last year from [facility] to see if she has any recent gastrointestinal studies done.  3.  Chronic obstructive pulmonary disease exacerbation, continue on DuoNebs.  The patient received one dose of Solu-Medrol, we will start her on lower dose Solu-Medrol due to possible gastrointestinal bleed and unclear if she is having any gastritis or gastric ulcer.  4.  Pneumonia.  The patient has right lung infiltrate.  Will be kept on Rocephin and Zithromax.  We will follow on urine culture and sputum culture.  5.  Leukocytosis, this is most likely related to pneumonia, but we will check urinalysis as well.  6.  Diabetes mellitus.  The patient will be started on insulin sliding scale.  We will hold her oral agents.  7.  Hypertension.  The patient's blood pressure is actually uncontrolled, but secondary to her anemia and possible gastrointestinal bleed, we will hold on meds until she is more stable.  8.  Hypokalemia.  We will  admit patient to telemetry unit, we will continue to replace aggressively and we will check level in a.m.  We will check with magnesium as well.  9.  Renal failure, it is unclear if it is acute or chronic.  We will [facility]  We will continue with hydration.  10.  Gastrointestinal prophylaxis.  Sequential compression device.  No chemical anticoagulation secondary to her anemia with possible gastrointestinal bleed.  11.  CODE STATUS:  FULL CODE.   Total time spent on admission and patient care 65 minutes.    ____________________________ Starleen Armsawood S. Kyrel Leighton, MD dse:ea D: 09/23/2012 03:08:18 ET T: 09/23/2012 03:48:25 ET JOB#: [id number]  cc: Starleen Armsawood S. Jirah Rider, MD,  <Dictator> Caidence Higashi Teena IraniS Naomie Crow MD ELECTRONICALLY SIGNED 10/01/2012 0:29

## 2014-09-02 NOTE — Consult Note (Signed)
Chief Complaint:  Subjective/Chief Complaint Feels better and wants to go home but hgb dropped today. To receive blood transfusion today.   VITAL SIGNS/ANCILLARY NOTES: **Vital Signs.:   16-May-14 03:56  Vital Signs Type Routine  Celsius 37  Temperature Source oral  Pulse Pulse 94  Respirations Respirations 18  Systolic BP Systolic BP 134  Diastolic BP (mmHg) Diastolic BP (mmHg) 65  Mean BP 88  Pulse Ox % Pulse Ox % 91  Pulse Ox Activity Level  At rest  Oxygen Delivery 6L    07:56  Vital Signs Type Routine  Temperature Temperature (F) 98.6  Celsius 37  Temperature Source oral  Pulse Pulse 82  Respirations Respirations 22  Systolic BP Systolic BP 135  Diastolic BP (mmHg) Diastolic BP (mmHg) 72  Mean BP 93  Pulse Ox % Pulse Ox % 91  Pulse Ox Activity Level  At rest  Oxygen Delivery 4L   Brief Assessment:  Cardiac Regular   Respiratory normal resp effort  clear BS   Gastrointestinal Normal   Lab Results: Lab:  15-May-14 04:10   pH (ABG)  7.31  PCO2  103  PO2  63  FiO2 45  Base Excess  20.0  HCO3  51.9  O2 Saturation 91.6  O2 Device bipap 10/5  Specimen Site (ABG) RT RADIAL  Specimen Type (ABG) ARTERIAL  Patient Temp (ABG) 37.0  Routine Chem:  15-May-14 04:10   Result Comment - CONSISTENT WITH PREVIOUS RESULTS.  Result(s) reported on 24 Sep 2012 at 04:30AM.   Assessment/Plan:  Assessment/Plan:  Assessment GI bleeding. Anemia. Heme positive stool. Poss bleeding ulcer.   Plan Continue PPI daily. If stable, ok for discharge over the weekend. Make sure patient is discharged over the weekend on daily PPI. Will need EGD and colon arranged as outpt. Will have Dr. Mechele CollinElliott patient over the weekend. If pt's hgb keeps falling, then will plan EGD +/- colonoscopy on Monday if resp status stable. Thanks.   Electronic Signatures: Lutricia Feilh, Lucillie Kiesel (MD)  (Signed 16-May-14 10:19)  Authored: Chief Complaint, VITAL SIGNS/ANCILLARY NOTES, Brief Assessment, Lab Results,  Assessment/Plan   Last Updated: 16-May-14 10:19 by Lutricia Feilh, Chantella Creech (MD)

## 2014-09-02 NOTE — H&P (Signed)
PATIENT NAME:  Rhonda Garner, Rhonda Garner DATE OF BIRTH:  07-Sep-1945  DATE OF ADMISSION:  12/22/2012  PRIMARY CARE PHYSICIAN: Nonlocal.   REFERRING PHYSICIAN: Dr. Cyril LoosenKinner.   CHIEF COMPLAINT: Shortness of breath and hypoxia.    HISTORY OF PRESENT ILLNESS: The patient is a 69 year old morbidly obese female with a past medical history of COPD and chronic respiratory failure and lives on 3 liters of oxygen and needs to use BiPAP as needed, hypertension, diabetes mellitus. She is reporting that she has been short of breath for the past few days and it has been worse today. She could not breathe and became hypoxemic, and her pulse ox was 85% here in the ER. The patient denies any chest pain but is reporting that she gained significant weight of 40 pounds in the past few days. Her bilateral lower extremities are swollen, and here in the ER, BNP is elevated. Chest x-ray did not reveal a significant change from the previous x-ray, but the patient is clinically in heart failure. Recent echocardiogram done in July has revealed a left ventricular ejection fraction of 55% to 60%. The patient is also reporting that for the past few days she has been using 3 liters of oxygen, and she usually lives on 3 liters at home. The patient has received aspirin and Lasix 40 mg IV in the ER, and hospitalist team is called to admit the patient.   PAST MEDICAL HISTORY: Hypertension, COPD, chronic respiratory failure and lives on 2 liters of oxygen via nasal cannula, diabetes mellitus, chronic renal insufficiency stage III.   PAST SURGICAL HISTORY: Status post cholecystectomy.   ALLERGIES: No known drug allergies.    PSYCHOSOCIAL HISTORY: Lives at home, lives alone. Used to smoke but quit smoking. Denies alcohol or illicit drug use.    FAMILY HISTORY: Diabetes and hypertension in both parents.    HOME MEDICATIONS: Sertraline 50 mg once daily, pravastatin 20 mg once daily, furosemide 20 mg once daily for lower extremity  swelling, atenolol 100 mg once a day, Accupril 40 mg once daily, acetaminophen 325 mg every 4 hours on as needed basis.   REVIEW OF SYSTEMS:  CONSTITUTIONAL: Denies any fever. Complaining of fatigue and weakness.  EYES: Denies blurry vision or glaucoma.  EARS, NOSE, THROAT: No epistaxis or discharge.  RESPIRATORY: Complaining of shortness of breath and hypoxia. Denies any cough. Has chronic history of COPD.  CARDIOVASCULAR: No chest pain, palpitations.  GASTROINTESTINAL: Denies nausea, vomiting, diarrhea.  GENITOURINARY: No dysuria, hematuria.  GYNECOLOGIC AND BREASTS: Denies breast mass or vaginal discharge.  ENDOCRINE: Denies polyuria, nocturia.   HEMATOLOGIC AND LYMPHATIC: No anemia, easy bruising.  INTEGUMENTARY: No acne, rash, lesions.  MUSCULOSKELETAL: No joint pain in the neck and back. Denies any gout.  NEUROLOGIC: No history of ataxia or dementia.  PSYCHIATRIC: No ADD, OCD.   PHYSICAL EXAMINATION:  VITAL SIGNS Pulse 79, respirations 22,  pulse ox 93% on 3 liters.   GENERAL APPEARANCE: Not in any acute distress. Morbidly obese.   HEENT: Normocephalic, atraumatic. Pupils are equally reactive to light and accommodation. No scleral icterus. No conjunctival injection. No sinus tenderness. No postnasal drip.  NECK: Supple. Positive JVD. No thyromegaly. Range of motion is intact.  LUNGS: Positive rales and rhonchi in the bases up to the lower lung fields. No wheezing. Moderate air entry.   CARDIAC: S1, S2 normal. Regular rate and rhythm. No murmurs.  GASTROINTESTINAL: Soft, morbidly obese with pitting edema. Bowel sounds are positive in all 4 quadrants. Nontender, nondistended. No  hepatosplenomegaly.  NEUROLOGIC: Awake, alert, oriented x3. Motor and sensory grossly intact. Reflexes are 2+.  EXTREMITIES: Pitting edema 3+ is present. No cyanosis. No clubbing.  MUSCULOSKELETAL: No joint effusion, tenderness or erythema.  PSYCHIATRIC: Normal mood and affect.   LABS AND IMAGING STUDIES:  Glucose 143, BNP 10,174, BUN 10, creatinine 1.11, sodium 142, potassium 3.3, chloride 104, Anion gap 3. GFR greater than 60. Calcium 8.6. Troponin T less than 0.02. platelets 272. Urinalysis yellow in color, clear in appearance, ketones negative, specific gravity 1.018, leukocyte esterase negative, nitrite negative. Chest x-ray: Pulmonary edema is present, more on the left than on the right. A 12-lead EKG has revealed normal sinus rhythm with normal PR and QRS intervals. No acute ST-T wave changes were noted.    ASSESSMENT AND PLAN: A 69 year old morbidly obese female presenting to the Emergency Room with chief complaint of shortness of breath, pedal edema, significant weight gain of 40 pounds in the past few days with elevated BNP. Will be admitted with the following assessment and plan:  1. New onset congestive heart failure which is diastolic with recent ejection fraction 55% to 60% in July 2014. Admit her to telemetry. Will provide her aspirin, beta blocker, Lasix and statin. Will monitor daily weights. Strict ins and outs. BiPAP.  2. History of chronic obstructive pulmonary disease which is chronic in nature with chronic respiratory failure. Lives on 3 liters of oxygen. Will provide her DuoNebs. Not in acute exacerbation at this time.  3. Hypertension: Blood pressure medications are resumed.  4. Diabetes mellitus: The patient will be on sliding scale insulin.  5. Chronic renal insufficiency: At baseline. Monitor renal function closely as the patient is on Lasix.  6. Will provide her gastrointestinal and deep venous thrombosis prophylaxis.   She is FULL CODE.    Plan of care was discussed in detail with the patient. She is aware of the plan.   TOTAL TIME SPENT ON ADMISSION: 50 minutes.   ____________________________ Ramonita Lab, MD ag:gb D: 12/22/2012 02:34:02 ET T: 12/22/2012 04:46:38 ET JOB#: [id number]  cc: Ramonita Lab, MD, <Dictator> Ramonita Lab MD ELECTRONICALLY SIGNED 12/25/2012 6:42

## 2014-09-02 NOTE — Discharge Summary (Signed)
PATIENT NAME:  Rhonda HoughMORROW, Shareka MR#:  [id number] DATE OF BIRTH:  07/29/1945  Addendum to previously dictated discharge summary by Dr. Auburn BilberryShreyang Patel on 25th of July.   The patient stayed longer as she kept getting hypercarbic and hypoxic intermittently while in the hospital and required BiPAP intermittently. She would respond well to BiPAP with good mental status next day. She was followed by palliative care, and they agreed with the management.  Pulmonary also followed the patient and agreed with continuing BiPAP as needed. This morning, the patient was wide awake as per the nursing. When I saw the patient, the patient was falling asleep and gets hypersomnolent which seems to be her normal way of sleeping and awake cycle. She did have a similar spell the day before yesterday and responded well to BIPAP and was wide awake yesterday all day.  She is close to her baseline and is being discharged back to [facility] today in stable condition.  PHYSICAL EXAMINATION: VITAL SIGNS: On the date of discharge, her vital signs are as follows: Temperature 97.8, heart rate 80 per minute, respirations 20 per minute, blood pressure 12576 mmHg. She is saturating 96% on 2 liters oxygen via nasal cannula.   PERTINENT PHYSICAL EXAMINATION ON THE DATE OF DISCHARGE:  CARDIOVASCULAR: S1, S2 normal. No murmur, rubs or gallop.  LUNGS: Clear to auscultation bilaterally. No wheezing, rales, rhonchi or crepitation.  ABDOMEN: Soft, benign.  NEUROLOGIC: Nonfocal examination. All other physical examination remained at baseline.   DISCHARGE MEDICATIONS: 1.  Lasix 20 mg p.o. daily.  2.  Accupril  40 mg p.o. daily. 3.  Atenolol 100 mg p.o. daily to hold if systolic pressure less than 100.  4.  Prednisone 60 mg p.o. daily, taper 10 mg daily until finished.  5.  Tylenol 650 mg p.o. every 4 hours as needed.  6.  Sertraline 50 mg p.o. daily.  7.  Sliding scale insulin every 6 hours.  8. Pravastatin 20 mg p.o. at bedtime.  9.   DuoNeb inhaled every 4 hours as needed.  10.  Senna 1 tablet p.o. b.i.d. as needed.  11.  Budesonide 1 inhalation twice a day.  12.  Glucerna shake orally 3 times a day.   DISCHARGE DIET: Regular.   DISCHARGE ACTIVITY: As tolerated.   DISCHARGE INSTRUCTIONS AND FOLLOW-UP: The patient was instructed to follow up with her primary care physician at [facility]. She will need follow-up with [facility] in 2 to 3 weeks. She was instructed to use 2 liters oxygen via nasal cannula continuous and as needed. She was also instructed to use BiPAP as needed while at [facility] as this helps with her underlying sleep apnea and hypercarbia which makes hypersomnolent.   TOTAL TIME DISCHARGING THIS PATIENT: 55 minutes.    ____________________________ Ellamae SiaVipul S. Sherryll BurgerShah, MD vss:dp D: 12/09/2012 12:57:16 ET T: 12/09/2012 14:19:08 ET JOB#: [id number]  cc: [employer] S. Sherryll BurgerShah, MD, <Dictator>    Ellamae SiaVIPUL S [facility] MD ELECTRONICALLY SIGNED 12/10/2012 9:19

## 2014-09-02 NOTE — Consult Note (Signed)
Pt seen and examined. See Dawn Harrison's notes. Pt quite somnolent and difficult to arouse. Getting her 2nd unit of blood now. Poor oxygenation. Not stable for any endoscopic procedures. Will follow.   Electronic Signatures: Lutricia Feilh, Shatara Stanek (MD)  (Signed on 14-May-14 13:56)  Authored  Last Updated: 14-May-14 13:56 by Lutricia Feilh, Cielle Aguila (MD)

## 2014-09-02 NOTE — Consult Note (Signed)
Pt without complaints, CC is anemia, hgb up to 8.3, plt 320k, VSS afeb, on 3l nasal can. sat 93%.  Chest clear.  No new suggestions,  If she is discharge please arrange visit with Dr. Bluford Kaufmannh.  If she remains in hospital he wants to do an EGD on Monday.  Electronic Signatures: Scot JunElliott, Tayli Buch T (MD)  (Signed on 17-May-14 09:47)  Authored  Last Updated: 17-May-14 09:47 by Scot JunElliott, Aryah Doering T (MD)

## 2014-09-02 NOTE — Consult Note (Signed)
PATIENT NAME:  Rhonda Garner, SCHMUCK MR#:  [id number] DATE OF BIRTH:  1946/01/09  DATE OF CONSULTATION:  12/01/2012  REFERRING PHYSICIAN:  Felipa Furnace, MD CONSULTING PHYSICIAN:  Ardeen Fillers. Garnetta Buddy, MD  REASON FOR CONSULTATION: Agitation, questionable personality disorder.   HISTORY OF PRESENT ILLNESS: The patient is a 69 year old African American female who presented to the ER because of shortness of breath. She was initially evaluated by the ER physician and her ABGs showed pH of 7.41 with pCO2 of 87 and pO2 of 81. Her oxygen saturations are low. She was noted to be in significant distress and was about to be intubated, but they decided to try the BiPAP first. The patient apparently tolerated the BiPAP and she was comforted. Repeated ABG showed worsening of her condition with retention of carbon dioxide. The patient was continuously being monitored by the team and was transferred to the CCU.  They were monitoring her closely and deciding that if she continues to worsen then she will be intubated. As the patient showed acute respiratory failure, the patient was emergently intubated. The patient was in the critical care unit and her oxygen saturation dropped down to 57%. She was also showing systemic antiinflammatory response most likely secondary to pneumonitis. She did not have a fever, but was very tachypneic with elevation of the white cell count. She appeared to be very lethargic due to carbon dioxide retention. She was started on Levaquin at that time. The patient was monitored closely and was given steroids and beta agonist like Flovent. She has long history of smoking and was given oxygen. Once stabilized, the patient was extubated.   Psychiatric consult was placed as the patient was showing agitation 1 day status post extubation. She was placed on IV p.r.n. Haldol to which she was responding.   During my interview, the patient was lying in the CCU bed. She was very calm and comfortable. She  asked me "why am I here." She reported that she does not have any history of depression or anxiety. She reported that she is feeling tired and she was noted to be very lethargic. She was having difficulty answering the questions as she was short of breath. She reported that she is following all the directions as they were given to her. She reported that she is not eating well, but she is eating whatever is being provided to her at this time. She stated that she was a long time smoker and was previously smoking 3 packs of cigarettes per day. However, she has stopped smoking for the past 9 years. She currently lives by herself. She currently denied having any perceptual disturbances. She denied having any paranoia, suicidal ideations, depression or anxiety at this time.   PAST PSYCHIATRIC HISTORY: The patient reported that she does not have any previous psychiatric illness and has never seen a psychiatrist. She does not feel that she needs to be placed on any psychotropic medications.   PAST MEDICAL HISTORY:  1.  Obesity.  2.  Type 2 diabetes.  3.  Hypertension.  4.  COPD. 5.  Chronic respiratory failure requiring 2.5 liters of nasal cannula.  6.  Chronic kidney disease, stage III.  7.  Status post cholecystectomy.  8.  Recent extubation due to worsening of her COPD.  SOCIAL HISTORY: The patient currently lives by herself at her home. She has previous history of smoking, but she quit 9 years ago. She used to smoke more than a pack per day. She denies using any drugs  or alcohol at this time.   ALLERGIES: No known drug allergies.   FAMILY HISTORY: Positive for diabetes and hypertension in her parents.  HOME MEDICATIONS:  Include: 1.  Lasix 20 mg daily. 2.  Atenolol 100 mg once daily. 3.  Accupril 40 mg daily. 4.  Glucotrol.   VITALS:  Temperature 97.9, pulse 64, respirations 28, blood pressure 119/48.  LABORATORY DATA:  Blood glucose 126, BUN 38, creatinine 1.85, sodium 144, potassium 3.5,  chloride 105, bicarbonate 36, anion gap 28, osmolality 301, calcium 8.3. WBC 25.7, RBC 3.86, hemoglobin 7.7, hematocrit 25.4, platelet count 284, MCV 66, MCH 19.9, MCHC 30.2.   MENTAL STATUS EXAMINATION: The patient is a moderately obese female who was lying in the CCU bed. She appeared short of breath. It was difficult for her to complete a sentence. Her speech was low in tone and volume. Mood was fine. Affect was blunted. Thought process was logical, goal-directed. Thought content was nondelusional. She currently denied having any suicidal or homicidal ideations or plans. She denied having any perceptual disturbances. She demonstrated fair insight and judgment.   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  AXIS II: None.  AXIS III: Please review the medical history.   TREATMENT PLAN: The patient is currently getting Haldol 2 to 4 mg IV p.r.n. for her agitation which is due to her recent extubation.  I would not suggest any addition of psychotropic medication due to her recent condition and worsening of her respiratory illness. The patient does not appear to have any anxiety, depression or paranoia at this time. The patient will become more comfortable once her chronic obstructive pulmonary disease will improve. Please consult again if you need any further help. Thank you for allowing me to participate in the care of this patient.  ____________________________ Ardeen FillersUzma S. Garnetta BuddyFaheem, MD usf:sb D: 12/01/2012 15:36:05 ET T: 12/01/2012 16:11:09 ET JOB#: [id number]  cc: Ardeen FillersUzma S. Garnetta BuddyFaheem, MD, <Dictator> Rhunette CroftUZMA S Jewel Mcafee MD ELECTRONICALLY SIGNED 12/03/2012 15:21

## 2014-09-02 NOTE — Consult Note (Signed)
General Aspect 69 year old morbidly obese female with a past medical history of COPD, chronic diastolic CHF, chronic respiratory failure, on 3 liters of oxygen, hypertension, diabetes mellitus, presentign with SOB. Cardiology was consulted for diastolic CHF, SOB.  She reports running out of lasic for 2 weeks leading to her admission in July 2014. She was taking lasix 80 mg in the Am and 40 mg in the PM.  She was discharged from [facility] to [facility] on lasix 20 mg po daily per the patient. She has become more SOB at rehab over the past few weeks. She drinks significant water. [facility] was [facility]emic,  pulse ox was 85%  in the ER on arrivla.  she gained significant weight of 40 pounds.  Her bilateral lower extremities were swollen,  BNP  elevated. Recent echocardiogram done in July has revealed a left ventricular ejection fraction of 55% to 60%.    PAST MEDICAL HISTORY: Hypertension, COPD, chronic respiratory failure and lives on 2 liters of oxygen via nasal cannula, diabetes mellitus, chronic renal insufficiency stage III.   PAST SURGICAL HISTORY: Status post cholecystectomy.   ALLERGIES: No known drug allergies.    PSYCHOSOCIAL HISTORY: Lives at home, lives alone. Used to smoke but quit smoking. Denies alcohol or illicit drug use.    FAMILY HISTORY: Diabetes and hypertension in both parents.   Physical Exam:  GEN well developed, well nourished, no acute distress, obese   HEENT hearing intact to voice, moist oral mucosa   NECK supple   RESP normal resp effort  crackles   CARD Regular rate and rhythm  No murmur   ABD denies tenderness  soft   LYMPH negative neck   EXTR negative edema   SKIN normal to palpation   NEURO motor/sensory function intact   PSYCH alert, A+O to time, place, person   Review of Systems:  Subjective/Chief Complaint SOB, slowly improving. ABD swelling   General: Weakness   Skin: No Complaints   ENT: No Complaints   Eyes: No Complaints    Neck: No Complaints   Respiratory: Short of breath   Cardiovascular: Dyspnea   Gastrointestinal: No Complaints   Genitourinary: No Complaints   Vascular: No Complaints   Musculoskeletal: No Complaints   Neurologic: No Complaints   Hematologic: No Complaints   Endocrine: No Complaints   Psychiatric: No Complaints   Review of Systems: All other systems were reviewed and found to be negative   Medications/Allergies Reviewed Medications/Allergies reviewed     diabetes:    COPD:    hypertension:    Cholecystectomy:   Home Medications: Medication Instructions Status  Glucerna Shake * 237 milliliter(s) orally 3 times a day Active  senna 1 tab(s) orally 2 times a day, As needed, constipation Active  acetaminophen 325 mg oral tablet 2 tab(s) orally every 4 hours, As needed, pain or temp. greater than 100.4 Active  sertraline 50 mg oral tablet 1 tab(s) orally once a day Active  pravastatin 20 mg oral tablet 1 tab(s) orally once a day (at bedtime) Active  furosemide 20 mg oral tablet 1 tab(s) orally once a day Active  Accupril 40 mg oral tablet 1 tab(s) orally once a day Active  atenolol 100 mg oral tablet 1 tab(s) orally once a day Active   Lab Results: Routine Chem:  14-Aug-14 07:33   Glucose, Serum  162  BUN 13  Creatinine (comp) 1.16  Sodium, Serum 142  Potassium, Serum 4.0  Chloride, Serum 98  CO2, Serum  42  Calcium (Total), Serum 8.9  Anion Gap  2  Osmolality (calc) 287  eGFR (African American)  57  eGFR (Non-African American)  49 (eGFR values <82m/min/1.73 m2 may be an indication of chronic kidney disease (CKD). Calculated eGFR is useful in patients with stable renal function. The eGFR calculation will not be reliable in acutely ill patients when serum creatinine is changing rapidly. It is not useful in  patients on dialysis. The eGFR calculation may not be applicable to patients at the low and high extremes of body sizes, pregnant women, and  vegetarians.)  Result Comment CO2 - RESULTS VERIFIED BY REPEAT TESTING.  - NOTIFIED OF CRITICAL VALUE  - C/ TO DOLL FERGUSON @0807  12-24-12 BY [facility]  - READ-BACK PROCESS PERFORMED.  Result(s) reported on 24 Dec 2012 at 08:04AM.   EKG:  Interpretation EKG shows NSR rates in teh 70s, no significant ST or T wave changes   Radiology Results: XRay:    11-Aug-14 23:50, Chest PA and Lateral  Chest PA and Lateral   REASON FOR EXAM:    sob; hypoxia  COMMENTS:       PROCEDURE: DXR - DXR CHEST PA (OR AP) AND LATERAL  - Dec 21 2012 11:50PM     RESULT: Comparison: 12/09/2012    Findings:     PA and lateral chest radiographs are provided. There is bilateral   interstitial thickening with more focal right lower lobe airspace   disease. There bilateral small pleural effusions. There is no   pneumothorax. The heart size is enlarged.  The osseous structures are   unremarkable.  IMPRESSION:     Bilateral interstitial thickening, bilateral small pleural effusions and   right lower lobe airspace disease. Differential diagnoses includes   pulmonary edema versus pneumonitis more focally at the right lung base.    Dictation Site: 1        Verified By: HJennette Banker M.D., MD    No Known Allergies:   Vital Signs/Nurse's Notes: **Vital Signs.:   14-Aug-14 07:30  Vital Signs Type Routine  Temperature Temperature (F) 98.3  Celsius 36.8  Temperature Source oral  Pulse Pulse 81  Respirations Respirations 18  Systolic BP Systolic BP 1433 Diastolic BP (mmHg) Diastolic BP (mmHg) 76  Mean BP 104  Pulse Ox % Pulse Ox % 95  Pulse Ox Activity Level  At rest  Oxygen Delivery 6L    Impression 69year old morbidly obese female with a past medical history of COPD, chronic diastolic CHF, chronic respiratory failure, on 3 liters of oxygen, hypertension, diabetes mellitus, presentign with SOB. Cardiology was consulted for diastolic CHF, SOB.  1) Acute on chronic diastolic CHF Was managed in teh past  on lasix 80 mg in AM and 40 mg in the PM Two admissions since she ran out of lasix (1st admission), or last d/c on lasix 20 mg daily --Would continue lasix IV BID given continue crackles in lungs suggestive of CHF --Can push lasix until creatinine starts to climb, then change back to lasix 80 mg in the Am and 40 mg in the PM --After d/c she can follow up in clinic to avoid readmissions --Continue other meds.  2) HTN Continue current meds. BP may continue to improve with additional diuresis  3) COPD: Seems stable, though likely contributing to diastolic CHF  4) Diabetes:  per medical service   Electronic Signatures: GIda Rogue(MD)  (Signed 14-Aug-14 09:08)  Authored: General Aspect/Present Illness, History and Physical Exam, Review of System, Past Medical History,  Home Medications, Labs, EKG , Radiology, Allergies, Vital Signs/Nurse's Notes, Impression/Plan   Last Updated: 14-Aug-14 09:08 by Ida Rogue (MD)

## 2014-09-02 NOTE — Consult Note (Signed)
Brief Consult Note: Diagnosis: Acute on chronic respiratory failure.  Anemia suspected related to chronic blood loss.  Admitted [facility] 06/[facility] for very similar presentation.  Endoscopy evaluation was not performed at that time due to high risk status. Heme positive stool. Known history of CHF with evidence of CXR.  Pneumonia. Diabetes mellitus.  Hypertension.   Consult note dictated.   Discussed with Attending MD.   Comments: Patient's presentation discussed with Dr. Lutricia FeilPaul Oh.  Reviewed patient's current presentation as well as [facility] with Dr. Bluford Kaufmannh and given current respiratory status do not feel that patient is a candidate for endoscopy evaluation at this time.  High risk status.  Will continue to monitor.  In agreement with blood transfusion as necessary along with serial monitoring of hemoglobin and hematocrit.  Continue with Protonix gtt as well as antibiotic therapy.  Electronic Signatures: Rodman KeyHarrison, Joseh Sjogren S (NP)  (Signed 14-May-14 12:38)  Authored: Brief Consult Note   Last Updated: 14-May-14 12:38 by Rodman KeyHarrison, Keonta Alsip S (NP)

## 2014-09-02 NOTE — Consult Note (Signed)
PATIENT NAME:  Rhonda Garner, Rhonda Garner MR#:  [id number] DATE OF BIRTH:  03/21/1946  DATE OF ADMISSION:  09/23/2012  DATE OF CONSULTATION:  09/23/2012  ATTENDING PHYSICIAN:  Dr. Waldron Labs.  CONSULTING PHYSICIAN:  Payton Emerald, NP and Dr. Verdie Shire.  PRIMARY CARE PHYSICIAN:  He is followed at [facility].  REASON FOR CONSULTATION: Anemia, hemoglobin of 5.7.    HISTORY OF PRESENT ILLNESS:  Rhonda Garner is a 69 year old African American female who presented to [facility] Emergency Room for the first time of being seen at this facility with an extensive medical history of diabetes mellitus, COPD, congestive heart failure with an EF of 45% to 50%, chronic kidney disease. History is obtained from H and P on admission, as well as reviewing old medical records from [facility]. The patient is extremely groggy due to hypoxic state and is unable to stay awake enough to maintain a conversation. Also, history obtained from nursing staff. She is O2 dependent at home on 2.5 liters.  She presented with a complaint of hyperglycemia. States that when she was at home, she had checked her blood sugar, and her blood sugar was in the 400s. Then when she came to the Emergency Room, she was found to have a fingerstick of 211. Presented to be confused, lethargic and significant for wheezing. ABG was done, which did show hypercarbic, hypoxic respiratory failure with a pCO2 of 92 and O2 of 59. The patient was started on BiPAP at that time. The patient has remained on BiPAP. She had a lapse in having a sitter with her for 10 minutes, and apparently she pulled out 3 of her IVs. Her O2 sat has noted to drop over the last several hours. Most recent O2 sat is 94%, according to respiratory therapist at the time of interviewing process.   The patient found to have Hemoccult positive stool in the Emergency Room, though there is documentation that there was no significant evidence of melena or bright red blood per rectum. No complaint of  coffee-ground emesis or hematemesis. In reviewing [facility]'s medical records, she was hospitalized on 06/23/2012 with a two-day history of complaint of melena with a 15 pound unintentional weight loss over the past few months prior to that. She amplified respiratory distress at that time. She was seen by Dr. Olene Floss, as well as Dr. Renford Dills, who did not recommend endoscopic evaluation due to her respiratory status at that point. Her hemoglobin was 7 on admission, and she did receive 2 units of packed red blood cells at that time.  On social history, do document a history up until 2008 of smoking 3 packs of cigarettes a day.    She was hypotensive when she arrived to the Emergency Room. She denied any NSAID use. She had been complaining of a cough, nonproductive, nonbloody. No fevers, chills, sweats, chest pain, heart palpitations. Mild complaint of a headache.  No documented history of colonoscopy or upper endoscopy performed in the past during her lifetime.   PAST MEDICAL HISTORY:  Diabetes, hypertension, COPD, O2 dependent; chronic respiratory failure on 2.5 liters nasal cannula at home, congestive heart failure with an EF of 45% to  50%.   PAST SURGICAL HISTORY: Cholecystectomy.   FAMILY HISTORY: Documented diabetes, hypertension.   SOCIAL HISTORY: Resides by herself with her dog.  History of excessive tobacco use up until 2008.  No alcohol use.  ALLERGIES: None.   HOME MEDICATIONS:  Aspirin 1 a day, but milligrams are note documented. Atenolol noted  to take every day, but milligrams, as well as frequency not documented. Glipizide is 10 mg once a day and Lasix milligrams and frequency or not documented.   REVIEW OF SYSTEMS:  Reviewed review of systems on her H and P on admission.    CONSTITUTIONAL: Denies any fevers, chills, fatigue. Significant for weakness. Significant for weight loss, according to [facility] records.  EYES: No blurred vision, double vision, pain, glaucoma.  HENT:  No hearing loss or epistaxis.  RESPIRATORY: Significant for cough. See HPI. Known history of COPD, O2 dependent.  CARDIOVASCULAR: No chest pain, heart palpitations.  GASTROINTESTINAL: See HPI. GENITOURINARY:  Denies dysuria or hematuria.  ENDOCRINE: No polyuria or polydipsia.  HEMATOLOGIC: Denies bruising and bleeding.  NEUROLOGIC: No history of CVA or TIA or seizures. Complaint of headaches.  PSYCHIATRIC:  No depression. No anxiety. Other than what is stated, review of systems is not able to be further obtained given the patient's mental status and respiratory status at this time.   PHYSICAL EXAMINATION: VITAL SIGNS: Temperature is 98.3 with a pulse of 84, respirations 22, blood pressure 141/75, pulse ox is 94% on BiPAP.  HEENT: Normocephalic, atraumatic. Pupils equal and reactive.  NECK: Supple. Trachea midline. No lymphadenopathy. No JVD.  CARDIOVASCULAR: Regular rhythm, S1, S2. No murmurs, no gallops.  PULMONARY: Symmetric rise and fall of chest. Decreased breath sounds, probably related to poor inspiratory and expiratory effort, given her current respiratory status.  ABDOMEN: Soft, nondistended. Hypoactive bowel sounds in 4 quadrants. No bruits. No masses.  RECTAL:  Deferred. Noted heme-positive stool at the time of exam in the ER.  MUSCULOSKELETAL: No contractures. Gait not assessed.  PSYCHIATRIC:  Very minimal response to verbal stimuli. Able to open her eyes, but not able to maintain conversation, thus unable to assess alertness and orientation.  NEUROLOGICAL: No gross neurological deficits.  SKIN: Warm and dry. No lesions. No rashes.   LABORATORY AND DIAGNOSTIC: On 09/23/2012, creatinine level is 1.49, potassium is 3.3, chloride is 94, CO2 was 44. EGFR is 43. Calcium low at 8.1. Hepatic panel within normal limits except albumin 2.7. CBC revealed hemoglobin of 5.7 with hematocrit of 20.3, MCV is 65, MCH is 18.2, RDW is 20.2, MCHC is 2.81, WBC count is 15.1 with a platelet count of  393,000. The patient is completing second unit of packed red blood cells at this time and CBC ordered for 2:30 this evening, as well as every 8 hour serial hemoglobin and hematocrit. Chest x-ray revealed congestive heart failure, as well as right pulmonary infiltrate, diagnosed with pneumonia. ABGs as stated under history.     IMPRESSION:  Anemia, suspected secondary to chronic blood loss, given review of records and information from [place]. At that time, found to have a hemoglobin of 7. Heme-positive stool. Respiratory failure. Chest x-ray revealing evidence of congestive heart failure, as well as right pulmonary infiltrate, diagnosed with pneumonia. Known history of diabetes, hypertension, chronic obstructive pulmonary disease, oxygen dependent.   PLAN: The patient's presentation discussed with Dr. Verdie Shire. Given the patient's current presentation of acute on chronic respiratory failure, the patient is not an ideal candidate to proceed forward with endoscopic evaluation. Respiratory status will need to improve. Agree with transfusion as necessary. We will await serial monitoring of hemoglobin and hematocrit over the next several hours. The patient is receiving Protonix drip and agreement for this to be continued. Also continuation of antibiotic therapy. Azithromycin as well as Rocephin is ordered for management of pneumonia. Given her history of  congestive heart failure, as well findings, the patient is receiving Lasix 20 mg IV every 12 hours. We will continue to monitor the patient's status during her hospitalization.   These services provided by Ebony Cargo, MS, APRN, [facility], FNP under collaborative agreement with Verdie Shire, M.D.    ____________________________ Payton Emerald, NP dsh:dmm D: 09/23/2012 12:35:10 ET T: 09/23/2012 13:16:04 ET JOB#: [id number]  cc: Payton Emerald, NP, <Dictator> Payton Emerald MD ELECTRONICALLY SIGNED 09/23/2012 17:22

## 2014-09-02 NOTE — H&P (Signed)
PATIENT NAME:  Rhonda HoughMORROW, Apple MR#:  [id number] DATE OF BIRTH:  14-Jan-1946  DATE OF ADMISSION:  11/28/2012  ADDENDUM - FOLLOWING NOTE  I have been monitoring the patient closely through the afternoon. ABG is repeated. Previous ABGs have a pH of 7.41 on admission, down to 7.33, third one 7.34, last one at 1400 was 7.27. Her pCO2 was 87 at 8:00 a.m., 99 at 9:50 and 105 at 1400. Her pO2 was 70 at 10:45 on 50% FiO2 on BiPAP, was increased to 75% and her pO2 was 121.   The patient has worsening of her mental status. On evaluation, the patient is very lethargic, difficult to arouse. After she arouses, she is able to talk, but she becomes altered and confused and also becomes very combative. This is likely due to CO2 retention, for what we decided to proceed with intubation. The patient complains of shortness of breath and once she is taken off the BiPAP, her oxygen saturations drop to 50%. The patient is fighting and is not keeping up on her BiPAP.   PHYSICAL EXAMINATION:   VITAL SIGNS: Her temperature was 97.7, blood pressure 142/78, respirations 25, pulse 68, pulse oximetry 100%.  LUNGS: Sounded more diminished with decreased air entrance and use of accessory muscles whenever patient tries to talk or move.  CARDIOVASCULAR: Regular rate and rhythm.  ABDOMEN: Soft.  EXTREMITIES: No edema, cyanosis or clubbing.   ASSESSMENT AND PLAN: Acute respiratory failure, worsening with increased hypercarbia, still hypoxic whenever taken off the BiPAP. Her FiO2 was 75% (that was increased from 50%) and her oxygen saturations were just above 100. The patient is severely ill, critically ill, and now she is becoming very lethargic and going back and forth from lethargy and agitation, for what she is not keeping up with her BiPAP. We are going to proceed to sedate her and intubate her.   TIME SPENT: Critical care time spent with the patient: 30 minutes on top of 60 minutes earlier, 90 minutes total, without accounting for  procedure.   See procedure for intubation on separate note.   The patient refused to call family at admission. She said that she does not want anybody to be notified and she refused to give contact information. This is documented in the computer.   The patient accepted earlier to be intubated if worsening, whenever she was alert and oriented x 3. Now that she is not, she is fighting, but she is confused and not able to take decisions or make any decisions.   We are going to do propofol for pretreatment of intubation. We are going to do fentanyl 100 mg, Versed 4 mg and etomidate 20 mg.  Again, 30 minutes spent without accounting for procedure, total of 90 minutes critical care time today.    ____________________________ Felipa Furnaceoberto Sanchez Gutierrez, MD rsg:jm D: 11/28/2012 15:12:52 ET T: 11/28/2012 16:27:13 ET JOB#: [id number]  cc: Felipa Furnaceoberto Sanchez Gutierrez, MD, <Dictator> Darnell Jeschke Juanda ChanceSANCHEZ GUTIERRE MD ELECTRONICALLY SIGNED 12/06/2012 0:38

## 2014-09-02 NOTE — Discharge Summary (Signed)
PATIENT NAME:  Rhonda Garner, Rhonda Garner MR#:  [id number] DATE OF BIRTH:  01-06-1946  DATE OF ADMISSION:  11/28/2012 DATE OF DISCHARGE:  12/04/2012  ADMITTING DIAGNOSIS:  Acute respiratory failure.   DISCHARGE DIAGNOSES: 1.  Acute on chronic respiratory failure due to chronic obstructive pulmonary disease exacerbation as well as community-acquired pneumonia.  2.  Hypercarbic and hypoxic mixed respiratory failure, likely due to above reasons.  3.  Likely obstructive sleep apnea. Needs outpatient sleep study.  4.  Anemia with iron deficiency, status post transfusion.  5.  Acute on chronic obstructive pulmonary disease exacerbation.  6.  Diabetes, poorly controlled.  7.  Hypertension.  8.  Hypokalemia.  9.  Hyperlipidemia.  10.  Acute on chronic renal failure. Renal function is close to baseline now.  11.  Depression and anxiety. The patient stated that she is not going to take any antianxiety medications. Status post evaluation by psychiatry.  12.  Leukocytosis felt to be due to steroids.  LABORATORY AND DIAGNOSTICS:  Glucose 276, BUN 26, creatinine 1.48, sodium 137, potassium 3.2, chloride 90, CO2 42, calcium 9.3, total protein 6.8, albumin 2.9, bili total 0.3, alk phos 113, AST 18 and ALT 12. Troponin less than 0.02. WBC 12.5, hemoglobin 7.2 and platelet count 338.   ABG showed a pH of 7.41, pCO2 87 and pO2 81.   Echocardiogram of the heart showed EF 55% to 60%, mildly dilated left atrium.   Most recent BUN is 33 and creatinine 1.26, on 07/25.  Her WBC count is 19.7, hemoglobin 9.1 and platelet count 234.   Chest x-ray on admission showed bilateral diffuse interstitial thickening likely representing interstitial edema versus interstitial pneumonitis.   CONSULTANTS: Dr. Mortimer Fries, palliative care, psychiatry.   HOSPITAL COURSE: Please refer to H and P done by the admitting physician. The patient is a 69 year old African American female with history of COPD, likely sleep apnea, who presented with  worsening shortness of breath and was noted to have hypercarbic respiratory failure. Initially she was kept on BiPAP and then her pCO2 continued to be elevated so she was intubated. The patient was also thought to have pneumonia, which was treated with antibiotics, and she was placed on IV steroids. She was subsequently extubated. The patient was thought to have likely severe COPD, based on her presentation, and she also was started on antibiotics, which were subsequently discontinued by pulmonary. The patient also was refusing her breathing treatments and certain medications. Psychiatry consult was obtained. They felt that she did not have any anxiety or depression or paranoia at the time of the visit. The patient also was seen by palliative care and she was made DNR based on her respiratory chronic failure. At this time, she is stable and is waiting to go to the skilled nursing facility. The patient is referred to rehab. She needs PT and OT.   DISCHARGE MEDICATIONS:  1.  Lasix 20 daily. 2.  Accupril 40 daily. 3.  Atenolol 100 daily. 4.  Prednisone taper 60 mg taper x 10 until complete. 5.  Tylenol 650 q. 4 p.r.n. for pain or temperature. 6.  Sertraline 50 daily. 7.  Insulin sliding scale. 8.  Pravastatin 20 at bedtime.  9.  Albuterol and Atrovent nebs q. 4 as needed. 10.  Senna 1 tab p.o. b.i.d.  11.  Budesonide 1 inhalation b.i.d. 12.  Glucerna shakes 1 shake t.i.d.   HOME OXYGEN:  3 liters nasal cannula.   DIET: Regular.   ACTIVITY: As tolerated with PT evaluation and treat.  DISCHARGE FOLLOWUP: With primary MD in 1 to 2 weeks.  TIME SPENT ON DISCHARGE:  45 minutes.   ____________________________  H. , MD shp:sb D: 12/04/2012 14:47:32 ET T: 12/04/2012 15:15:16 ET JOB#: [id number]  cc:  H. , MD, <Dictator>  H  MD ELECTRONICALLY SIGNED 12/14/2012 9:02 

## 2014-09-03 NOTE — H&P (Signed)
PATIENT NAME:  Rhonda Garner, Rhonda Garner MR#:  [id number] DATE OF BIRTH:  August 23, 1945  DATE OF ADMISSION:  10/10/2013  ADMITTING PHYSICIAN: Enid Baasadhika Ociel Retherford, MD    PRIMARY CARE PHYSICIAN: Dr. Dario GuardianJadali.    CHIEF COMPLAINT: Difficulty breathing and right-sided chest pain.   HISTORY OF PRESENT ILLNESS: This patient is a 69 year old obese African American female with past medical history significant for hypertension, non-insulin-dependent diabetes mellitus, chronic respiratory failure secondary to COPD on 3 liters home oxygen, CKD with baseline creatinine of 1.3, comes to the hospital secondary to 2-day history of worsening right-sided chest pain, cough, and difficulty breathing. The patient is a poor historian. She says she was doing fine up until 2 days ago when she started noticing that she was very weak, having more chills at home, felt sick with the nausea, worsening cough, and could not get warm. Yesterday, she felt she was having severe right-sided chest pain that was getting worse with each heartbeat according to the patient. Symptoms were not improving so she came to the hospital.  Actually a friend of hers drove her to the hospital. She is on 3 liters home oxygen, saturations were around 90% on 3 liters, but that had to be increased up to 4 liters as she became a little hypoxic. She was requiring IV pain medications for her pleuritic chest pain and labs show elevated WBC and chest x-ray showing right middle lobe pneumonia. Also, the patient has CKD but with sepsis.  It seems like she is also in acute renal failure with creatinine at 2.8. So, she is being admitted for sepsis and pneumonia.   PAST MEDICAL HISTORY:  1. Hypertension.  2. Chronic respiratory failure, on 3 liters home oxygen.  3. Chronic obstructive pulmonary disease.  4. Non-insulin-dependent diabetes mellitus.  5. Diastolic congestive heart failure.  6. Chronic kidney disease stage III with baseline creatinine around 1.3.   PAST SURGICAL  HISTORY:  Cholecystectomy.   ALLERGIES TO MEDICATIONS: No known drug allergies.   CURRENT HOME MEDICATIONS:  Could not verify patient's home medication list, but as far as she could remember, she is on the following medications:  1. Lasix 60 mg p.o. daily.  2. Tylenol 650 mg q. 4 hours p.r.n. for pain.  3. Norvasc 5 mg p.o. daily.  4. Aspirin 81 mg p.o. daily.  5. Combivent Respimat 1 puff 4 times a day.  6. Metoprolol 50 mg p.o. b.i.d.  7. Pravastatin 20 mg p.o. at bedtime.  8. Quinapril 40 mg p.o. b.i.d.  9. Glipizide 5 mg p.o. q.a.m.   SOCIAL HISTORY: Lives at home by herself, very ambulatory, and functional at baseline according to patient. Quit smoking more than 10 years ago. No alcohol or drug abuse.   FAMILY HISTORY: Diabetes and hypertension, both parents.   REVIEW OF SYSTEMS:  CONSTITUTIONAL: Positive for fever and chills and fatigue and weakness.  EYES: No blurred vision, double vision, inflammation or glaucoma or cataracts. Uses reading glasses.  EARS, NOSE, AND THROAT:  No tinnitus, ear pain, hearing loss, epistaxis, or discharge.  RESPIRATORY: Positive for cough. No wheezing. No hemoptysis. Positive for COPD.  CARDIOVASCULAR: Positive for right-sided chest pain.  No orthopnea.  Positive dyspnea on exertion. No palpitations or syncope.  GASTROINTESTINAL: No nausea, vomiting, diarrhea, abdominal pain, hematemesis, or melena.  GENITOURINARY: No dysuria, hematuria, renal calculus, frequency, or incontinence.  ENDOCRINE: No polyuria, nocturia, thyroid problems, heat or cold intolerance.  HEMATOLOGY: No anemia, easy bruising or bleeding.  SKIN: No acne, rash or lesions.  MUSCULOSKELETAL:  No neck, back, shoulder pain, arthritis or gout.  NEUROLOGIC: No numbness, weakness, CVA, TIA, or seizures.  PSYCHOLOGICAL: No anxiety, insomnia, or depression.   PHYSICAL EXAMINATION:  VITAL SIGNS: Temperature 98.2 degrees Fahrenheit, pulse of 103, respirations 18, blood pressure 116/56,  pulse oximetry 92% on 3 liters oxygen.  GENERAL: Heavily-built, well-nourished female lying in bed in mild distress secondary to right-sided chest pain.  HEENT: Normocephalic, atraumatic. Pupils equal, round, reacting to light. Anicteric sclerae. Extraocular movements intact. Oropharynx clear without erythema, mass or exudates.  NECK: Supple. No thyromegaly, JVD or carotid bruits. No lymphadenopathy.  LUNGS: She is moving air bilaterally. Bilateral rhonchi are heard. No wheezing and also no crackles heard. No use of accessory muscles for breathing.  CARDIOVASCULAR: S1, S2, regular rate and rhythm.  A 3/6 systolic murmur heard. No rubs or gallops.  ABDOMEN: Obese, soft, nontender, nondistended. No hepatosplenomegaly. Normal bowel sounds.  EXTREMITIES: No pedal edema. No clubbing or cyanosis, 2+ dorsalis pedis pulse is palpable bilaterally.  SKIN: No acne, rash or lesions.  LYMPHATICS: No cervical or inguinal lymphadenopathy.  NEUROLOGICAL: Cranial nerves grossly intact. No focal, motor, or sensory deficits.  PSYCHOLOGICAL: The patient is awake, alert, oriented x 3.   LABORATORY DATA: WBC 24.1, hemoglobin 9.3, hematocrit 30.7, platelet count 230,000.  Sodium 133, potassium 4.6, chloride 97, bicarbonate 30, BUN 58, creatinine 2.87, glucose 105, and calcium of 8.9.  CK 80, CK-MB 1. Troponin less than 0.02.    RADIOLOGICAL DATA:  Chest x-ray showing right middle lobe infiltrate, enlargement of cardiac silhouette.  It could be chronic atelectasis or scarring. Acute superimposed pneumonia cannot be ruled out. No pleural effusions, pneumothorax, or pulmonary edema noted. Chest: EKG showing normal sinus rhythm, heart rate of 94.   ASSESSMENT AND PLAN: A 69 year old female with hypertension, diabetes, chronic respiratory failure secondary to chronic obstructive pulmonary disease, on home oxygen, congestive heart failure, chronic kidney disease comes with pneumonia and acute renal failure.  1. Early sepsis  secondary to pneumonia. Cultures, antibiotics, and oxygen support.  2. Right-sided pleuritic chest pain versus musculoskeletal chest pain. Right-sided pneumonia based on chest x-ray. The patient has chronic scarring in the right middle lung. Superimposed pneumonia cannot be ruled out and with her significant pleuritic pain, blood cultures have been ordered and she has started on antibiotics. Continue to monitor. She is on 3 liters at home now, increased up to 4 liters due to hypoxia. Continue to monitor.  3. Acute renal failure on chronic kidney disease stage III. Baseline creatinine around 1.3. The patient takes Lasix and quinapril at home. She has history of diastolic congestive heart failure, but now appears slightly dehydrated. No edema seen. So, we will give gentle hydration, monitor creatinine, hold those medications, get a renal ultrasound, and see how she does.  4. Chronic respiratory failure secondary to chronic obstructive pulmonary disease. No active wheezing. Continue inhalers and nebulizers p.r.n. and monitor.  5. Hypertension. Continue home medications.  6. Diabetes mellitus. Continue her glipizide and place her on sliding scale insulin while in the hospital.  7. Deep venous thrombosis prophylaxis with subcutaneous heparin.   CODE STATUS: Full code.   TIME SPENT ON ADMISSION: 50 minutes.    ____________________________ Enid Baas, MD rk:dd D: 10/10/2013 18:56:00 ET T: 10/10/2013 19:28:11 ET JOB#: [id number]  cc: Marlyn Corporal, MD Enid Baas, MD, <Dictator>   Enid Baas MD ELECTRONICALLY SIGNED 10/23/2013 14:20
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
## 2014-12-14 ENCOUNTER — Other Ambulatory Visit: Payer: Self-pay | Admitting: Family Medicine

## 2014-12-27 ENCOUNTER — Inpatient Hospital Stay
Admission: EM | Admit: 2014-12-27 | Discharge: 2015-01-03 | DRG: 189 | Disposition: A | Discharge status: Home / Self Care | Payer: Medicare Other | Attending: Internal Medicine | Admitting: Internal Medicine

## 2014-12-27 ENCOUNTER — Other Ambulatory Visit: Payer: Self-pay

## 2014-12-27 ENCOUNTER — Emergency Department: Payer: Medicare Other

## 2014-12-27 DIAGNOSIS — R5383 Other fatigue: Secondary | ICD-10-CM | POA: Diagnosis not present

## 2014-12-27 DIAGNOSIS — E1122 Type 2 diabetes mellitus with diabetic chronic kidney disease: Secondary | ICD-10-CM | POA: Diagnosis present

## 2014-12-27 DIAGNOSIS — I5032 Chronic diastolic (congestive) heart failure: Secondary | ICD-10-CM | POA: Diagnosis present

## 2014-12-27 DIAGNOSIS — E119 Type 2 diabetes mellitus without complications: Secondary | ICD-10-CM | POA: Diagnosis not present

## 2014-12-27 DIAGNOSIS — T380X5A Adverse effect of glucocorticoids and synthetic analogues, initial encounter: Secondary | ICD-10-CM | POA: Diagnosis present

## 2014-12-27 DIAGNOSIS — N179 Acute kidney failure, unspecified: Secondary | ICD-10-CM | POA: Diagnosis present

## 2014-12-27 DIAGNOSIS — J189 Pneumonia, unspecified organism: Secondary | ICD-10-CM | POA: Diagnosis not present

## 2014-12-27 DIAGNOSIS — R531 Weakness: Secondary | ICD-10-CM | POA: Diagnosis not present

## 2014-12-27 DIAGNOSIS — J449 Chronic obstructive pulmonary disease, unspecified: Secondary | ICD-10-CM | POA: Diagnosis not present

## 2014-12-27 DIAGNOSIS — J9621 Acute and chronic respiratory failure with hypoxia: Secondary | ICD-10-CM

## 2014-12-27 DIAGNOSIS — I517 Cardiomegaly: Secondary | ICD-10-CM | POA: Diagnosis not present

## 2014-12-27 DIAGNOSIS — Z87891 Personal history of nicotine dependence: Secondary | ICD-10-CM

## 2014-12-27 DIAGNOSIS — Z79899 Other long term (current) drug therapy: Secondary | ICD-10-CM | POA: Diagnosis not present

## 2014-12-27 DIAGNOSIS — I248 Other forms of acute ischemic heart disease: Secondary | ICD-10-CM | POA: Diagnosis present

## 2014-12-27 DIAGNOSIS — Z9049 Acquired absence of other specified parts of digestive tract: Secondary | ICD-10-CM | POA: Diagnosis present

## 2014-12-27 DIAGNOSIS — I130 Hypertensive heart and chronic kidney disease with heart failure and stage 1 through stage 4 chronic kidney disease, or unspecified chronic kidney disease: Secondary | ICD-10-CM | POA: Diagnosis present

## 2014-12-27 DIAGNOSIS — Z9981 Dependence on supplemental oxygen: Secondary | ICD-10-CM | POA: Diagnosis not present

## 2014-12-27 DIAGNOSIS — Z8249 Family history of ischemic heart disease and other diseases of the circulatory system: Secondary | ICD-10-CM

## 2014-12-27 DIAGNOSIS — Z888 Allergy status to other drugs, medicaments and biological substances status: Secondary | ICD-10-CM

## 2014-12-27 DIAGNOSIS — E785 Hyperlipidemia, unspecified: Secondary | ICD-10-CM | POA: Diagnosis present

## 2014-12-27 DIAGNOSIS — F4322 Adjustment disorder with anxiety: Secondary | ICD-10-CM | POA: Insufficient documentation

## 2014-12-27 DIAGNOSIS — N189 Chronic kidney disease, unspecified: Secondary | ICD-10-CM

## 2014-12-27 DIAGNOSIS — R000 Tachycardia, unspecified: Secondary | ICD-10-CM | POA: Diagnosis not present

## 2014-12-27 DIAGNOSIS — E874 Mixed disorder of acid-base balance: Secondary | ICD-10-CM | POA: Diagnosis present

## 2014-12-27 DIAGNOSIS — E876 Hypokalemia: Secondary | ICD-10-CM | POA: Diagnosis present

## 2014-12-27 DIAGNOSIS — D631 Anemia in chronic kidney disease: Secondary | ICD-10-CM | POA: Diagnosis present

## 2014-12-27 DIAGNOSIS — J9620 Acute and chronic respiratory failure, unspecified whether with hypoxia or hypercapnia: Secondary | ICD-10-CM | POA: Diagnosis present

## 2014-12-27 DIAGNOSIS — E870 Hyperosmolality and hypernatremia: Secondary | ICD-10-CM | POA: Diagnosis present

## 2014-12-27 DIAGNOSIS — Z7982 Long term (current) use of aspirin: Secondary | ICD-10-CM

## 2014-12-27 DIAGNOSIS — J9622 Acute and chronic respiratory failure with hypercapnia: Secondary | ICD-10-CM | POA: Diagnosis present

## 2014-12-27 DIAGNOSIS — R599 Enlarged lymph nodes, unspecified: Secondary | ICD-10-CM | POA: Diagnosis not present

## 2014-12-27 DIAGNOSIS — E1165 Type 2 diabetes mellitus with hyperglycemia: Secondary | ICD-10-CM | POA: Diagnosis present

## 2014-12-27 DIAGNOSIS — Z6837 Body mass index (BMI) 37.0-37.9, adult: Secondary | ICD-10-CM | POA: Diagnosis not present

## 2014-12-27 DIAGNOSIS — R609 Edema, unspecified: Secondary | ICD-10-CM | POA: Diagnosis not present

## 2014-12-27 DIAGNOSIS — R0602 Shortness of breath: Secondary | ICD-10-CM

## 2014-12-27 DIAGNOSIS — N183 Chronic kidney disease, stage 3 (moderate): Secondary | ICD-10-CM | POA: Diagnosis present

## 2014-12-27 DIAGNOSIS — R918 Other nonspecific abnormal finding of lung field: Secondary | ICD-10-CM | POA: Diagnosis present

## 2014-12-27 DIAGNOSIS — D509 Iron deficiency anemia, unspecified: Secondary | ICD-10-CM | POA: Diagnosis not present

## 2014-12-27 DIAGNOSIS — J441 Chronic obstructive pulmonary disease with (acute) exacerbation: Secondary | ICD-10-CM | POA: Diagnosis present

## 2014-12-27 DIAGNOSIS — I129 Hypertensive chronic kidney disease with stage 1 through stage 4 chronic kidney disease, or unspecified chronic kidney disease: Secondary | ICD-10-CM | POA: Diagnosis not present

## 2014-12-27 LAB — PROTIME-INR
INR: 1.01
PROTHROMBIN TIME: 13.5 s (ref 11.4–15.0)

## 2014-12-27 LAB — URINALYSIS COMPLETE WITH MICROSCOPIC (ARMC ONLY)
Bilirubin Urine: NEGATIVE
GLUCOSE, UA: NEGATIVE mg/dL
Hgb urine dipstick: NEGATIVE
LEUKOCYTES UA: NEGATIVE
Nitrite: NEGATIVE
Protein, ur: >500 mg/dL — AB
SPECIFIC GRAVITY, URINE: 1.015 (ref 1.005–1.030)
pH: 5 (ref 5.0–8.0)

## 2014-12-27 LAB — CBC
HCT: 33.5 % — ABNORMAL LOW (ref 35.0–47.0)
HEMOGLOBIN: 9.8 g/dL — AB (ref 12.0–16.0)
MCH: 21.4 pg — ABNORMAL LOW (ref 26.0–34.0)
MCHC: 29.3 g/dL — ABNORMAL LOW (ref 32.0–36.0)
MCV: 73 fL — ABNORMAL LOW (ref 80.0–100.0)
PLATELETS: 312 10*3/uL (ref 150–440)
RBC: 4.59 MIL/uL (ref 3.80–5.20)
RDW: 17.5 % — ABNORMAL HIGH (ref 11.5–14.5)
WBC: 10.5 10*3/uL (ref 3.6–11.0)

## 2014-12-27 LAB — COMPREHENSIVE METABOLIC PANEL
ALBUMIN: 3.7 g/dL (ref 3.5–5.0)
ALT: 22 U/L (ref 14–54)
AST: 29 U/L (ref 15–41)
Alkaline Phosphatase: 96 U/L (ref 38–126)
Anion gap: 10 (ref 5–15)
BUN: 22 mg/dL — AB (ref 6–20)
CHLORIDE: 92 mmol/L — AB (ref 101–111)
CO2: 45 mmol/L — AB (ref 22–32)
CREATININE: 1.73 mg/dL — AB (ref 0.44–1.00)
Calcium: 9.2 mg/dL (ref 8.9–10.3)
GFR calc non Af Amer: 29 mL/min — ABNORMAL LOW (ref >60)
GFR, EST AFRICAN AMERICAN: 34 mL/min — AB (ref >60)
GLUCOSE: 150 mg/dL — AB (ref 65–99)
Potassium: 3.3 mmol/L — ABNORMAL LOW (ref 3.5–5.1)
SODIUM: 147 mmol/L — AB (ref 135–145)
Total Bilirubin: 0.5 mg/dL (ref 0.3–1.2)
Total Protein: 7.4 g/dL (ref 6.5–8.1)

## 2014-12-27 LAB — BLOOD GAS, ARTERIAL
ALLENS TEST (PASS/FAIL): POSITIVE — AB
ALLENS TEST (PASS/FAIL): POSITIVE — AB
Acid-Base Excess: 26.2 mmol/L — ABNORMAL HIGH (ref 0.0–3.0)
Acid-Base Excess: 21.5 mmol/L — ABNORMAL HIGH (ref 0.0–3.0)
Bicarbonate: 50.6 mEq/L — ABNORMAL HIGH (ref 21.0–28.0)
Bicarbonate: 50 mEq/L — ABNORMAL HIGH (ref 21.0–28.0)
FIO2: 0.44
FIO2: 0.4
O2 Saturation: 99.6 %
O2 Saturation: 91.4 %
PATIENT TEMPERATURE: 37
PCO2 ART: 72 mmHg — AB (ref 32.0–48.0)
PH ART: 7.64 — AB (ref 7.350–7.450)
PO2 ART: 149 mmHg — AB (ref 83.0–108.0)
PO2 ART: 59 mmHg — AB (ref 83.0–108.0)
Patient temperature: 37
pCO2 arterial: 47 mmHg (ref 32.0–48.0)
pH, Arterial: 7.45 (ref 7.350–7.450)

## 2014-12-27 LAB — APTT: aPTT: 30 seconds (ref 24–36)

## 2014-12-27 LAB — MAGNESIUM: MAGNESIUM: 2 mg/dL (ref 1.7–2.4)

## 2014-12-27 LAB — TROPONIN I
Troponin I: 0.08 ng/mL — ABNORMAL HIGH (ref <0.031)
Troponin I: 0.07 ng/mL — ABNORMAL HIGH (ref <0.031)

## 2014-12-27 LAB — GLUCOSE, CAPILLARY: GLUCOSE-CAPILLARY: 415 mg/dL — AB (ref 65–99)

## 2014-12-27 MED ORDER — SODIUM CHLORIDE 0.9 % IJ SOLN
3.0000 mL | Freq: Two times a day (BID) | INTRAMUSCULAR | Status: DC
  Administered 2014-12-27 – 2015-01-03 (×5): 3 mL via INTRAVENOUS

## 2014-12-27 MED ORDER — ONDANSETRON HCL 4 MG PO TABS: 4.0000 mg | ORAL_TABLET | Freq: Four times a day (QID) | ORAL | Status: DC | PRN

## 2014-12-27 MED ORDER — HEPARIN (PORCINE) IN NACL 100-0.45 UNIT/ML-% IJ SOLN
1350.0000 [IU]/h | INTRAMUSCULAR | Status: DC
  Administered 2014-12-27 – 2014-12-28 (×2): 1350 [IU]/h via INTRAVENOUS
  Filled 2014-12-27 (×4): qty 250

## 2014-12-27 MED ORDER — ALBUTEROL SULFATE (2.5 MG/3ML) 0.083% IN NEBU
2.5000 mg | INHALATION_SOLUTION | RESPIRATORY_TRACT | Status: DC | PRN
  Administered 2015-01-02: 04:00:00 2.5 mg via RESPIRATORY_TRACT
  Filled 2014-12-27: qty 3

## 2014-12-27 MED ORDER — METHYLPREDNISOLONE SODIUM SUCC 125 MG IJ SOLR
60.0000 mg | Freq: Four times a day (QID) | INTRAMUSCULAR | Status: DC
  Administered 2014-12-27 – 2014-12-28 (×2): 60 mg via INTRAVENOUS
  Filled 2014-12-27 (×2): qty 2

## 2014-12-27 MED ORDER — SODIUM CHLORIDE 0.9 % IV BOLUS (SEPSIS)
500.0000 mL | INTRAVENOUS | Status: AC
  Administered 2014-12-27: 500 mL via INTRAVENOUS

## 2014-12-27 MED ORDER — SODIUM CHLORIDE 0.45 % IV SOLN
INTRAVENOUS | Status: DC
  Administered 2014-12-27 – 2014-12-28 (×3): via INTRAVENOUS

## 2014-12-27 MED ORDER — IPRATROPIUM-ALBUTEROL 0.5-2.5 (3) MG/3ML IN SOLN
3.0000 mL | Freq: Four times a day (QID) | RESPIRATORY_TRACT | Status: DC
  Administered 2014-12-27: 3 mL via RESPIRATORY_TRACT
  Filled 2014-12-27: qty 3

## 2014-12-27 MED ORDER — POTASSIUM CHLORIDE CRYS ER 20 MEQ PO TBCR
40.0000 meq | EXTENDED_RELEASE_TABLET | Freq: Once | ORAL | Status: AC
  Administered 2014-12-27: 23:00:00 40 meq via ORAL
  Filled 2014-12-27: qty 2

## 2014-12-27 MED ORDER — ACETAMINOPHEN 325 MG PO TABS
650.0000 mg | ORAL_TABLET | Freq: Four times a day (QID) | ORAL | Status: DC | PRN
  Administered 2014-12-30: 22:00:00 650 mg via ORAL
  Filled 2014-12-27: qty 2

## 2014-12-27 MED ORDER — SODIUM CHLORIDE 0.9 % IV SOLN: 250.0000 mL | INTRAVENOUS | Status: DC | PRN

## 2014-12-27 MED ORDER — SODIUM CHLORIDE 0.9 % IJ SOLN: 3.0000 mL | Freq: Two times a day (BID) | INTRAMUSCULAR | Status: DC

## 2014-12-27 MED ORDER — ACETAMINOPHEN 650 MG RE SUPP: 650.0000 mg | Freq: Four times a day (QID) | RECTAL | Status: DC | PRN

## 2014-12-27 MED ORDER — INSULIN ASPART 100 UNIT/ML ~~LOC~~ SOLN
0.0000 [IU] | Freq: Three times a day (TID) | SUBCUTANEOUS | Status: DC
  Administered 2014-12-28: 9 [IU] via SUBCUTANEOUS
  Administered 2014-12-28: 5 [IU] via SUBCUTANEOUS
  Administered 2014-12-29: 7 [IU] via SUBCUTANEOUS
  Administered 2014-12-29: 09:00:00 5 [IU] via SUBCUTANEOUS
  Filled 2014-12-27: qty 9
  Filled 2014-12-27 (×2): qty 5

## 2014-12-27 MED ORDER — LEVALBUTEROL HCL 1.25 MG/0.5ML IN NEBU
1.2500 mg | INHALATION_SOLUTION | RESPIRATORY_TRACT | Status: DC
  Administered 2014-12-27: 1.25 mg via RESPIRATORY_TRACT
  Filled 2014-12-27: qty 0.5

## 2014-12-27 MED ORDER — INSULIN ASPART 100 UNIT/ML ~~LOC~~ SOLN
0.0000 [IU] | Freq: Every day | SUBCUTANEOUS | Status: DC
  Administered 2014-12-27: 23:00:00 5 [IU] via SUBCUTANEOUS
  Administered 2014-12-28: 22:00:00 3 [IU] via SUBCUTANEOUS
  Filled 2014-12-27: qty 7
  Filled 2014-12-27: qty 3
  Filled 2014-12-27 (×2): qty 5

## 2014-12-27 MED ORDER — HEPARIN BOLUS VIA INFUSION
4800.0000 [IU] | Freq: Once | INTRAVENOUS | Status: AC
  Administered 2014-12-27: 20:00:00 4800 [IU] via INTRAVENOUS
  Filled 2014-12-27: qty 4800

## 2014-12-27 MED ORDER — SODIUM CHLORIDE 0.9 % IJ SOLN: 3.0000 mL | INTRAMUSCULAR | Status: DC | PRN

## 2014-12-27 MED ORDER — ASPIRIN 81 MG PO CHEW
81.0000 mg | CHEWABLE_TABLET | Freq: Every day | ORAL | Status: DC
  Administered 2014-12-28 – 2015-01-03 (×7): 81 mg via ORAL
  Filled 2014-12-27 (×7): qty 1

## 2014-12-27 MED ORDER — ONDANSETRON HCL 4 MG/2ML IJ SOLN: 4.0000 mg | Freq: Four times a day (QID) | INTRAMUSCULAR | Status: DC | PRN
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
## 2014-12-27 NOTE — ED Notes (Signed)
Dr. York Cerise notified of abnormal ABG and troponin results.  No orders obtained.  Nurse will continue to monitor.
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
## 2014-12-27 NOTE — Consult Note (Addendum)
ANTICOAGULATION CONSULT NOTE - Initial Consult  Pharmacy Consult for heparin Indication: pulmonary embolus (suspected)  Allergies  Allergen Reactions  . Nickel Other (See Comments)  . Simvastatin Other (See Comments)    Patient Measurements: Height:  (165.1 cm) Weight: 230 lb (104.327 kg) IBW/kg (Calculated) : 57 Heparin Dosing Weight: 81.2  Vital Signs: Temp: 98.4 F (36.9 C) (08/16 1448) Temp Source: Oral (08/16 1448) BP: 134/76 mmHg (08/16 1603) Pulse Rate: 107 (08/16 1603)  Labs:  Recent Labs  12/27/14 1454  HGB 9.8*  HCT 33.5*  PLT 312  CREATININE 1.73*  TROPONINI 0.08*    Estimated Creatinine Clearance: 37.8 mL/min (by C-G formula based on Cr of 1.73).   Medical History: Past Medical History  Diagnosis Date  . COPD (chronic obstructive pulmonary disease)   . Chronic respiratory failure   . Diabetes mellitus without complication   . Chronic diastolic heart failure   . Hypertension   . 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 Heparin level for 0200. Will follow up.  Aliayah Tyer A. [place], [place].D. Clinical Pharmacist 12/27/2014,4:55 PM

## 2014-12-27 NOTE — ED Notes (Signed)
In and out completed per policy and procedure.  Assisted by Lorella Nimrod.

## 2014-12-27 NOTE — H&P (Signed)
[facility] - [place] at [facility]   PATIENT NAME: Rhonda Garner    MR#:  [id number]  DATE OF BIRTH:  Feb 20, 1946  DATE OF ADMISSION:  12/27/2014  PRIMARY CARE PHYSICIAN: Sherrie Mustache, MD   REQUESTING/REFERRING PHYSICIAN: Dr. York Cerise.  CHIEF COMPLAINT:   Chief Complaint  Patient presents with  . Respiratory Distress   Shortness of breath today. HISTORY OF PRESENT ILLNESS:  Rhonda Garner  is a 69 y.o. female with a known history of chronic respiratory failure on home oxygen 3-5 L, COPD, hypertension, diabetes, CK D stage III and chronic diastolic CHF. The patient has chronic respiratory failure on home oxygen. She started to have a worsening shortness of breath today.  EMS reported that the patient oxygen level was 67 to 70s in room air. She was treated with IV Solu-Medrol and DuoNeb twice and sent to the ED for further evaluation.  Her O2 saturation is at 80s with oxygen 6 L by nasal cannular in the ED. Chest x-ray showed worsening right lung mass. ABG show pH 7.45, PCO2 72 and a PCO2 59. Since. The patient has severe hypoxia, she was suspected to have a PE and this started heparin drip.since patient has CKD, she cannot get CT angiogram of the chest at this time.  PAST MEDICAL HISTORY:   Past Medical History  Diagnosis Date  . COPD (chronic obstructive pulmonary disease)   . Chronic respiratory failure   . Diabetes mellitus without complication   . Chronic diastolic heart failure   . Hypertension   . 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 Cholecystectomy      SOCIAL HISTORY:   Social History  Substance Use Topics  . Smoking status: Former Smoker -- 3.00 packs/day for 20 years  . Smokeless tobacco: Not on file  . Alcohol Use: No    FAMILY HISTORY:   Family History  Problem Relation Age of Onset  .  Hypertension Mother   . Hypertension Father     DRUG ALLERGIES:   Allergies  Allergen Reactions  . Nickel Other (See Comments)  . Simvastatin Other (See Comments)    REVIEW OF SYSTEMS:  CONSTITUTIONAL: No fever,hass.  EYES: No blurred or double vision.  EARS, NOSE, AND THROAT: No tinnitus or ear pain.  RESPIRATORY: No cough,  but has shortness of breath, wheezing, no hemoptysis.  CARDIOVASCULAR: No chest pain, orthopnea, edema.  GASTROINTESTINAL: No nausea, vomiting, diarrhea or abdominal pain.  GENITOURINARY: No dysuria, hematuria.  ENDOCRINE: No polyuria, nocturia,  HEMATOLOGY: No anemia, easy bruising or bleeding SKIN: No rash or lesion. MUSCULOSKELETAL: No joint pain or arthritis.   NEUROLOGIC: No tingling, numbness, weakness.  PSYCHIATRY: No anxiety or depression.   MEDICATIONS AT HOME:   Prior to Admission medications   Medication Sig Start Date End Date Taking? Authorizing Provider  amLODipine (NORVASC) 5 MG tablet Take 5 mg by mouth daily.   Yes Historical Provider, MD  aspirin 81 MG tablet Take 81 mg by mouth daily.   Yes Historical Provider, MD  COMBIVENT RESPIMAT 20-100 MCG/ACT AERS respimat 1 puff 4 (four) times daily.  12/30/12  Yes Historical Provider, MD  furosemide (LASIX) 40 MG tablet Take 40 mg by mouth. Takes 2 tablets am and 1 tablet pm daily.   Yes Historical Provider, MD  pravastatin (PRAVACHOL) 40 MG tablet Take 40 mg by  mouth daily.   Yes Historical Provider, MD  quinapril (ACCUPRIL) 40 MG tablet Take 40 mg by mouth 2 (two) times daily.    Yes Historical Provider, MD  Vitamin D, Ergocalciferol, (DRISDOL) 50000 UNITS CAPS capsule Take 50,000 Units by mouth every 7 (seven) days.   Yes Historical Provider, MD  acetaminophen (TYLENOL) 325 MG tablet Take 325 mg by mouth every 4 (four) hours as needed for pain.    Historical Provider, MD      VITAL SIGNS:  Blood pressure 134/76, pulse 107, temperature 98.4 F (36.9 C), temperature source Oral, resp. rate  28, height 5\' 5"  (1.651 m), weight 104.327 kg (230 lb), SpO2 92 %.  PHYSICAL EXAMINATION:  GENERAL:  69 y.o.-year-old patient lying in the bed with no acute distress. Obese. EYES: Pupils equal, round, reactive to light and accommodation. No scleral icterus. Extraocular muscles intact.  HEENT: Head atraumatic, normocephalic. Oropharynx and nasopharynx clear. Moist oral mucosa.  NECK:  Supple, no jugular venous distention. No thyroid enlargement, no tenderness.  LUNGS: Normal breath sounds bilaterally, no wheezing, rales,rhonchi or crepitation. No use of accessory muscles of respiration.  CARDIOVASCULAR: S1, S2 normal. No murmurs, rubs, or gallops.  ABDOMEN: Soft, nontender, nondistended. Bowel sounds present. No organomegaly or mass.  EXTREMITIES: No pedal edema, cyanosis, or clubbing.  NEUROLOGIC: Cranial nerves II through XII are intact. Muscle strength 5/5 in all extremities. Sensation intact. Gait not checked.  PSYCHIATRIC: The patient is alert and oriented x 3.  SKIN: No obvious rash, lesion, or ulcer.   LABORATORY PANEL:   CBC  Recent Labs Lab 12/27/14 1454  WBC 10.5  HGB 9.8*  HCT 33.5*  PLT 312   ------------------------------------------------------------------------------------------------------------------  Chemistries   Recent Labs Lab 12/27/14 1454  NA 147*  K 3.3*  CL 92*  CO2 45*  GLUCOSE 150*  BUN 22*  CREATININE 1.73*  CALCIUM 9.2  AST 29  ALT 22  ALKPHOS 96  BILITOT 0.5   ------------------------------------------------------------------------------------------------------------------  Cardiac Enzymes  Recent Labs Lab 12/27/14 1454  TROPONINI 0.08*   ------------------------------------------------------------------------------------------------------------------  RADIOLOGY:  Dg Chest Portable 1 View  12/27/2014   CLINICAL DATA:  Increase shortness of breath, hypoxemia beginning last night.  EXAM: PORTABLE CHEST - 1 VIEW  COMPARISON:   10/10/2013  FINDINGS: There is cardiomegaly. Continued right basilar opacity, enlarged since prior study. Cannot exclude enlarging mass/cancer in the right lung base as described on prior CT. No overt edema. No visible effusions. No acute bony abnormality.  IMPRESSION: Worsening masslike opacity at the right lung base. Cannot exclude lung cancer as described on prior chest CT.  Cardiomegaly.   Electronically Signed   By: Charlett Nose M.D.   On: 12/27/2014 15:28    EKG:   Orders placed or performed during the hospital encounter of 12/27/14  . EKG test  . EKG test    IMPRESSION AND PLAN:   Acute on chronic respiratory failure with hypoxia and hypercapnia COPD exacerbation Lung mass Questionable PE Elevated troponin, possible due to demanding ischemia Hypernatremia  Hypokalemia  CKD stage III Hypertension Diabetes Anemia of chronic disease Morbid obesity  The patient will be admitted to medical floor with telemetry monitor. I will start IV Solu-Medrol, Xopenex and other nebulizer treatment. Continue oxygen by nasal cannular. Pulmonary consult.  For questionable PE, I will continue heparin drip, start IV fluid to support and follow-up BMP. The patient may get CT angiogram of the chest to rule out PE and evaluation of lung mass if her renal function is  better. I also will get oncology consult.  For elevated troponin, follow-up troponin level and continue heparin drip and add aspirin.  I will give potassium supplement and check magnesium level.  4 hypernatremia, I will start half-normal saline and follow-up BMP.   Continue hypertension medication. Start Sliding scale.   All the records are reviewed and case discussed with ED provider. Management plans discussed with the patient, family and they are in agreement.  CODE STATUS: Full code  TOTAL TIME TAKING CARE OF THIS PATIENT: 66 minutes.    Shaune Pollack M.D on 12/27/2014 at 5:04 PM  Between 7am to 6pm - Pager -  [phone]  After 6pm go to [email or web address] - password [email or web address] [facility]  [facility]  Office  [phone]  CC: Primary care physician; Sherrie Mustache, MD

## 2014-12-28 ENCOUNTER — Encounter: Payer: Self-pay | Admitting: Radiology

## 2014-12-28 ENCOUNTER — Inpatient Hospital Stay: Payer: Medicare Other

## 2014-12-28 DIAGNOSIS — E119 Type 2 diabetes mellitus without complications: Secondary | ICD-10-CM

## 2014-12-28 DIAGNOSIS — R918 Other nonspecific abnormal finding of lung field: Secondary | ICD-10-CM

## 2014-12-28 DIAGNOSIS — I129 Hypertensive chronic kidney disease with stage 1 through stage 4 chronic kidney disease, or unspecified chronic kidney disease: Secondary | ICD-10-CM

## 2014-12-28 DIAGNOSIS — D509 Iron deficiency anemia, unspecified: Secondary | ICD-10-CM

## 2014-12-28 DIAGNOSIS — R5383 Other fatigue: Secondary | ICD-10-CM

## 2014-12-28 DIAGNOSIS — Z9981 Dependence on supplemental oxygen: Secondary | ICD-10-CM

## 2014-12-28 DIAGNOSIS — J449 Chronic obstructive pulmonary disease, unspecified: Secondary | ICD-10-CM

## 2014-12-28 DIAGNOSIS — J189 Pneumonia, unspecified organism: Secondary | ICD-10-CM

## 2014-12-28 DIAGNOSIS — N183 Chronic kidney disease, stage 3 (moderate): Secondary | ICD-10-CM

## 2014-12-28 DIAGNOSIS — R531 Weakness: Secondary | ICD-10-CM

## 2014-12-28 LAB — CBC
HCT: 27.8 % — ABNORMAL LOW (ref 35.0–47.0)
HEMOGLOBIN: 8.1 g/dL — AB (ref 12.0–16.0)
MCH: 21.3 pg — ABNORMAL LOW (ref 26.0–34.0)
MCHC: 29.1 g/dL — ABNORMAL LOW (ref 32.0–36.0)
MCV: 73.2 fL — ABNORMAL LOW (ref 80.0–100.0)
PLATELETS: 238 10*3/uL (ref 150–440)
RBC: 3.81 MIL/uL (ref 3.80–5.20)
RDW: 17.6 % — ABNORMAL HIGH (ref 11.5–14.5)
WBC: 10.5 10*3/uL (ref 3.6–11.0)

## 2014-12-28 LAB — BASIC METABOLIC PANEL
ANION GAP: 11 (ref 5–15)
BUN: 35 mg/dL — ABNORMAL HIGH (ref 6–20)
CALCIUM: 8.3 mg/dL — AB (ref 8.9–10.3)
CHLORIDE: 91 mmol/L — AB (ref 101–111)
CO2: 36 mmol/L — AB (ref 22–32)
CREATININE: 2.12 mg/dL — AB (ref 0.44–1.00)
GFR calc non Af Amer: 23 mL/min — ABNORMAL LOW (ref >60)
GFR, EST AFRICAN AMERICAN: 27 mL/min — AB (ref >60)
Glucose, Bld: 443 mg/dL — ABNORMAL HIGH (ref 65–99)
Potassium: 3.7 mmol/L (ref 3.5–5.1)
SODIUM: 138 mmol/L (ref 135–145)

## 2014-12-28 LAB — GLUCOSE, CAPILLARY
GLUCOSE-CAPILLARY: 416 mg/dL — AB (ref 65–99)
GLUCOSE-CAPILLARY: 361 mg/dL — AB (ref 65–99)
GLUCOSE-CAPILLARY: 265 mg/dL — AB (ref 65–99)
Glucose-Capillary: 398 mg/dL — ABNORMAL HIGH (ref 65–99)
Glucose-Capillary: 265 mg/dL — ABNORMAL HIGH (ref 65–99)

## 2014-12-28 LAB — HEPARIN LEVEL (UNFRACTIONATED)
Heparin Unfractionated: 0.61 IU/mL (ref 0.30–0.70)
Heparin Unfractionated: 0.41 IU/mL (ref 0.30–0.70)

## 2014-12-28 LAB — TROPONIN I
TROPONIN I: 0.07 ng/mL — AB (ref <0.031)
TROPONIN I: 0.06 ng/mL — AB (ref <0.031)

## 2014-12-28 LAB — HEMOGLOBIN A1C: HEMOGLOBIN A1C: 6.8 % — AB (ref 4.0–6.0)

## 2014-12-28 MED ORDER — LEVALBUTEROL HCL 1.25 MG/0.5ML IN NEBU
1.2500 mg | INHALATION_SOLUTION | RESPIRATORY_TRACT | Status: DC
  Administered 2014-12-28 – 2014-12-31 (×23): 1.25 mg via RESPIRATORY_TRACT
  Filled 2014-12-28 (×24): qty 0.5

## 2014-12-28 MED ORDER — MOMETASONE FURO-FORMOTEROL FUM 200-5 MCG/ACT IN AERO
2.0000 | INHALATION_SPRAY | Freq: Two times a day (BID) | RESPIRATORY_TRACT | Status: DC
  Administered 2014-12-28 – 2015-01-03 (×12): 2 via RESPIRATORY_TRACT
  Filled 2014-12-28: qty 8.8

## 2014-12-28 MED ORDER — ENOXAPARIN SODIUM 40 MG/0.4ML ~~LOC~~ SOLN
40.0000 mg | Freq: Every day | SUBCUTANEOUS | Status: DC
  Administered 2014-12-29: 09:00:00 40 mg via SUBCUTANEOUS
  Filled 2014-12-28: qty 0.4

## 2014-12-28 MED ORDER — INSULIN ASPART 100 UNIT/ML ~~LOC~~ SOLN
15.0000 [IU] | Freq: Once | SUBCUTANEOUS | Status: AC
  Administered 2014-12-28: 08:00:00 15 [IU] via SUBCUTANEOUS
  Filled 2014-12-28: qty 15

## 2014-12-28 MED ORDER — VITAMIN D (ERGOCALCIFEROL) 1.25 MG (50000 UNIT) PO CAPS
50000.0000 [IU] | ORAL_CAPSULE | ORAL | Status: DC
  Administered 2014-12-28: 50000 [IU] via ORAL
  Filled 2014-12-28: qty 1

## 2014-12-28 MED ORDER — TIOTROPIUM BROMIDE MONOHYDRATE 18 MCG IN CAPS
18.0000 ug | ORAL_CAPSULE | Freq: Every day | RESPIRATORY_TRACT | Status: DC
  Administered 2014-12-29 – 2015-01-03 (×6): 18 ug via RESPIRATORY_TRACT
  Filled 2014-12-28 (×2): qty 5

## 2014-12-28 MED ORDER — ASPIRIN 81 MG PO TABS: 81.0000 mg | ORAL_TABLET | Freq: Every day | ORAL | Status: DC

## 2014-12-28 MED ORDER — PRAVASTATIN SODIUM 20 MG PO TABS
40.0000 mg | ORAL_TABLET | Freq: Every day | ORAL | Status: DC
  Administered 2014-12-29 – 2015-01-03 (×6): 40 mg via ORAL
  Filled 2014-12-28 (×6): qty 2

## 2014-12-28 MED ORDER — METHYLPREDNISOLONE SODIUM SUCC 125 MG IJ SOLR
60.0000 mg | Freq: Two times a day (BID) | INTRAMUSCULAR | Status: DC
  Administered 2014-12-28 – 2014-12-30 (×4): 60 mg via INTRAVENOUS
  Filled 2014-12-28 (×4): qty 2

## 2014-12-28 MED ORDER — INSULIN GLARGINE 100 UNIT/ML ~~LOC~~ SOLN
15.0000 [IU] | Freq: Every day | SUBCUTANEOUS | Status: DC
  Administered 2014-12-28: 22:00:00 15 [IU] via SUBCUTANEOUS
  Filled 2014-12-28 (×2): qty 0.15

## 2014-12-28 MED ORDER — AMLODIPINE BESYLATE 5 MG PO TABS
5.0000 mg | ORAL_TABLET | Freq: Every day | ORAL | Status: DC
  Administered 2014-12-28 – 2015-01-03 (×7): 5 mg via ORAL
  Filled 2014-12-28 (×7): qty 1

## 2014-12-28 MED ORDER — INSULIN ASPART 100 UNIT/ML ~~LOC~~ SOLN
5.0000 [IU] | Freq: Three times a day (TID) | SUBCUTANEOUS | Status: DC
  Administered 2014-12-28 – 2014-12-30 (×6): 5 [IU] via SUBCUTANEOUS
  Filled 2014-12-28 (×5): qty 5

## 2014-12-28 NOTE — Care Management (Signed)
Admitted to [facility] with the diagnosis of acute/chronic respiratory failure. Lives alone Family member is Latrelle Dodrill (nephew) [phone]. Sees Dr. Dario Guardian. States she hasn't seen Dr. Dario Guardian in awhile. No home Health. No skilled facility. Home oxygen x 10 years. Doesn't remember name of company. States she takes care of all activities of daily living, still drives. Uses no aids for ambulation.  Gwenette Greet RN MSN Care Management [phone]

## 2014-12-28 NOTE — Consult Note (Signed)
ANTICOAGULATION CONSULT NOTE - Initial Consult  Pharmacy Consult for heparin Indication: pulmonary embolus (suspected)  Allergies  Allergen Reactions  . Nickel Other (See Comments)  . Simvastatin Other (See Comments)    Patient Measurements: Height:  (162.6 cm) Weight: 217 lb 8 oz (98.657 kg) IBW/kg (Calculated) : 54.7 Heparin Dosing Weight: 81.2  Vital Signs: Temp: 98.7 F (37.1 C) (08/16 2131) Temp Source: Oral (08/16 2131) BP: 136/58 mmHg (08/16 2131) Pulse Rate: 114 (08/16 2131)  Labs:  Recent Labs  12/27/14 1454 12/27/14 1717 12/27/14 1805 12/28/14 0108  HGB 9.8*  --   --   --   HCT 33.5*  --   --   --   PLT 312  --   --   --   APTT  --  30  --   --   LABPROT  --  13.5  --   --   INR  --  1.01  --   --   HEPARINUNFRC  --   --   --  0.61  CREATININE 1.73*  --   --   --   TROPONINI 0.08*  --  0.07* 0.07*    Estimated Creatinine Clearance: 36 mL/min (by C-G formula based on Cr of 1.73).   Medical History: Past Medical History  Diagnosis Date  . COPD (chronic obstructive pulmonary disease)   . Chronic respiratory failure   . Diabetes mellitus without complication   . Chronic diastolic heart failure   . Hypertension   . 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 Heparin level for 0200. Will follow up.  1610 0245 Heparin level 0.61 - continue current. Will check another anti-xa level in 6 hours then switch to daily monitoring.   Rondle Lohse A. Farrell, [place].D. Clinical  Pharmacist 12/28/2014,2:45 AM

## 2014-12-28 NOTE — Plan of Care (Addendum)
Problem: Discharge Progression Outcomes Goal: Dyspnea controlled Outcome: Progressing Individualization of Care Pt prefers to be called Rhonda Garner Hx of COPD, chronic respiratory failure, DM, HTN, malignant hypertensive heart and renal disease with heart failure and CKD - stage III Admitted for respiratory distress and SOB    Goal: Other Discharge Outcomes/Goals 1.  Dyspnea:  Patient dyspnea controlled this shift 2.  O2 sats:  Patient on 2L of O2 via nasal canula.  Currently stating 95%. 3.  Home O2:  Patient usually on 2-6L of O2 at home depending upon humidity levels and SOB from exertion. 4.  Independent ADL:  Patient with home health aids 4 days per week at home. 5.  Discharge plan:  Patient presented with severe hypoxia and possible PE.  She received a 4800 bolus of heparin and is on 1350 units/hour.   6.  Pain:  Patient without complaints of pain this shift. 7.  Hemodynamically:  Patient VSS this shift.  Troponins elevated, physician aware.  She continues on heparin drip and scheduled aspirin.  BP 136/58 mmHg  Pulse 114  Temp(Src) 98.7 F (37.1 C) (Oral)  Resp 18  Ht  (1.626 m)  Wt 217 lb 8 oz (98.657 kg)  BMI 37.32 kg/m2  SpO2 95% 8.  Complications:  Patient admitted with tele.  She has IV solu-medrol and ATC respiratory treatments scheduled.  She had a blood glucose level of 415 this shift.  Physician paged - responded that 5 units of insulin to be given per sliding scale. 9.  Diet:  Patient on heart healthy/carb modified diet.  She did have sugar free jello upon arrival to unit and tolerated well. 10.  Activity:  Patient is up with minimal assist to bathroom.  She does have some generalized weakness and is on fall precautions due to SOB on exertion and slightly unsteady on feet.

## 2014-12-28 NOTE — Progress Notes (Addendum)
Notified MD of BS 415, ordered to give 15 units once and recheck FS.

## 2014-12-28 NOTE — Progress Notes (Addendum)
Plan of care progress to goal for: COPD exacerbation  U/S negative for DVT. D/c'd heparin drip. Pt on 5-6L O2 [place], on home O2 as well. No c/o pain. Blood sugar elevated, MD aware.  DM coordinator consult ordered.  On IV solumedrol. Up to bathroom with standby assist.

## 2014-12-28 NOTE — Progress Notes (Signed)
Date: 12/28/2014,   MRN# [id number] Rhonda Garner 01-Dec-1945 Code Status:     Code Status Orders        Start     Ordered   12/27/14 1740  Full code   Continuous     12/27/14 1739     Hosp day:@LENGTHOFSTAYDAYS @ Referring MD: @        AdmissionWeight: 230 lb (104.327 kg)                 CurrentWeight: 217 lb 8 oz (98.657 kg) . CC: lung mass  HPI: This is a 69 yo af am lady, ex smoker, overweight, suspected sleep apnea, known to have late stage II vs III copd. On 5 liters [place] 02. I was asked to see regarding RLL expanding mass with 1.1 sub carinal mass. She use to see Dr. Neale Burly for anemia w/u. EGD was cancelled due to poor lung function. She was scheduled to see me 01/10/15.   PMHX:   Past Medical History  Diagnosis Date  . COPD (chronic obstructive pulmonary disease)   . Chronic respiratory failure   . Diabetes mellitus without complication   . Chronic diastolic heart failure   . Hypertension   . 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 Cholecystectomy     Family Hx:  Family History  Problem Relation Age of Onset  . Hypertension Mother   . Hypertension Father    Social Hx:   Social History  Substance Use Topics  . Smoking status: Former Smoker -- 3.00 packs/day for 20 years  . Smokeless tobacco: None  . Alcohol Use: No   Medication:    Home Medication:  No current outpatient prescriptions on file.  Current Medication: @   Allergies:  Nickel and Simvastatin  Review of Systems: Gen:  Denies  fever, sweats, chills HEENT: Denies blurred vision, double vision, ear pain, eye pain, hearing loss, nose bleeds, sore throat Cvc:  No dizziness, chest pain or heaviness Resp: sob, no pleurisy or chest wall pain, mild wheezing, no hemoptysis   Gi: Denies swallowing difficulty, stomach pain, nausea or vomiting, diarrhea,  constipation, bowel incontinence Gu:  Denies bladder incontinence, burning urine Ext:   No Joint pain, stiffness or swelling Skin: No skin rash, easy bruising or bleeding or hives Endoc:  No polyuria, polydipsia , polyphagia or weight change Psych: No depression, insomnia or hallucinations  Other:  All other systems negative  Physical Examination:   VS: BP 128/67 mmHg  Pulse 98  Temp(Src) 98.5 F (36.9 C) (Oral)  Resp 20  Ht  (1.626 m)  Wt 217 lb 8 oz (98.657 kg)  BMI 37.32 kg/m2  SpO2 94%  General Appearance: No distress , on 5 liters, over weight Neuro: without focal findings, mental status, speech normal, alert and oriented, cranial nerves 2-12 intact, reflexes normal and symmetric, sensation grossly normal  HEENT: PERRLA, EOM intact, no ptosis, no other lesions noticed, Mallampati: Pulmonary:.distant wheezing, No rales  - rub   Cardiovascular:  Normal S1,S2.  No m/r/g.  distant.    Abdomen:Benign, Soft, non-tender, No masses, hepatosplenomegaly, No lymphadenopathy Endoc: No evident thyromegaly, no signs of acromegaly or Cushing features Skin:   warm, no rashes, no ecchymosis  Extremities: normal, no cyanosis, clubbing, 0- trace edema, warm with normal capillary refill. Other findings:   Labs results:   Recent Labs  12/27/14  1454  12/27/14  1717  12/28/14  0109  HGB  9.8*   --   8.1*  HCT  33.5*   --   27.8*  MCV  73.0*   --   73.2*  WBC  10.5   --   10.5  BUN  22*   --   35*  CREATININE  1.73*   --   2.12*  GLUCOSE  150*   --   443*  CALCIUM  9.2   --   8.3*  INR   --   1.01   --   ,  SPIROMETRY: FVC was 1.34 liters, 62% of predicted/Post 1.37, 63%, 2% change FEV1 was 1.01, 58% of predicted/Post 1.07, 61%, 5% change FEV1 ratio was 76/Post 78 FEF 25-75% liters per second was 38% of predicted/Post 46, 21% change  *SVN given with 2.5 mg Albuterol for Post Spirometry.  LUNG VOLUMES: TLC was 67% of predicted RV was 94% of predicted  DIFFUSION  CAPACITY: DLCO was 35% of predicted DLCO/VA was 101% of predicted  FLOW VOLUME LOOP: Exp flow volume loop is delayed  SPO2 At Rest: 91% SPO2 With Exertion: 85% SPO2 On Recovery On Oxygen 2 LPM With Exertion: 92%  Rad results:   Ct Chest Wo Contrast  12/28/2014   CLINICAL DATA:  Worsening right lung mass. Shortness of breath likely due to COPD exacerbation. Symptoms since 12/27/2014.  EXAM: CT CHEST WITHOUT CONTRAST  TECHNIQUE: Multidetector CT imaging of the chest was performed following the standard protocol without IV contrast.  COMPARISON:  Plain film of the chest 12/27/2014 and CT chest 03/16/2014.  FINDINGS: A subcarinal lymph node to the right on image 26 measures 1.1 cm short axis dimension. No axillary lymphadenopathy is identified. No pleural or pericardial effusion. Cardiomegaly is noted. Mild aortic atherosclerosis is identified.  The lungs demonstrate changes of centrilobular emphysema. Mass in the right lower lobe which had measured 4.2 x 3.1 cm has increased in size to 5.7 x 4.6 cm on image 36. The mass abuts the pleural surface of the lateral chest and right hemidiaphragm. A few small calcifications are identified in the mass, new since the prior exam. Mild basilar atelectasis is noted. Scarring in the periphery of the lingula is unchanged.  Incidentally imaged upper abdomen including the adrenal glands is unremarkable.  IMPRESSION: Interval increase in the size of a right lower lobe pulmonary mass in this patient with emphysema is most worrisome for bronchogenic carcinoma. 1.1 cm subcarinal lymph node may be incidental but could be mildly enlarged due to metastatic disease.  Cardiomegaly without edema.   Electronically Signed   By: Drusilla Kanner M.D.   On: 12/28/2014 14:51   US Venous Img Lower Bilateral  12/28/2014   CLINICAL DATA:  Shortness of Breath  EXAM: BILATERAL LOWER EXTREMITY VENOUS DOPPLER ULTRASOUND  TECHNIQUE: Gray-scale sonography with compression, as well as color  and duplex ultrasound, were performed to evaluate the deep venous system from the level of the common femoral vein through the popliteal and proximal calf veins.  COMPARISON:  None  FINDINGS: Normal compressibility of the common femoral, superficial femoral, and popliteal veins, as well as the proximal calf veins. No filling defects to suggest DVT on grayscale or color Doppler imaging. Doppler waveforms show normal direction of venous flow, normal respiratory phasicity and response to augmentation.  IMPRESSION: 1. No evidence of lower extremity deep vein thrombosis, BILATERALLY.   Electronically Signed   By: Corlis Leak M.D.   On:  12/28/2014 15:39      Assessment and Plan: 1. 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 Use to see Dr. Neale Burly -pet scan -consider transthoracic needle biopsy -further orders per above  2. Copd, late stage II, NYHC III -LABA, ICS, anticholinergic, B 2 agonist (dulera, combivent, albuterol, or the equivalent)  3. Suspect sleep apnea/obesity, out patient w/u was in progress -out patient follow up -weight loss -avoid sedation    I have personally obtained a history, examined the patient, evaluated laboratory and imaging results, formulated the assessment and plan and placed orders.  The Patient requires high complexity decision making for assessment and support, frequent evaluation and titration of therapies, application of advanced monitoring technologies and extensive interpretation of multiple databases.   Orlondo Holycross,M.D. Pulmonary & Critical care Medicine [facility]

## 2014-12-28 NOTE — Consult Note (Signed)
Rhonda Garner @ [facility] [phone]  [phone]   INITIAL CONSULT  Rhonda Garner OB: 22-Mar-1946  MR#: [id number]  WUX#:[id number]  Patient Care Team: Casilda Carls, MD as PCP - General (Internal Medicine)  CHIEF COMPLAINT:  Chief Complaint  Patient presents with  . Respiratory Distress    VISIT DIAGNOSIS:     ICD-9-CM ICD-10-CM   1. Acute on chronic respiratory failure with hypoxemia 518.84 J96.21   2. Lung mass 786.6 R91.8 CT Chest Wo Contrast     CT Chest Wo Contrast  3. Acute exacerbation of chronic obstructive pulmonary disease (COPD) 491.21 J44.1   4. Metabolic alkalosis with respiratory acidosis 276.4 E87.4   5. Shortness of breath 786.05 R06.02 US Venous Img Lower Bilateral     US Venous Img Lower Bilateral  6. Right lower lobe lung mass 786.6 J98.4 NM PET Image Initial (PI) Whole Body     NM PET Image Initial (PI) Whole Body      No history exists.    Oncology Flowsheet 12/27/2014 12/28/2014 12/28/2014  methylPREDNISolone sodium succinate 125 mg/2 mL (SOLU-MEDROL) IV 60 mg 60 mg 60 mg   Subjective: Chief Complaint/Diagnosis:   1. IDA, PRIOR BLOOD IN STOOL 08/2013, GI W/U RECOMMENDED, , ANEMIA WITH HGB 8.1, MCV 64, FROM PRIMARY CARE 10/18/13.[name]  GI F/U WITH DR Vira Agar , RECOMMENDED NOT A CANDIDATE FOR LUMINAL STUDIES, AND PATIENT DID NOT WANT INTERVENTION, ,   2. 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 MEDICAL PROBLEMS INCLUDE CRF, COPD, 02 DEPENDENT, DM 4. 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 , HAS SOBOE CHRONICALLY, NO CP, NO ORTHOSTASIS NO DIZZINESS, NO PALPITATION.  GETS SOME SOBOE STABLE, NO ORTHOSTASIS NO DIZZINESS NO PALPITATION. HGB INCREASED ON PO IRON, PATIENT HAS NOT WANTED IV IRON, SHE DOES TOLERATE ANEMIA VERY WELL.  TODAY, ONGOING AND  ACUTE ISSUES , ,RECENT CT NON CONTRAST, RIGHT LUNG MASS, SIMILAR TO CXR APPEARANCE FROM 09/2013, NEW SINCE CXR 08/2013, HIGHLY LIKELY TO BE LUNG CANCER. NO CP,NO HEMOPTYSIS, NO PLEURISY,   INTERVAL HISTORY:  Patient was admitted in the hospital with increasing shortness of breath.  Was admitted with a diagnosis of pneumonia and to rule out pulmonary embolism.  Was started on IV antibiotic oxygen.  In bronchodilator therapy had a repeat CT scan which shows persistent right lower lobe mass.  Which in comparison to the previous mass has slightly increased. Previously patient had refused any further workup as documented by Dr. Suszanne Conners in his note in January of 2016 According to patient's is feeling better. REVIEW OF SYSTEMS:     general status: Patient is feeling weak and tired.  No change in a performance status.  No chills.  No fever. HEENT:No evidence of stomatitis Lungs: Increasing shortness of breath.  No fever no chills Cardiac: No chest pain or paroxysmal nocturnal dyspnea GI: No nausea no vomiting no diarrhea no abdominal pain Skin: No rash Lower extremity no swelling Neurological system: No tingling.  No numbness.  No other focal signs Musculoskeletal system no bony pains  As per HPI. Otherwise, a complete review of systems is negatve.  PAST MEDICAL HISTORY: Past Medical History  Diagnosis Date  . COPD (chronic obstructive pulmonary disease)   . Chronic respiratory failure   . Diabetes mellitus without complication   .  Chronic diastolic heart failure   . Hypertension   . 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 Cholecystectomy      FAMILY HISTORY Family History  Problem Relation Age of Onset  . Hypertension Mother   . Hypertension Father         ADVANCED DIRECTIVES:  No documents of advanced healthcare directive  available  HEALTH MAINTENANCE: Social History  Substance Use Topics  . Smoking status: Former Smoker -- 3.00 packs/day for 20 years  . Smokeless tobacco: None  . Alcohol Use: No    :  Allergies  Allergen Reactions  . Nickel Other (See Comments)  . Simvastatin Other (See Comments)    Current Facility-Administered Medications  Medication Dose Route Frequency Provider Last Rate Last Dose  . 0.45 % sodium chloride infusion   Intravenous Continuous Demetrios Loll, MD 75 mL/hr at 12/28/14 1007    . acetaminophen (TYLENOL) tablet 650 mg  650 mg Oral Q6H PRN Demetrios Loll, MD       Or  . acetaminophen (TYLENOL) suppository 650 mg  650 mg Rectal Q6H PRN Demetrios Loll, MD      . albuterol (PROVENTIL) (2.5 MG/3ML) 0.083% nebulizer solution 2.5 mg  2.5 mg Nebulization Q2H PRN Demetrios Loll, MD      . amLODipine (NORVASC) tablet 5 mg  5 mg Oral Daily Henreitta Leber, MD   5 mg at 12/28/14 1223  . aspirin chewable tablet 81 mg  81 mg Oral Daily Demetrios Loll, MD   81 mg at 12/28/14 1937  . [START ON 12/29/2014] enoxaparin (LOVENOX) injection 40 mg  40 mg Subcutaneous Daily Henreitta Leber, MD      . insulin aspart (novoLOG) injection 0-5 Units  0-5 Units Subcutaneous QHS Demetrios Loll, MD   5 Units at 12/27/14 2254  . insulin aspart (novoLOG) injection 0-9 Units  0-9 Units Subcutaneous TID WC Demetrios Loll, MD   5 Units at 12/28/14 1751  . insulin aspart (novoLOG) injection 5 Units  5 Units Subcutaneous TID WC Henreitta Leber, MD   5 Units at 12/28/14 1750  . insulin glargine (LANTUS) injection 15 Units  15 Units Subcutaneous QHS Henreitta Leber, MD      . levalbuterol [facility]) nebulizer solution 1.25 mg  1.25 mg Nebulization Q4H Henreitta Leber, MD   1.25 mg at 12/28/14 1943  . methylPREDNISolone sodium succinate (SOLU-MEDROL) 125 mg/2 mL injection 60 mg  60 mg Intravenous Q12H Henreitta Leber, MD   60 mg at 12/28/14 1751  . mometasone-formoterol (DULERA) 200-5 MCG/ACT inhaler 2 puff  2 puff Inhalation BID Henreitta Leber,  MD   2 puff at 12/28/14 1145  . ondansetron (ZOFRAN) tablet 4 mg  4 mg Oral Q6H PRN Demetrios Loll, MD       Or  . ondansetron [facility]) injection 4 mg  4 mg Intravenous Q6H PRN Demetrios Loll, MD      . pravastatin (PRAVACHOL) tablet 40 mg  40 mg Oral Daily Henreitta Leber, MD   40 mg at 12/28/14 1145  . sodium chloride 0.9 % injection 3 mL  3 mL Intravenous Q12H Demetrios Loll, MD   3 mL at 12/27/14 2256  . tiotropium (SPIRIVA) inhalation capsule 18 mcg  18 mcg Inhalation Daily Henreitta Leber, MD   18 mcg at 12/28/14 1145  . Vitamin D (Ergocalciferol) (  DRISDOL) capsule 50,000 Units  50,000 Units Oral Q7 days Henreitta Leber, MD   50,000 Units at 12/28/14 1219    OBJECTIVE: PHYSICAL EXAM: Patient is extremely obese individual.  In mild shortness of breath and lying in the bed. HEENT: Pale conjunctiva are no evidence of stomatitis Lymphatic system: Supraclavicular, cervical, axillary, inguinal lymph nodes are not palpable  Breast was not examined. Cardiac: Tachycardia Lungs: Rhonchi on the right side.  Diminished air entry.  Emphysematous chest. Abdomen: Soft.  Difficult to palpate because of obesity Lower  extremity swelling 1+ Neurologically, the patient was awake, alert, and oriented to person, place and time. There were no obvious focal neurologic abnormalities.  Filed Vitals:   12/28/14 1501  BP: 128/67  Pulse: 98  Temp: 98.5 F (36.9 C)  Resp: 20     Body mass index is 37.32 kg/(m^2).    ECOG FS:2 - Symptomatic, <50% confined to bed  LAB RESULTS:  Admission on 12/27/2014  Component Date Value Ref Range Status  . WBC 12/27/2014 10.5  3.6 - 11.0 K/uL Final  . RBC 12/27/2014 4.59  3.80 - 5.20 MIL/uL Final  . Hemoglobin 12/27/2014 9.8* 12.0 - 16.0 g/dL Final  . HCT 12/27/2014 33.5* 35.0 - 47.0 % Final  . MCV 12/27/2014 73.0* 80.0 - 100.0 fL Final  . MCH 12/27/2014 21.4* 26.0 - 34.0 pg Final  . MCHC 12/27/2014 29.3* 32.0 - 36.0 g/dL Final  . RDW 12/27/2014 17.5* 11.5 - 14.5 % Final  .  Platelets 12/27/2014 312  150 - 440 K/uL Final  . Sodium 12/27/2014 147* 135 - 145 mmol/L Final  . Potassium 12/27/2014 3.3* 3.5 - 5.1 mmol/L Final  . Chloride 12/27/2014 92* 101 - 111 mmol/L Final  . CO2 12/27/2014 45* 22 - 32 mmol/L Final  . Glucose, Bld 12/27/2014 150* 65 - 99 mg/dL Final  . BUN 12/27/2014 22* 6 - 20 mg/dL Final  . Creatinine, Ser 12/27/2014 1.73* 0.44 - 1.00 mg/dL Final  . Calcium 12/27/2014 9.2  8.9 - 10.3 mg/dL Final  . Total Protein 12/27/2014 7.4  6.5 - 8.1 g/dL Final  . Albumin 12/27/2014 3.7  3.5 - 5.0 g/dL Final  . AST 12/27/2014 29  15 - 41 U/L Final  . ALT 12/27/2014 22  14 - 54 U/L Final  . Alkaline Phosphatase 12/27/2014 96  38 - 126 U/L Final  . Total Bilirubin 12/27/2014 0.5  0.3 - 1.2 mg/dL Final  . GFR calc non Af Amer 12/27/2014 29* >60 mL/min Final  . GFR calc Af Amer 12/27/2014 34* >60 mL/min Final   Comment: (NOTE) The eGFR has been calculated using the CKD EPI equation. This calculation has not been validated in all clinical situations. eGFR's persistently <60 mL/min signify possible Chronic Kidney Disease.   . Anion gap 12/27/2014 10  5 - 15 Final  . Troponin I 12/27/2014 0.08* <0.031 ng/mL Final   Comment: READ BACK AND VERIFIED RACHEL VERDI 12/27/2014 1539 [place]        PERSISTENTLY INCREASED TROPONIN VALUES IN THE RANGE OF 0.04-0.49 ng/mL CAN BE SEEN IN:       -UNSTABLE ANGINA       -CONGESTIVE HEART FAILURE       -MYOCARDITIS       -CHEST TRAUMA       -ARRYHTHMIAS       -LATE PRESENTING MYOCARDIAL INFARCTION       -COPD   CLINICAL FOLLOW-UP RECOMMENDED.   [name] FIO2 12/27/2014 0.44   Final  .  Delivery systems 12/27/2014 NASAL CANNULA   Final  . pH, Arterial 12/27/2014 7.64* 7.350 - 7.450 Final   Comment: CRITICAL RESULT CALLED TO, READ BACK BY AND VERIFIED WITH: CRITICAL VALUE CALLED TO RACHEL VERDI RN AT 12/27/14 1531   . pCO2 arterial 12/27/2014 47  32.0 - 48.0 mmHg Final  . pO2, Arterial 12/27/2014 149* 83.0 - 108.0 mmHg Final  .  Bicarbonate 12/27/2014 50.6* 21.0 - 28.0 mEq/L Final  . Acid-Base Excess 12/27/2014 26.2* 0.0 - 3.0 mmol/L Final  . O2 Saturation 12/27/2014 99.6   Final  . Patient temperature 12/27/2014 37.0   Final  . Collection site 12/27/2014 RIGHT RADIAL   Final  . Sample type 12/27/2014 ARTERIAL DRAW   Final  . Allens test (pass/fail) 12/27/2014 POSITIVE* PASS Final  . Color, Urine 12/27/2014 YELLOW* YELLOW Final  . APPearance 12/27/2014 CLEAR* CLEAR Final  . Glucose, UA 12/27/2014 NEGATIVE  NEGATIVE mg/dL Final  . Bilirubin Urine 12/27/2014 NEGATIVE  NEGATIVE Final  . Ketones, ur 12/27/2014 TRACE* NEGATIVE mg/dL Final  . Specific Gravity, Urine 12/27/2014 1.015  1.005 - 1.030 Final  . Hgb urine dipstick 12/27/2014 NEGATIVE  NEGATIVE Final  . pH 12/27/2014 5.0  5.0 - 8.0 Final  . Protein, ur 12/27/2014 >500* NEGATIVE mg/dL Final  . Nitrite 12/27/2014 NEGATIVE  NEGATIVE Final  . Leukocytes, UA 12/27/2014 NEGATIVE  NEGATIVE Final  . RBC / HPF 12/27/2014 0-5  0 - 5 RBC/hpf Final  . WBC, UA 12/27/2014 0-5  0 - 5 WBC/hpf Final  . Bacteria, UA 12/27/2014 RARE* NONE SEEN Final  . Squamous Epithelial / LPF 12/27/2014 0-5* NONE SEEN Final  . Mucous 12/27/2014 PRESENT   Final  . Hyaline Casts, UA 12/27/2014 PRESENT   Final  . Amorphous Crystal 12/27/2014 PRESENT   Final  . FIO2 12/27/2014 0.40   Final  . Delivery systems 12/27/2014 NASAL CANNULA   Final  . pH, Arterial 12/27/2014 7.45  7.350 - 7.450 Final  . pCO2 arterial 12/27/2014 72* 32.0 - 48.0 mmHg Final   Comment: NOTIFIED PHYSICIAN CRITICAL VALUE HAND DELIVERED TO DR. Karma Greaser AT [place] [id number]   . pO2, Arterial 12/27/2014 59* 83.0 - 108.0 mmHg Final  . Bicarbonate 12/27/2014 50.0* 21.0 - 28.0 mEq/L Final  . Acid-Base Excess 12/27/2014 21.5* 0.0 - 3.0 mmol/L Final  . O2 Saturation 12/27/2014 91.4   Final  . Patient temperature 12/27/2014 37.0   Final  . Collection site 12/27/2014 RIGHT RADIAL   Final  . Sample type 12/27/2014 ARTERIAL DRAW    Final  . Allens test (pass/fail) 12/27/2014 POSITIVE* PASS Final  . Prothrombin Time 12/27/2014 13.5  11.4 - 15.0 seconds Final  . INR 12/27/2014 1.01   Final  . aPTT 12/27/2014 30  24 - 36 seconds Final  . Sodium 12/28/2014 138  135 - 145 mmol/L Final  . Potassium 12/28/2014 3.7  3.5 - 5.1 mmol/L Final  . Chloride 12/28/2014 91* 101 - 111 mmol/L Final  . CO2 12/28/2014 36* 22 - 32 mmol/L Final  . Glucose, Bld 12/28/2014 443* 65 - 99 mg/dL Final  . BUN 12/28/2014 35* 6 - 20 mg/dL Final  . Creatinine, Ser 12/28/2014 2.12* 0.44 - 1.00 mg/dL Final  . Calcium 12/28/2014 8.3* 8.9 - 10.3 mg/dL Final  . GFR calc non Af Amer 12/28/2014 23* >60 mL/min Final  . GFR calc Af Amer 12/28/2014 27* >60 mL/min Final   Comment: (NOTE) The eGFR has been calculated using the CKD EPI equation. This calculation has not  been validated in all clinical situations. eGFR's persistently <60 mL/min signify possible Chronic Kidney Disease.   . Anion gap 12/28/2014 11  5 - 15 Final  . WBC 12/28/2014 10.5  3.6 - 11.0 K/uL Final  . RBC 12/28/2014 3.81  3.80 - 5.20 MIL/uL Final  . Hemoglobin 12/28/2014 8.1* 12.0 - 16.0 g/dL Final   RESULT REPEATED AND VERIFIED  . HCT 12/28/2014 27.8* 35.0 - 47.0 % Final   RESULT REPEATED AND VERIFIED  . MCV 12/28/2014 73.2* 80.0 - 100.0 fL Final  . MCH 12/28/2014 21.3* 26.0 - 34.0 pg Final  . MCHC 12/28/2014 29.1* 32.0 - 36.0 g/dL Final  . RDW 12/28/2014 17.6* 11.5 - 14.5 % Final  . Platelets 12/28/2014 238  150 - 440 K/uL Final  . Magnesium 12/27/2014 2.0  1.7 - 2.4 mg/dL Final  . Troponin I 12/27/2014 0.07* <0.031 ng/mL Final   Comment: READ BACK AND VERIFIED TO RACHEL VERDI 12/27/2014 1539 [place]        PERSISTENTLY INCREASED TROPONIN VALUES IN THE RANGE OF 0.04-0.49 ng/mL CAN BE SEEN IN:       -UNSTABLE ANGINA       -CONGESTIVE HEART FAILURE       -MYOCARDITIS       -CHEST TRAUMA       -ARRYHTHMIAS       -LATE PRESENTING MYOCARDIAL INFARCTION       -COPD   CLINICAL  FOLLOW-UP RECOMMENDED.   [name] Troponin I 12/28/2014 0.07* <0.031 ng/mL Final   Comment: RESULTS PREVIOUSLY CALLED 12/27/14 _0  BY [facility].Marland KitchenMarland KitchenAJO        PERSISTENTLY INCREASED TROPONIN VALUES IN THE RANGE OF 0.04-0.49 ng/mL CAN BE SEEN IN:       -UNSTABLE ANGINA       -CONGESTIVE HEART FAILURE       -MYOCARDITIS       -CHEST TRAUMA       -ARRYHTHMIAS       -LATE PRESENTING MYOCARDIAL INFARCTION       -COPD   CLINICAL FOLLOW-UP RECOMMENDED.   [name] Troponin I 12/28/2014 0.06* <0.031 ng/mL Final   Comment: RESULTS PREVIOUSLY CALLED AT 1539 8.17.16 MPG        PERSISTENTLY INCREASED TROPONIN VALUES IN THE RANGE OF 0.04-0.49 ng/mL CAN BE SEEN IN:       -UNSTABLE ANGINA       -CONGESTIVE HEART FAILURE       -MYOCARDITIS       -CHEST TRAUMA       -ARRYHTHMIAS       -LATE PRESENTING MYOCARDIAL INFARCTION       -COPD   CLINICAL FOLLOW-UP RECOMMENDED.   [name] Glucose-Capillary 12/27/2014 415* 65 - 99 mg/dL Final  . Heparin Unfractionated 12/28/2014 0.61  0.30 - 0.70 IU/mL Final   Comment:        IF HEPARIN RESULTS ARE BELOW EXPECTED VALUES, AND PATIENT DOSAGE HAS BEEN CONFIRMED, SUGGEST FOLLOW UP TESTING OF ANTITHROMBIN III LEVELS.   [name] Heparin Unfractionated 12/28/2014 0.41  0.30 - 0.70 IU/mL Final   Comment:        IF HEPARIN RESULTS ARE BELOW EXPECTED VALUES, AND PATIENT DOSAGE HAS BEEN CONFIRMED, SUGGEST FOLLOW UP TESTING OF ANTITHROMBIN III LEVELS.   . Glucose-Capillary 12/28/2014 416* 65 - 99 mg/dL Final  . Glucose-Capillary 12/28/2014 398* 65 - 99 mg/dL Final  . Hgb A1c MFr Bld 12/28/2014 6.8* 4.0 - 6.0 % Final  . Glucose-Capillary 12/28/2014 361* 65 - 99 mg/dL Final  . Glucose-Capillary 12/28/2014 265* 65 -  99 mg/dL Final     STUDIES: Ct Chest Wo Contrast  12/28/2014   CLINICAL DATA:  Worsening right lung mass. Shortness of breath likely due to COPD exacerbation. Symptoms since 12/27/2014.  EXAM: CT CHEST WITHOUT CONTRAST  TECHNIQUE: Multidetector CT imaging of the chest was  performed following the standard protocol without IV contrast.  COMPARISON:  Plain film of the chest 12/27/2014 and CT chest 03/16/2014.  FINDINGS: A subcarinal lymph node to the right on image 26 measures 1.1 cm short axis dimension. No axillary lymphadenopathy is identified. No pleural or pericardial effusion. Cardiomegaly is noted. Mild aortic atherosclerosis is identified.  The lungs demonstrate changes of centrilobular emphysema. Mass in the right lower lobe which had measured 4.2 x 3.1 cm has increased in size to 5.7 x 4.6 cm on image 36. The mass abuts the pleural surface of the lateral chest and right hemidiaphragm. A few small calcifications are identified in the mass, new since the prior exam. Mild basilar atelectasis is noted. Scarring in the periphery of the lingula is unchanged.  Incidentally imaged upper abdomen including the adrenal glands is unremarkable.  IMPRESSION: Interval increase in the size of a right lower lobe pulmonary mass in this patient with emphysema is most worrisome for bronchogenic carcinoma. 1.1 cm subcarinal lymph node may be incidental but could be mildly enlarged due to metastatic disease.  Cardiomegaly without edema.   Electronically Signed   By: Inge Rise M.D.   On: 12/28/2014 14:51   US Venous Img Lower Bilateral  12/28/2014   CLINICAL DATA:  Shortness of Breath  EXAM: BILATERAL LOWER EXTREMITY VENOUS DOPPLER ULTRASOUND  TECHNIQUE: Gray-scale sonography with compression, as well as color and duplex ultrasound, were performed to evaluate the deep venous system from the level of the common femoral vein through the popliteal and proximal calf veins.  COMPARISON:  None  FINDINGS: Normal compressibility of the common femoral, superficial femoral, and popliteal veins, as well as the proximal calf veins. No filling defects to suggest DVT on grayscale or color Doppler imaging. Doppler waveforms show normal direction of venous flow, normal respiratory phasicity and response  to augmentation.  IMPRESSION: 1. No evidence of lower extremity deep vein thrombosis, BILATERALLY.   Electronically Signed   By: Lucrezia Europe M.D.   On: 12/28/2014 15:39   Dg Chest Portable 1 View  12/27/2014   CLINICAL DATA:  Increase shortness of breath, hypoxemia beginning last night.  EXAM: PORTABLE CHEST - 1 VIEW  COMPARISON:  10/10/2013  FINDINGS: There is cardiomegaly. Continued right basilar opacity, enlarged since prior study. Cannot exclude enlarging mass/cancer in the right lung base as described on prior CT. No overt edema. No visible effusions. No acute bony abnormality.  IMPRESSION: Worsening masslike opacity at the right lung base. Cannot exclude lung cancer as described on prior chest CT.  Cardiomegaly.   Electronically Signed   By: Rolm Baptise M.D.   On: 12/27/2014 15:28    ASSESSMENT:  Right lower lobe lung mass in comparison to the previous CT scan there is increase in the size.  There is subcarinal lymph node clinical picture is quite consistent with carcinoma of lung Obesity with BMI of 37 or more Mild renal insufficiency Documented iron deficiency anemia in the past but patient has refused further workup with endoscopy  PLAN:   All lab data has been reviewed all the x-rays were reviewed independently. I reviewed Dr. Gust Brooms note I had prolonged discussion with patient.  See absolutely refuses to do any  further evaluation with PET scan and a needle biopsy and further refuses any treatment.  See has done that in the past and documented in Dr. Arlys John note of January of 2016.  Patient is fully aware that this could be cancer. I do not see any need to do any further PET scanning unless patient is willing to undergo biopsy and  consider treatment option. I would make appointment for her to be seen in cancer clinic as outpatient in next few weeks and further explained nature of disease. Other medical illnesses slight possibility of sleep apnea.  Management of obesity.  COPD.   Can be considered as outpatient  Patient expressed understanding and was in agreement with this plan. She also understands that She can call clinic at any time with any questions, concerns, or complaints.    No matching staging information was found for the patient.  Forest Gleason, MD   12/28/2014 7:54 PM

## 2014-12-28 NOTE — Consult Note (Signed)
ANTICOAGULATION CONSULT NOTE - Initial Consult  Pharmacy Consult for heparin Indication: pulmonary embolus (suspected)  Allergies  Allergen Reactions  . Nickel Other (See Comments)  . Simvastatin Other (See Comments)    Patient Measurements: Height:  (162.6 cm) Weight: 217 lb 8 oz (98.657 kg) IBW/kg (Calculated) : 54.7 Heparin Dosing Weight: 81.2  Vital Signs: Temp: 98.2 F (36.8 C) (08/17 0459) Temp Source: Oral (08/17 0459) BP: 131/61 mmHg (08/17 0459) Pulse Rate: 104 (08/17 0459)  Labs:  Recent Labs  12/27/14 1454 12/27/14 1717 12/27/14 1805 12/28/14 0108 12/28/14 0109 12/28/14 0503 12/28/14 0915  HGB 9.8*  --   --   --  8.1*  --   --   HCT 33.5*  --   --   --  27.8*  --   --   PLT 312  --   --   --  238  --   --   APTT  --  30  --   --   --   --   --   LABPROT  --  13.5  --   --   --   --   --   INR  --  1.01  --   --   --   --   --   HEPARINUNFRC  --   --   --  0.61  --   --  0.41  CREATININE 1.73*  --   --   --  2.12*  --   --   TROPONINI 0.08*  --  0.07* 0.07*  --  0.06*  --     Estimated Creatinine Clearance: 29.4 mL/min (by C-G formula based on Cr of 2.12).   Medical History: Past Medical History  Diagnosis Date  . COPD (chronic obstructive pulmonary disease)   . Chronic respiratory failure   . Diabetes mellitus without complication   . Chronic diastolic heart failure   . Hypertension   . 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 Heparin level for 0200. Will  follow up.  9811 0245 Heparin level 0.61 - continue current. Will check another anti-xa level in 6 hours then switch to daily monitoring.   8/17: Heparin level @ 09:00 resulted @ 0.41. Will continue current rate. Will order next heparin level with am labs.  Demetrius Charity, PharmD Clinical Pharmacist 12/28/2014,9:41 AM

## 2014-12-28 NOTE — Progress Notes (Addendum)
Inpatient Diabetes Program Recommendations  AACE/ADA: New Consensus Statement on Inpatient Glycemic Control (2013)  Target Ranges:  Prepandial:   less than 140 mg/dL      Peak postprandial:   less than 180 mg/dL (1-2 hours)      Critically ill patients:  140 - 180 mg/dL   Review of Glycemic control:  Results for TWANDA, STAKES (MRN [id number]) as of 12/28/2014 13:35  Ref. Range 12/27/2014 22:26 12/28/2014 07:20 12/28/2014 08:39 12/28/2014 11:35  Glucose-Capillary Latest Ref Range: 65-99 mg/dL 578 (H) 469 (H) 629 (H) 361 (H)   Results for KENSINGTON, RIOS (MRN [id number]) as of 12/28/2014 13:35  Ref. Range 10/11/2013 03:31  Hemoglobin A1C Latest Ref Range: 4.2-6.3 % 8.4 (H)   Diabetes history: Type 2 diabetes Outpatient Diabetes medications: Glucotrol 10 mg bid Current orders for Inpatient glycemic control:  Lantus 15 units q HS, Novolog 5 units tid with meals, Novolog sensitive tid with meals  Blood sugars increased with IV steroids. Note that Lantus and Novolog meal coverage started today. May consider also increasing Novolog correction to moderate and increase frequency to q 4 hours.  Will follow while in the hospital.    Thanks, Beryl Meager, RN, BC-ADM Inpatient Diabetes Coordinator Pager [phone] (8a-5p)

## 2014-12-28 NOTE — Consult Note (Signed)
She will was reevaluated today.  Was lying in the bed and extremely lethargic. Combination revealed patient lying in the bed lungs diminished air entry rhonchi patient is moderately obese tachycardia Abdomen difficult to palpate. Lower extremity trace edema Neurological the system difficulty evaluated Again all lab data has been reviewed. Again discuss findings suggestive of malignancy in the lung patient still does not want any further evaluation or treatment. Possible palliative care approach the patient continues to refuse intervention Vital signs have been reviewed All other notes have been reviewed

## 2014-12-28 NOTE — Progress Notes (Signed)
[facility] - [place] at [facility]   PATIENT NAME: Rhonda Garner    MR#:  [id number]  DATE OF BIRTH:  08-Jul-1945  SUBJECTIVE:  CHIEF COMPLAINT:   Chief Complaint  Patient presents with  . Respiratory Distress   Patient here due to shortness of breath suspected to be due to COPD exacerbation.  Shortness of breath improved. Denies hemoptysis, cough, fever. No chest pain  REVIEW OF SYSTEMS:    Review of Systems  Constitutional: Negative for fever and chills.  HENT: Negative for congestion and tinnitus.   Eyes: Negative for blurred vision and double vision.  Respiratory: Positive for shortness of breath. Negative for cough, hemoptysis and wheezing.   Cardiovascular: Negative for chest pain, orthopnea and PND.  Gastrointestinal: Negative for nausea, vomiting, abdominal pain and diarrhea.  Genitourinary: Negative for dysuria and hematuria.  Neurological: Positive for weakness (generalized). Negative for dizziness, sensory change and focal weakness.  All other systems reviewed and are negative.   Nutrition: Heart healthy carb control Tolerating Diet: Yes Tolerating PT: Ambulatory   DRUG ALLERGIES:   Allergies  Allergen Reactions  . Nickel Other (See Comments)  . Simvastatin Other (See Comments)    VITALS:  Blood pressure 131/61, pulse 104, temperature 98.2 F (36.8 C), temperature source Oral, resp. rate 20, height 5\' 4"  (1.626 m), weight 98.657 kg (217 lb 8 oz), SpO2 94 %.  PHYSICAL EXAMINATION:   Physical Exam  GENERAL:  69 y.o.-year-old obese patient lying in the bed in mild respiratory distress EYES: Pupils equal, round, reactive to light and accommodation. No scleral icterus. Extraocular muscles intact.  HEENT: Head atraumatic, normocephalic. Oropharynx and nasopharynx clear.  NECK:  Supple, no jugular venous distention. No thyroid enlargement, no tenderness.  LUNGS: Poor respiratory effort. Prolonged inspiratory and expiratory phase.  Minimal end expiratory wheezing, no rales, rhonchi. No use of accessory muscles of respiration.  CARDIOVASCULAR: S1, S2 normal. No murmurs, rubs, or gallops.  ABDOMEN: Soft, nontender, nondistended. Bowel sounds present. No organomegaly or mass.  EXTREMITIES: No cyanosis, clubbing or edema b/l.    NEUROLOGIC: Cranial nerves II through XII are intact. No focal Motor or sensory deficits b/l.  Globally weak PSYCHIATRIC: The patient is alert and oriented x 3. Good affect SKIN: No obvious rash, lesion, or ulcer.    LABORATORY PANEL:   CBC  Recent Labs Lab 12/28/14 0109  WBC 10.5  HGB 8.1*  HCT 27.8*  PLT 238   ------------------------------------------------------------------------------------------------------------------  Chemistries   Recent Labs Lab 12/27/14 1454 12/27/14 1805 12/28/14 0109  NA 147*  --  138  K 3.3*  --  3.7  CL 92*  --  91*  CO2 45*  --  36*  GLUCOSE 150*  --  443*  BUN 22*  --  35*  CREATININE 1.73*  --  2.12*  CALCIUM 9.2  --  8.3*  MG  --  2.0  --   AST 29  --   --   ALT 22  --   --   ALKPHOS 96  --   --   BILITOT 0.5  --   --    ------------------------------------------------------------------------------------------------------------------  Cardiac Enzymes  Recent Labs Lab 12/28/14 0503  TROPONINI 0.06*   ------------------------------------------------------------------------------------------------------------------  RADIOLOGY:  Dg Chest Portable 1 View  12/27/2014   CLINICAL DATA:  Increase shortness of breath, hypoxemia beginning last night.  EXAM: PORTABLE CHEST - 1 VIEW  COMPARISON:  10/10/2013  FINDINGS: There is cardiomegaly. Continued right basilar opacity, enlarged since prior study.  Cannot exclude enlarging mass/cancer in the right lung base as described on prior CT. No overt edema. No visible effusions. No acute bony abnormality.  IMPRESSION: Worsening masslike opacity at the right lung base. Cannot exclude lung cancer as  described on prior chest CT.  Cardiomegaly.   Electronically Signed   By: Charlett Nose M.D.   On: 12/27/2014 15:28     ASSESSMENT AND PLAN:   69 year old female with past medical history of COPD, diabetes, hypertension, hyperlipidemia, chronic anemia, chronic kidney disease stage III, who presented to the hospital due to shortness of breath and noted to be in COPD exacerbation.   #1 COPD exacerbation-clinically improving. Continue IV steroids but will taper. -Continue duo nebs around-the-clock, I will add Dulera, Spiriva. Assessment home oxygen prior to discharge. -Chest x-ray showed masslike opacity in the right lung base and we'll get CT chest today.  #2 lung mass-incidentally noted on chest x-ray on admission. -We'll get CT noncontrast in follow-up. If needed consider pulmonary consult. Patient has no acute hemoptysis.  #3 suspected PE-this was suspected on admission and patient is cardiac in heparin nomogram. -We'll get Dopplers of lower extremities and if they're negative we will DC heparin drip. My clinical suspicion for pulmonary embolism is low. If needed will consider VQ scan as we cannot do CT chest given her renal failure..  #4 type 2 diabetes-blood sugars uncontrolled due to IV steroids. -I will check a hemoglobin A1c. We'll start patient on Lantus, NovoLog with meals. - will get diabetic lifestyle assessment.   #5 hypertension-continue Norvasc.  #6 hyperlipidemia-continue Pravachol.  #7 chronic kidney disease stage III-creatinine close to baseline and will follow.  #8 elevated troponin-likely in the setting of demand ischemia from hypoxia and COPD exacerbation. No evidence of acute coronary syndrome. We'll DC telemetry.   All the records are reviewed and case discussed with Care Management/Social Workerr. Management plans discussed with the patient, family and they are in agreement.  CODE STATUS: Full  DVT Prophylaxis: Heparin normogram  TOTAL TIME TAKING CARE OF  THIS PATIENT: 40 minutes.   POSSIBLE D/C IN 1-2 DAYS, DEPENDING ON CLINICAL CONDITION.   Houston Siren M.D on 12/28/2014 at 11:26 AM  Between 7am to 6pm - Pager - [phone]  After 6pm go to [email or web address] - password [email or web address] [facility]  [facility]  Office  [phone]  CC: Primary care physician; Sherrie Mustache, MD

## 2014-12-29 DIAGNOSIS — R609 Edema, unspecified: Secondary | ICD-10-CM

## 2014-12-29 DIAGNOSIS — R000 Tachycardia, unspecified: Secondary | ICD-10-CM

## 2014-12-29 LAB — GLUCOSE, CAPILLARY
GLUCOSE-CAPILLARY: 317 mg/dL — AB (ref 65–99)
GLUCOSE-CAPILLARY: 419 mg/dL — AB (ref 65–99)
Glucose-Capillary: 273 mg/dL — ABNORMAL HIGH (ref 65–99)
Glucose-Capillary: 410 mg/dL — ABNORMAL HIGH (ref 65–99)
Glucose-Capillary: 424 mg/dL — ABNORMAL HIGH (ref 65–99)
Glucose-Capillary: 352 mg/dL — ABNORMAL HIGH (ref 65–99)

## 2014-12-29 LAB — BLOOD GAS, ARTERIAL
Acid-Base Excess: 11.2 mmol/L — ABNORMAL HIGH (ref 0.0–3.0)
Allens test (pass/fail): POSITIVE — AB
BICARBONATE: 41.3 meq/L — AB (ref 21.0–28.0)
FIO2: 0.4
O2 Saturation: 93.1 %
PH ART: 7.3 — AB (ref 7.350–7.450)
PO2 ART: 74 mmHg — AB (ref 83.0–108.0)
Patient temperature: 37
pCO2 arterial: 84 mmHg (ref 32.0–48.0)

## 2014-12-29 LAB — CBC
HCT: 27 % — ABNORMAL LOW (ref 35.0–47.0)
HEMOGLOBIN: 8 g/dL — AB (ref 12.0–16.0)
MCH: 21.7 pg — AB (ref 26.0–34.0)
MCHC: 29.7 g/dL — AB (ref 32.0–36.0)
MCV: 73 fL — AB (ref 80.0–100.0)
PLATELETS: 249 10*3/uL (ref 150–440)
RBC: 3.69 MIL/uL — ABNORMAL LOW (ref 3.80–5.20)
RDW: 17.5 % — ABNORMAL HIGH (ref 11.5–14.5)
WBC: 27.9 10*3/uL — ABNORMAL HIGH (ref 3.6–11.0)

## 2014-12-29 LAB — BASIC METABOLIC PANEL
Anion gap: 7 (ref 5–15)
BUN: 49 mg/dL — AB (ref 6–20)
CHLORIDE: 93 mmol/L — AB (ref 101–111)
CO2: 37 mmol/L — ABNORMAL HIGH (ref 22–32)
CREATININE: 2.05 mg/dL — AB (ref 0.44–1.00)
Calcium: 8.4 mg/dL — ABNORMAL LOW (ref 8.9–10.3)
GFR calc Af Amer: 28 mL/min — ABNORMAL LOW (ref >60)
GFR calc non Af Amer: 24 mL/min — ABNORMAL LOW (ref >60)
GLUCOSE: 316 mg/dL — AB (ref 65–99)
POTASSIUM: 4.3 mmol/L (ref 3.5–5.1)
SODIUM: 137 mmol/L (ref 135–145)

## 2014-12-29 MED ORDER — INSULIN GLARGINE 100 UNIT/ML ~~LOC~~ SOLN
20.0000 [IU] | Freq: Every day | SUBCUTANEOUS | Status: DC
  Administered 2014-12-29: 20 [IU] via SUBCUTANEOUS
  Filled 2014-12-29 (×2): qty 0.2

## 2014-12-29 MED ORDER — INSULIN ASPART 100 UNIT/ML ~~LOC~~ SOLN
0.0000 [IU] | Freq: Every day | SUBCUTANEOUS | Status: DC
  Administered 2014-12-29 – 2014-12-31 (×3): 5 [IU] via SUBCUTANEOUS
  Administered 2015-01-01: 4 [IU] via SUBCUTANEOUS
  Filled 2014-12-29 (×2): qty 5
  Filled 2014-12-29: qty 4
  Filled 2014-12-29: qty 5

## 2014-12-29 MED ORDER — INSULIN ASPART 100 UNIT/ML ~~LOC~~ SOLN
0.0000 [IU] | Freq: Three times a day (TID) | SUBCUTANEOUS | Status: DC
  Administered 2014-12-29 – 2014-12-30 (×3): 15 [IU] via SUBCUTANEOUS
  Administered 2014-12-30: 11 [IU] via SUBCUTANEOUS
  Administered 2014-12-31: 5 [IU] via SUBCUTANEOUS
  Administered 2014-12-31 (×2): 8 [IU] via SUBCUTANEOUS
  Administered 2015-01-01: 18:00:00 5 [IU] via SUBCUTANEOUS
  Administered 2015-01-01: 2 [IU] via SUBCUTANEOUS
  Administered 2015-01-01: 5 [IU] via SUBCUTANEOUS
  Administered 2015-01-02: 3 [IU] via SUBCUTANEOUS
  Administered 2015-01-02: 09:00:00 5 [IU] via SUBCUTANEOUS
  Administered 2015-01-02 – 2015-01-03 (×3): 3 [IU] via SUBCUTANEOUS
  Filled 2014-12-29: qty 15
  Filled 2014-12-29: qty 8
  Filled 2014-12-29: qty 2
  Filled 2014-12-29: qty 3
  Filled 2014-12-29: qty 15
  Filled 2014-12-29: qty 3
  Filled 2014-12-29 (×2): qty 5
  Filled 2014-12-29: qty 3
  Filled 2014-12-29: qty 5
  Filled 2014-12-29: qty 15
  Filled 2014-12-29: qty 5
  Filled 2014-12-29 (×2): qty 15
  Filled 2014-12-29: qty 8
  Filled 2014-12-29: qty 3

## 2014-12-29 MED ORDER — INSULIN ASPART 100 UNIT/ML ~~LOC~~ SOLN
15.0000 [IU] | Freq: Once | SUBCUTANEOUS | Status: AC
  Administered 2014-12-29: 19:00:00 15 [IU] via SUBCUTANEOUS

## 2014-12-29 NOTE — Progress Notes (Signed)
[facility] - [place] at [facility]   PATIENT NAME: Rhonda Garner    MR#:  [id number]  DATE OF BIRTH:  August 11, 1945  SUBJECTIVE:  CHIEF COMPLAINT:   Chief Complaint  Patient presents with  . Respiratory Distress   Patient here due to shortness of breath suspected to be due to COPD exacerbation and noted to have a Lung Mass. CT yesterday confirmed Lung mass which had grown in size.  No hemoptysis.    REVIEW OF SYSTEMS:    Review of Systems  Constitutional: Negative for fever and chills.  HENT: Negative for congestion and tinnitus.   Eyes: Negative for blurred vision and double vision.  Respiratory: Positive for cough, shortness of breath and wheezing (end-exp. wheezing. ). Negative for hemoptysis and sputum production.   Cardiovascular: Negative for chest pain, orthopnea and PND.  Gastrointestinal: Negative for nausea, vomiting, abdominal pain and diarrhea.  Genitourinary: Negative for dysuria and hematuria.  Neurological: Positive for weakness (generalized). Negative for dizziness, sensory change and focal weakness.  All other systems reviewed and are negative.   Nutrition: Heart healthy carb control Tolerating Diet: Yes Tolerating PT: Ambulatory   DRUG ALLERGIES:   Allergies  Allergen Reactions  . Nickel Other (See Comments)  . Simvastatin Other (See Comments)    VITALS:  Blood pressure 119/54, pulse 93, temperature 97.8 F (36.6 C), temperature source Oral, resp. rate 20, height 5\' 4"  (1.626 m), weight 98.657 kg (217 lb 8 oz), SpO2 95 %.  PHYSICAL EXAMINATION:   Physical Exam  GENERAL:  69 y.o.-year-old obese patient lying in the bed in mild respiratory distress EYES: Pupils equal, round, reactive to light and accommodation. No scleral icterus. Extraocular muscles intact.  HEENT: Head atraumatic, normocephalic. Oropharynx and nasopharynx clear.  NECK:  Supple, no jugular venous distention. No thyroid enlargement, no tenderness.  LUNGS:  end-exp. Wheezing, No rales, rhonchi.  (-) use of accessory muscles.  CARDIOVASCULAR: S1, S2 normal. No murmurs, rubs, or gallops.  ABDOMEN: Soft, nontender, nondistended. Bowel sounds present. No organomegaly or mass.  EXTREMITIES: No cyanosis, clubbing or edema b/l.    NEUROLOGIC: Cranial nerves II through XII are intact. No focal Motor or sensory deficits b/l.  PSYCHIATRIC: The patient is alert and oriented x 3. Good affect SKIN: No obvious rash, lesion, or ulcer.    LABORATORY PANEL:   CBC  Recent Labs Lab 12/29/14 0524  WBC 27.9*  HGB 8.0*  HCT 27.0*  PLT 249   ------------------------------------------------------------------------------------------------------------------  Chemistries   Recent Labs Lab 12/27/14 1454 12/27/14 1805  12/29/14 0524  NA 147*  --   < > 137  K 3.3*  --   < > 4.3  CL 92*  --   < > 93*  CO2 45*  --   < > 37*  GLUCOSE 150*  --   < > 316*  BUN 22*  --   < > 49*  CREATININE 1.73*  --   < > 2.05*  CALCIUM 9.2  --   < > 8.4*  MG  --  2.0  --   --   AST 29  --   --   --   ALT 22  --   --   --   ALKPHOS 96  --   --   --   BILITOT 0.5  --   --   --   < > = values in this interval not displayed. ------------------------------------------------------------------------------------------------------------------  Cardiac Enzymes  Recent Labs Lab 12/28/14 0503  TROPONINI  0.06*   ------------------------------------------------------------------------------------------------------------------  RADIOLOGY:  Ct Chest Wo Contrast  12/28/2014   CLINICAL DATA:  Worsening right lung mass. Shortness of breath likely due to COPD exacerbation. Symptoms since 12/27/2014.  EXAM: CT CHEST WITHOUT CONTRAST  TECHNIQUE: Multidetector CT imaging of the chest was performed following the standard protocol without IV contrast.  COMPARISON:  Plain film of the chest 12/27/2014 and CT chest 03/16/2014.  FINDINGS: A subcarinal lymph node to the right on image 26  measures 1.1 cm short axis dimension. No axillary lymphadenopathy is identified. No pleural or pericardial effusion. Cardiomegaly is noted. Mild aortic atherosclerosis is identified.  The lungs demonstrate changes of centrilobular emphysema. Mass in the right lower lobe which had measured 4.2 x 3.1 cm has increased in size to 5.7 x 4.6 cm on image 36. The mass abuts the pleural surface of the lateral chest and right hemidiaphragm. A few small calcifications are identified in the mass, new since the prior exam. Mild basilar atelectasis is noted. Scarring in the periphery of the lingula is unchanged.  Incidentally imaged upper abdomen including the adrenal glands is unremarkable.  IMPRESSION: Interval increase in the size of a right lower lobe pulmonary mass in this patient with emphysema is most worrisome for bronchogenic carcinoma. 1.1 cm subcarinal lymph node may be incidental but could be mildly enlarged due to metastatic disease.  Cardiomegaly without edema.   Electronically Signed   By: Drusilla Kanner M.D.   On: 12/28/2014 14:51   US Venous Img Lower Bilateral  12/28/2014   CLINICAL DATA:  Shortness of Breath  EXAM: BILATERAL LOWER EXTREMITY VENOUS DOPPLER ULTRASOUND  TECHNIQUE: Gray-scale sonography with compression, as well as color and duplex ultrasound, were performed to evaluate the deep venous system from the level of the common femoral vein through the popliteal and proximal calf veins.  COMPARISON:  None  FINDINGS: Normal compressibility of the common femoral, superficial femoral, and popliteal veins, as well as the proximal calf veins. No filling defects to suggest DVT on grayscale or color Doppler imaging. Doppler waveforms show normal direction of venous flow, normal respiratory phasicity and response to augmentation.  IMPRESSION: 1. No evidence of lower extremity deep vein thrombosis, BILATERALLY.   Electronically Signed   By: Corlis Leak M.D.   On: 12/28/2014 15:39   Dg Chest Portable 1  View  12/27/2014   CLINICAL DATA:  Increase shortness of breath, hypoxemia beginning last night.  EXAM: PORTABLE CHEST - 1 VIEW  COMPARISON:  10/10/2013  FINDINGS: There is cardiomegaly. Continued right basilar opacity, enlarged since prior study. Cannot exclude enlarging mass/cancer in the right lung base as described on prior CT. No overt edema. No visible effusions. No acute bony abnormality.  IMPRESSION: Worsening masslike opacity at the right lung base. Cannot exclude lung cancer as described on prior chest CT.  Cardiomegaly.   Electronically Signed   By: Charlett Nose M.D.   On: 12/27/2014 15:28     ASSESSMENT AND PLAN:   69 year old female with past medical history of COPD, diabetes, hypertension, hyperlipidemia, chronic anemia, chronic kidney disease stage III, who presented to the hospital due to shortness of breath and noted to be in COPD exacerbation.  #1 COPD exacerbation-clinically improving. Continue IV steroids and slow to improve.  -Continue duo nebs around-the-clock, Dulera, Spiriva. Pt. Already on O2 at home -CT chest yesterday showing increase in the size of lung mass. No evidence of overt pneumonia and hold off on antibiotics presently.  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  - will place on Bipap today and follow clinically.  - cont. Treatment of COPD as mentioned above.   #3 lung mass-incidentally noted on chest x-ray on admission. -Has CT chest yesterday which showed increase in size of the lung mass. Appreciate pulmonary/oncology input. Patient presently does not want any workup or intervention. The next step would be a biopsy likely transthoracic and also a PET scan for staging.  #4 suspected PE-ruled out. Dopplers of lower extremities negative for DVT yesterday. Off HEPARIN drip.  #5 type 2 diabetes-blood sugars still uncontrolled due to IV steroids. -HemoglobinA1c is 6.8.  Cont. Lantus and will advance SSI as per  Diabetic lifestyle assessment.  Cont. Novolog with meals.   #6 hypertension-continue Norvasc.  #7 hyperlipidemia-continue Pravachol.  #8 chronic kidney disease stage III-creatinine close to baseline and will follow.  #89elevated troponin-likely in the setting of demand ischemia from hypoxia and COPD exacerbation. No evidence of acute coronary syndrome or chest pain.   All the records are reviewed and case discussed with Care Management/Social Workerr. Management plans discussed with the patient, family and they are in agreement.  CODE STATUS: Full  DVT Prophylaxis: Heparin normogram  TOTAL CRITICAL CARE TIME TAKING CARE OF THIS PATIENT: 35 minutes.   POSSIBLE D/C IN 1-2 DAYS, DEPENDING ON CLINICAL CONDITION.   Houston Siren M.D on 12/29/2014 at 1:13 PM  Between 7am to 6pm - Pager - [phone]  After 6pm go to [email or web address] - password [email or web address] [facility]  [facility]  Office  [phone]  CC: Primary care physician; Sherrie Mustache, MD

## 2014-12-29 NOTE — Care Management Important Message (Signed)
Important Message  Patient Details  Name: Rhonda Garner MRN: [id number] Date of Birth: November 16, 1945   Medicare Important Message Given:  Yes-second notification given    Olegario Messier A Allmond 12/29/2014, 10:09 AM

## 2014-12-29 NOTE — Plan of Care (Signed)
Called Dr Cherlynn Kaiser for FBS of 410.  He said to give coverage now.  Will recheck.  Also increasing lantus to 20 u.

## 2014-12-29 NOTE — Progress Notes (Signed)
RT took Bipap to patient's room, patient now wide awake and has no memory of being lethargic, even after RT informed patient that I had collected ABG from her less than an hour ago.  She is questioning why the Dr. Stann Mainland not let her go home today.  Corrie Dandy, RN made aware of change in mental status.  Bipap left in room and will use as needed.

## 2014-12-29 NOTE — Plan of Care (Signed)
Problem: Discharge Progression Outcomes Goal: Dyspnea controlled Outcome: Not Progressing Pt was very lethargic today.  Had stat ABG - pCO2 was 84; had been 72 on 8/16.  We were going to put her on bipap but when resp therapist brought in machine - pt was wide awake and eating lunch.  Pt was supposed to have PET scan today but blood sugars were too high and she hadn't been NPO.  Diabetic educator saw her today and she was advanced to moderate correction scale 0-15; Educator though basal rate also needed to be increased. FBS increase possibly due to IV steroids.  IVFs stopped.  PT worked w/her and their recommendation is rehab.  Pt wants to go home to her puppy.  Pt was steady w/1asst to BR.

## 2014-12-29 NOTE — Evaluation (Signed)
Physical Therapy Evaluation Patient Details Name: Rhonda Garner MRN: [id number] DOB: [birth year] Today's Date: 12/29/2014   History of Present Illness  presented to ER secondary to SOB, sats in 60-70s on RA; admitted with acute respiratory distress secondary to COPD exacerbation.  Initial suspicion for PE with patient initially started on heparin drip (unable to tolerate CTA secondary to kidney function); now discontinued, as clinical suspicion for PE very low per Dr. Cherlynn Kaiser  Clinical Impression  Upon evaluation, patient alert and oriented to basic information; generally confused/agitated at times to more complex information. Bilat shoulder elevation limited (all planes, R > L), but baseline for patient.  LE strength and ROM grossly WFL and symmetrical.  Able to complete bed mobility indep; sit/stand, basic transfers and short-distance gait (20') with RW, cga/close sup.  No significant LOB or safety concerns, but patient with marked desaturation with very minimal activity despite 5-6L supplemental O2 via .  Consistently desat to 80-81% with basic transfers or short-distance gait; returns to >91-92% with 60 seconds seated rest.  Lacks cardiopulmonary endurance required for daily household activities necessary for safe discharge home alone. Would benefit from skilled PT to address above deficits and promote optimal return to PLOF; recommend transition to STR upon discharge from acute hospitalization.  Patient notably frustrated with recommendation (poor insight into deficits); will continue to reassess as medical status improves.    Follow Up Recommendations SNF    Equipment Recommendations  Rolling walker with 5" wheels    Recommendations for Other Services       Precautions / Restrictions Precautions Precautions: Fall Restrictions Weight Bearing Restrictions: No      Mobility  Bed Mobility Overal bed mobility: Modified Independent                Transfers Overall transfer  level: Needs assistance Equipment used: Rolling walker (2 wheeled) Transfers: Sit to/from Stand Sit to Stand: Min guard            Ambulation/Gait Ambulation/Gait assistance: Min assist Ambulation Distance (Feet): 20 Feet Assistive device: Rolling walker (2 wheeled)       General Gait Details: broad BOS, excessive lateral sway (due to body habitus vs. pathology); fair cadence and overall speed.  Good stability with turn negotiation.  Significant desat with minimal activity (sats 80-81% on 6L); additional activity deferred as result.  Stairs            Wheelchair Mobility    Modified Rankin (Stroke Patients Only)       Balance Overall balance assessment: Needs assistance Sitting-balance support: No upper extremity supported;Feet supported Sitting balance-Leahy Scale: Good     Standing balance support: Bilateral upper extremity supported Standing balance-Leahy Scale: Fair                               Pertinent Vitals/Pain Pain Assessment: No/denies pain    Home Living Family/patient expects to be discharged to:: Private residence Living Arrangements: Alone   Type of Home: House Home Access: Stairs to enter   [employer] of Steps: 3 Home Layout: One level        Prior Function Level of Independence: Independent         Comments: Indep with household activities; does have aide 2-3 hours/day, 4 days/week (assist with ADLs and household chores)     Hand Dominance        Extremity/Trunk Assessment   Upper Extremity Assessment:  (R > L shoulder elevation limited  due to chronic weakness)           Lower Extremity Assessment: Overall WFL for tasks assessed         Communication   Communication: No difficulties  Cognition Arousal/Alertness: Awake/alert Behavior During Therapy: WFL for tasks assessed/performed;Agitated (slightly agitated at times (esp when presenting information patient not in agreement  with)) Overall Cognitive Status: Difficult to assess (oriented to basic information; appears slightly confused in general, but agitated wtih continued attempts at testing)                      General Comments      Exercises        Assessment/Plan    PT Assessment Patient needs continued PT services  PT Diagnosis Difficulty walking (Impaired cardiopulmonary endurnace)   PT Problem List Decreased strength;Decreased range of motion;Decreased activity tolerance;Decreased balance;Decreased mobility;Cardiopulmonary status limiting activity;Decreased knowledge of use of DME;Decreased safety awareness;Obesity  PT Treatment Interventions DME instruction;Gait training;Stair training;Functional mobility training;Therapeutic activities;Therapeutic exercise;Balance training;Patient/family education   PT Goals (Current goals can be found in the Care Plan section) Acute Rehab PT Goals Patient Stated Goal: "to go home" PT Goal Formulation: With patient Time For Goal Achievement: 01/12/15 Potential to Achieve Goals: Good    Frequency Min 2X/week   Barriers to discharge Decreased caregiver support;Inaccessible home environment      Co-evaluation               End of Session Equipment Utilized During Treatment: Gait belt Activity Tolerance:  (Treatment limited by activity tolerance and O2 response to activity) Patient left: in chair;with call bell/phone within reach;with chair alarm set Nurse Communication: Mobility status (O2 response to activity)         Time: 1610-9604 PT Time Calculation (min) (ACUTE ONLY): 23 min   Charges:   PT Evaluation $Initial PT Evaluation Tier I: 1 Procedure     PT G Codes:       Latandra Loureiro H. Manson Passey, PT, DPT, NCS 12/29/2014, 3:00 PM [phone]

## 2014-12-29 NOTE — Progress Notes (Signed)
Inpatient Diabetes Program Recommendations  AACE/ADA: New Consensus Statement on Inpatient Glycemic Control (2013)  Target Ranges:  Prepandial:   less than 140 mg/dL      Peak postprandial:   less than 180 mg/dL (1-2 hours)      Critically ill patients:  140 - 180 mg/dL   Results for NIMRA, PUCCINELLI (MRN [id number]) as of 12/29/2014 07:49  Ref. Range 12/28/2014 08:39 12/28/2014 11:35 12/28/2014 16:51 12/28/2014 21:30 12/29/2014 07:37  Glucose-Capillary Latest Ref Range: 65-99 mg/dL 409 (H) 811 (H) 914 (H) 265 (H) 273 (H)   Diabetes history: Type 2 diabetes Outpatient Diabetes medications: Glucotrol 10 mg bid Current orders for Inpatient glycemic control:  Lantus 15 units q HS, Novolog 5 units tid with meals, Novolog sensitive tid with meals  Post prandial blood sugars remain elevated- consider increasing Novolog correction to moderate 0-15 units. Will follow while in the hospital.   Susette Racer, RN, BA, [place], CDE Diabetes Coordinator Inpatient Diabetes Program  [phone] (Team Pager) [phone] [facility] Office) 12/29/2014 7:58 AM

## 2014-12-29 NOTE — Plan of Care (Signed)
Problem: Discharge Progression Outcomes Goal: Home O2 if indicated Outcome: Completed/Met Date Met:  12/29/14 Pt wears home O2.  Goal: Other Discharge Outcomes/Goals Outcome: Progressing Pt tolerating diet. No c/o pain. O2 at 6 liters. Up with assist to [facility]. Pt scheduled for PET scan this am. Oncology and Pulmonology consult complete. Dr Raul Del came up to see patient yesterday around shift change but had to come back per patient request because she did not quite understand the discussion that took place between them.

## 2014-12-30 DIAGNOSIS — I517 Cardiomegaly: Secondary | ICD-10-CM

## 2014-12-30 DIAGNOSIS — R599 Enlarged lymph nodes, unspecified: Secondary | ICD-10-CM

## 2014-12-30 DIAGNOSIS — J441 Chronic obstructive pulmonary disease with (acute) exacerbation: Secondary | ICD-10-CM

## 2014-12-30 LAB — BASIC METABOLIC PANEL
Anion gap: 9 (ref 5–15)
BUN: 76 mg/dL — AB (ref 6–20)
CALCIUM: 8.5 mg/dL — AB (ref 8.9–10.3)
CO2: 34 mmol/L — AB (ref 22–32)
CREATININE: 2.58 mg/dL — AB (ref 0.44–1.00)
Chloride: 92 mmol/L — ABNORMAL LOW (ref 101–111)
GFR calc Af Amer: 21 mL/min — ABNORMAL LOW (ref >60)
GFR calc non Af Amer: 18 mL/min — ABNORMAL LOW (ref >60)
GLUCOSE: 299 mg/dL — AB (ref 65–99)
Potassium: 4.5 mmol/L (ref 3.5–5.1)
Sodium: 135 mmol/L (ref 135–145)

## 2014-12-30 LAB — BLOOD GAS, ARTERIAL
ACID-BASE EXCESS: 9.8 mmol/L — AB (ref 0.0–3.0)
Allens test (pass/fail): POSITIVE — AB
Bicarbonate: 39.3 mEq/L — ABNORMAL HIGH (ref 21.0–28.0)
FIO2: 0.32
O2 SAT: 81.2 %
PCO2 ART: 78 mmHg — AB (ref 32.0–48.0)
Patient temperature: 37
pH, Arterial: 7.31 — ABNORMAL LOW (ref 7.350–7.450)
pO2, Arterial: 50 mmHg — ABNORMAL LOW (ref 83.0–108.0)

## 2014-12-30 LAB — GLUCOSE, CAPILLARY
GLUCOSE-CAPILLARY: 325 mg/dL — AB (ref 65–99)
GLUCOSE-CAPILLARY: 358 mg/dL — AB (ref 65–99)
GLUCOSE-CAPILLARY: 397 mg/dL — AB (ref 65–99)
Glucose-Capillary: 355 mg/dL — ABNORMAL HIGH (ref 65–99)

## 2014-12-30 LAB — CBC
HEMATOCRIT: 30.2 % — AB (ref 35.0–47.0)
Hemoglobin: 8.8 g/dL — ABNORMAL LOW (ref 12.0–16.0)
MCH: 21.3 pg — AB (ref 26.0–34.0)
MCHC: 29 g/dL — AB (ref 32.0–36.0)
MCV: 73.3 fL — AB (ref 80.0–100.0)
Platelets: 272 10*3/uL (ref 150–440)
RBC: 4.12 MIL/uL (ref 3.80–5.20)
RDW: 17.8 % — AB (ref 11.5–14.5)
WBC: 26 10*3/uL — ABNORMAL HIGH (ref 3.6–11.0)

## 2014-12-30 MED ORDER — ENOXAPARIN SODIUM 30 MG/0.3ML ~~LOC~~ SOLN
30.0000 mg | Freq: Every day | SUBCUTANEOUS | Status: DC
  Administered 2014-12-30: 30 mg via SUBCUTANEOUS
  Filled 2014-12-30: qty 0.3

## 2014-12-30 MED ORDER — HEPARIN SODIUM (PORCINE) 5000 UNIT/ML IJ SOLN
5000.0000 [IU] | Freq: Three times a day (TID) | INTRAMUSCULAR | Status: DC
  Administered 2014-12-30 – 2015-01-03 (×13): 5000 [IU] via SUBCUTANEOUS
  Filled 2014-12-30 (×13): qty 1

## 2014-12-30 MED ORDER — INSULIN ASPART 100 UNIT/ML ~~LOC~~ SOLN
8.0000 [IU] | Freq: Three times a day (TID) | SUBCUTANEOUS | Status: DC
  Administered 2014-12-30 – 2015-01-03 (×13): 8 [IU] via SUBCUTANEOUS
  Filled 2014-12-30 (×13): qty 8

## 2014-12-30 MED ORDER — BISACODYL 10 MG RE SUPP
10.0000 mg | Freq: Once | RECTAL | Status: AC
  Administered 2014-12-30: 16:00:00 10 mg via RECTAL
  Filled 2014-12-30: qty 1

## 2014-12-30 MED ORDER — INSULIN GLARGINE 100 UNIT/ML ~~LOC~~ SOLN
25.0000 [IU] | Freq: Every day | SUBCUTANEOUS | Status: DC
  Administered 2014-12-30: 22:00:00 25 [IU] via SUBCUTANEOUS
  Filled 2014-12-30 (×2): qty 0.25

## 2014-12-30 MED ORDER — METHYLPREDNISOLONE SODIUM SUCC 125 MG IJ SOLR
60.0000 mg | Freq: Every day | INTRAMUSCULAR | Status: DC
  Administered 2014-12-31: 60 mg via INTRAVENOUS
  Filled 2014-12-30: qty 2

## 2014-12-30 MED ORDER — SODIUM CHLORIDE 0.9 % IV SOLN
INTRAVENOUS | Status: DC
  Administered 2014-12-30 – 2015-01-02 (×7): via INTRAVENOUS

## 2014-12-30 NOTE — Plan of Care (Signed)
Problem: Discharge Progression Outcomes Goal: Dyspnea controlled Outcome: Not Progressing Pt was lethargic today at some points.  ABG done again - and pCO2 was improved from yesterday.  Dr feels like she's CO2 retaining b/c of 5L on [place] - weaned her to 3L and she's sating 88%.  Pt's BUN and creatinine  Are increasing, so IVF were restarted. Pt saw Oncology NP - voiced that she doesn't want biopsy work up at this time.  Pt c/o constipation - got dulcolax suppository.  No result by this time.

## 2014-12-30 NOTE — Progress Notes (Signed)
[facility] [place] at [facility]   PATIENT NAME: Rhonda Garner    MR#:  [id number]  DATE OF BIRTH:  May 12, 1946  SUBJECTIVE:  CHIEF COMPLAINT:   Chief Complaint  Patient presents with  . Respiratory Distress   Patient here due to shortness of breath suspected to be due to COPD exacerbation and noted to have a Lung Mass. CT 8/17 confirmed Lung mass which had grown in size.  Still a bit lethargic this a.m  REVIEW OF SYSTEMS:    Review of Systems  Constitutional: Negative for fever and chills.  HENT: Negative for congestion and tinnitus.   Eyes: Negative for blurred vision and double vision.  Respiratory: Positive for cough, shortness of breath and wheezing (end-exp. wheezing. ). Negative for hemoptysis and sputum production.   Cardiovascular: Negative for chest pain, orthopnea and PND.  Gastrointestinal: Negative for nausea, vomiting, abdominal pain and diarrhea.  Genitourinary: Negative for dysuria and hematuria.  Neurological: Positive for weakness (generalized). Negative for dizziness, sensory change and focal weakness.  All other systems reviewed and are negative.   Nutrition: Heart healthy carb control Tolerating Diet: Yes Tolerating PT: Ambulatory   DRUG ALLERGIES:   Allergies  Allergen Reactions  . Nickel Other (See Comments)  . Simvastatin Other (See Comments)    VITALS:  Blood pressure 127/68, pulse 101, temperature 97.9 F (36.6 C), temperature source Oral, resp. rate 18, height 5\' 4"  (1.626 m), weight 98.657 kg (217 lb 8 oz), SpO2 94 %.  PHYSICAL EXAMINATION:   Physical Exam  GENERAL:  69 y.o.-year-old obese patient lying in the bed in mild respiratory distress and lethargic EYES: Pupils equal, round, reactive to light and accommodation. No scleral icterus. Extraocular muscles intact.  HEENT: Head atraumatic, normocephalic. Oropharynx and nasopharynx clear.  NECK:  Supple, no jugular venous distention. No thyroid enlargement, no  tenderness.  LUNGS: Prolonged inspiratory and expiratory phase. End expiratory wheezing. No rales, rhonchi.  (-) use of accessory muscles.  CARDIOVASCULAR: S1, S2 normal. No murmurs, rubs, or gallops.  ABDOMEN: Soft, nontender, nondistended. Bowel sounds present. No organomegaly or mass.  EXTREMITIES: No cyanosis, clubbing or edema b/l.    NEUROLOGIC: Cranial nerves II through XII are intact. No focal Motor or sensory deficits b/l. Globally weak and lethargic. PSYCHIATRIC: The patient is alert and oriented x 3.  SKIN: No obvious rash, lesion, or ulcer.    LABORATORY PANEL:   CBC  Recent Labs Lab 12/30/14 0436  WBC 26.0*  HGB 8.8*  HCT 30.2*  PLT 272   ------------------------------------------------------------------------------------------------------------------  Chemistries   Recent Labs Lab 12/27/14 1454 12/27/14 1805  12/30/14 0436  NA 147*  --   < > 135  K 3.3*  --   < > 4.5  CL 92*  --   < > 92*  CO2 45*  --   < > 34*  GLUCOSE 150*  --   < > 299*  BUN 22*  --   < > 76*  CREATININE 1.73*  --   < > 2.58*  CALCIUM 9.2  --   < > 8.5*  MG  --  2.0  --   --   AST 29  --   --   --   ALT 22  --   --   --   ALKPHOS 96  --   --   --   BILITOT 0.5  --   --   --   < > = values in this interval not displayed. ------------------------------------------------------------------------------------------------------------------  Cardiac Enzymes  Recent Labs Lab 12/28/14 0503  TROPONINI 0.06*   ------------------------------------------------------------------------------------------------------------------  RADIOLOGY:  Ct Chest Wo Contrast  12/28/2014   CLINICAL DATA:  Worsening right lung mass. Shortness of breath likely due to COPD exacerbation. Symptoms since 12/27/2014.  EXAM: CT CHEST WITHOUT CONTRAST  TECHNIQUE: Multidetector CT imaging of the chest was performed following the standard protocol without IV contrast.  COMPARISON:  Plain film of the chest 12/27/2014  and CT chest 03/16/2014.  FINDINGS: A subcarinal lymph node to the right on image 26 measures 1.1 cm short axis dimension. No axillary lymphadenopathy is identified. No pleural or pericardial effusion. Cardiomegaly is noted. Mild aortic atherosclerosis is identified.  The lungs demonstrate changes of centrilobular emphysema. Mass in the right lower lobe which had measured 4.2 x 3.1 cm has increased in size to 5.7 x 4.6 cm on image 36. The mass abuts the pleural surface of the lateral chest and right hemidiaphragm. A few small calcifications are identified in the mass, new since the prior exam. Mild basilar atelectasis is noted. Scarring in the periphery of the lingula is unchanged.  Incidentally imaged upper abdomen including the adrenal glands is unremarkable.  IMPRESSION: Interval increase in the size of a right lower lobe pulmonary mass in this patient with emphysema is most worrisome for bronchogenic carcinoma. 1.1 cm subcarinal lymph node may be incidental but could be mildly enlarged due to metastatic disease.  Cardiomegaly without edema.   Electronically Signed   By: Drusilla Kanner M.D.   On: 12/28/2014 14:51   US Venous Img Lower Bilateral  12/28/2014   CLINICAL DATA:  Shortness of Breath  EXAM: BILATERAL LOWER EXTREMITY VENOUS DOPPLER ULTRASOUND  TECHNIQUE: Gray-scale sonography with compression, as well as color and duplex ultrasound, were performed to evaluate the deep venous system from the level of the common femoral vein through the popliteal and proximal calf veins.  COMPARISON:  None  FINDINGS: Normal compressibility of the common femoral, superficial femoral, and popliteal veins, as well as the proximal calf veins. No filling defects to suggest DVT on grayscale or color Doppler imaging. Doppler waveforms show normal direction of venous flow, normal respiratory phasicity and response to augmentation.  IMPRESSION: 1. No evidence of lower extremity deep vein thrombosis, BILATERALLY.    Electronically Signed   By: Corlis Leak M.D.   On: 12/28/2014 15:39     ASSESSMENT AND PLAN:   69 year old female with past medical history of COPD, diabetes, hypertension, hyperlipidemia, chronic anemia, chronic kidney disease stage III, who presented to the hospital due to shortness of breath and noted to be in COPD exacerbation.  #1 COPD exacerbation-clinically improving. Continue IV steroids and slow to improve.  -Continue duo nebs around-the-clock, Dulera, Spiriva. Pt. Already on O2 at home -CT chest 12/28/14 showing increase in the size of lung mass. No evidence of overt pneumonia and hold off on antibiotics presently.  #2 Acute on Chronic Hypoxic Hypercarbic Resp. Failure - likely due to COPD Exacerbation.  -Patient is still quite lethargic and is on 5 L oxygen. We'll get stat ABG. If needed will start BiPAP again. -patient apparently is on 5 L of oxygen at home but that may be too much for her and will attempt to wean down and tolerate sats 88% or higher. - cont. Treatment of COPD as mentioned above.   #3 lung mass-incidentally noted on chest x-ray on admission. -Has CT chest 8/17 which showed increase in size of the lung mass. Appreciate pulmonary/oncology input. Patient presently does  not want any workup or intervention. The next step would be a biopsy likely transthoracic and also a PET scan for staging and this can be done as an outpatient once the patient agrees to it.  #4 suspected PE-ruled out. Dopplers of lower extremities negative for DVT yesterday. Off HEPARIN drip.  #5 type 2 diabetes-blood sugars still uncontrolled due to IV steroids. -HemoglobinA1c is 6.8.   -Continue Lantus, NovoLog with meals, sliding scale insulin but will need to advance dose.  #6 hypertension-continue Norvasc.  #7 hyperlipidemia-continue Pravachol.  #8 chronic kidney disease stage III-patient's creatinine up to 2.5 today. She does have a history of chronic kidney disease with a baseline Cr. around  1.7-2.  We'll start patient on some gentle IV fluids and follow BUN/creatinine.  #9elevated troponin-likely in the setting of demand ischemia from hypoxia and COPD exacerbation. No evidence of acute coronary syndrome or chest pain.   Seen by PT and they recommend SNF/STR and will discuss w/ CM.    All the records are reviewed and case discussed with Care Management/Social Workerr. Management plans discussed with the patient, family and they are in agreement.  CODE STATUS: Full  DVT Prophylaxis: Heparin normogram  TOTAL CRITICAL CARE TIME TAKING CARE OF THIS PATIENT: 30 minutes.   POSSIBLE D/C IN 2-3 DAYS, DEPENDING ON CLINICAL CONDITION.   [place] Siren M.D on 12/30/2014 at 11:28 AM  Between 7am to 6pm - Pager - [phone]  After 6pm go to [email or web address] - password [email or web address]  [facility]  Office  [phone]  CC: Primary care physician; Sherrie Mustache, MD

## 2014-12-30 NOTE — Consult Note (Signed)
[facility]  [phone]  [phone]     Aleksandra Raben DOB: 1945-12-30  MR#: [id number]  DGL#:[id number]  Patient Care Team: Sherrie Mustache, MD as PCP - General (Internal Medicine)  CHIEF COMPLAINT:  Chief Complaint  Patient presents with  . Respiratory Distress   Patient with known history of chronic respiratory failure on home oxygen 3-5 L, COPD, hypertension, diabetes, CK D stage III and chronic diastolic CHF. The patient has chronic respiratory failure on home oxygen. She started to have a worsening shortness of breath today. EMS reported that the patient oxygen level was 67 to 70s on room air. She was treated with IV Solu-Medrol and DuoNeb twice and sent to the ED for further evaluation. Her O2 saturation is at 80s with oxygen 6 L by nasal cannular in the ED. Chest x-ray showed worsening right lung mass. ABG show pH 7.45, PCO2 72 and a PCO2 59.   INTERVAL HISTORY:  Patient was originally admitted with shortness of breath, suspected due to COPD exacerbation. Noted on CT scan to have new lung mass which has increased in size from previous scans. Patient has previously expressed desire to not pursue workup of any kind. Today she appears intermittently confused and unsure or of plans for treatment. She mentions wanting to talk with her family but doesn't know if her family is point come here to see her at the hospital or wait until she is discharged home.  REVIEW OF SYSTEMS:   Review of Systems  Constitutional: Positive for malaise/fatigue. Negative for fever, chills, weight loss and diaphoresis.  HENT: Negative for congestion, ear discharge, ear pain, hearing loss, nosebleeds, sore throat and tinnitus.   Eyes: Negative for blurred vision, double vision, photophobia, pain, discharge and redness.  Respiratory: Positive for cough, shortness of breath and wheezing. Negative for hemoptysis, sputum production and stridor.   Cardiovascular: Positive for leg  swelling. Negative for chest pain, palpitations, orthopnea, claudication and PND.  Gastrointestinal: Negative for heartburn, nausea, vomiting, abdominal pain, diarrhea, constipation, blood in stool and melena.  Genitourinary: Negative.   Musculoskeletal: Negative.   Skin: Negative.   Neurological: Positive for focal weakness and weakness. Negative for dizziness, tingling, seizures and headaches.  Endo/Heme/Allergies: Does not bruise/bleed easily.  Psychiatric/Behavioral: Positive for memory loss. Negative for depression. The patient is not nervous/anxious and does not have insomnia.     As per HPI. Otherwise, a complete review of systems is negatve.  ONCOLOGY HISTORY:  No history exists.    PAST MEDICAL HISTORY: Past Medical History  Diagnosis Date  . COPD (chronic obstructive pulmonary disease)   . Chronic respiratory failure   . Diabetes mellitus without complication   . Chronic diastolic heart failure   . Hypertension   . 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 Cholecystectomy      FAMILY HISTORY Family History  Problem Relation Age of Onset  . Hypertension Mother   . Hypertension Father     GYNECOLOGIC HISTORY:  No LMP recorded. Patient is postmenopausal.     ADVANCED DIRECTIVES:    HEALTH MAINTENANCE: Social History  Substance Use Topics  . Smoking status: Former Smoker -- 3.00 packs/day for 20 years  . Smokeless tobacco: None  . Alcohol Use: No     Colonoscopy:  PAP:  Bone density:  Lipid panel:  Allergies  Allergen Reactions  . Nickel  Other (See Comments)  . Simvastatin Other (See Comments)    Current Facility-Administered Medications  Medication Dose Route Frequency Provider Last Rate Last Dose  . 0.9 %  sodium chloride infusion   Intravenous Continuous [place] Siren, MD 100 mL/hr at 12/30/14 0857      . acetaminophen (TYLENOL) tablet 650 mg  650 mg Oral Q6H PRN Shaune Pollack, MD       Or  . acetaminophen (TYLENOL) suppository 650 mg  650 mg Rectal Q6H PRN Shaune Pollack, MD      . albuterol (PROVENTIL) (2.5 MG/3ML) 0.083% nebulizer solution 2.5 mg  2.5 mg Nebulization Q2H PRN Shaune Pollack, MD      . amLODipine (NORVASC) tablet 5 mg  5 mg Oral Daily [place] Siren, MD   5 mg at 12/30/14 0859  . aspirin chewable tablet 81 mg  81 mg Oral Daily Shaune Pollack, MD   81 mg at 12/30/14 0858  . bisacodyl (DULCOLAX) suppository 10 mg  10 mg Rectal Once [place] Siren, MD      . heparin injection 5,000 Units  5,000 Units Subcutaneous 3 times per day [place] Siren, MD      . insulin aspart (novoLOG) injection 0-15 Units  0-15 Units Subcutaneous TID WC [place] Siren, MD   15 Units at 12/30/14 1203  . insulin aspart (novoLOG) injection 0-5 Units  0-5 Units Subcutaneous QHS [place] Siren, MD   5 Units at 12/29/14 2234  . insulin aspart (novoLOG) injection 8 Units  8 Units Subcutaneous TID WC [place] Siren, MD   8 Units at 12/30/14 1204  . insulin glargine (LANTUS) injection 25 Units  25 Units Subcutaneous QHS [place] Siren, MD      . levalbuterol [facility]) nebulizer solution 1.25 mg  1.25 mg Nebulization Q4H [place] Si[place], MD   1.25 mg at 12/30/14 1130  . [START ON 12/31/2014] methylPREDNISolone sodium succinate (SOLU-MEDROL) 125 mg/2 mL injection 60 mg  60 mg Intravenous Daily [place] Siren, MD      . mometasone-formoterol (DULERA) 200-5 MCG/ACT inhaler 2 puff  2 puff Inhalation BID [place] Si[place], MD   2 puff at 12/30/14 0859  . ondansetron (ZOFRAN) tablet 4 mg  4 mg Oral Q6H PRN Shaune Pollack, MD       Or  . ondansetron [facility]) injection 4 mg  4 mg Intravenous Q6H PRN Shaune Pollack, MD      . pravastatin (PRAVACHOL) tablet 40 mg  40 mg Oral Daily [place] Siren, MD   40 mg at 12/30/14 0858  . sodium chloride 0.9 % injection 3 mL  3 mL Intravenous Q12H Shaune Pollack, MD   3 mL at 12/30/14 0858  . tiotropium  (SPIRIVA) inhalation capsule 18 mcg  18 mcg Inhalation Daily [place], MD   18 mcg at 12/30/14 0859  . Vitamin D (Ergocalciferol) (DRISDOL) capsule 50,000 Units  50,000 Units Oral Q7 days [place] Si[place], MD   50,000 Units at 12/28/14 1219    OBJECTIVE: BP 127/68 mmHg  Pulse 101  Temp(Src) 97.9 F (36.6 C) (Oral)  Resp 18  Ht 5\' 4"  (1.626 m)  Wt 217 lb 8 oz (98.657 kg)  BMI 37.32 kg/m2  SpO2 94%   Body mass index is 37.32 kg/(m^2).    ECOG FS:3 - Symptomatic, >50% confined to bed  General: Well-developed, well-nourished, no acute distress. Obese Eyes: Pink conjunctiva, anicteric sclera. HEENT: Normocephalic, moist mucous membranes, clear oropharnyx. Lungs: Patient with  expiratory wheezing. Heart: Regular rate and rhythm. No rubs, murmurs, or gallops. Abdomen: Soft, nontender, nondistended. No organomegaly noted, normoactive bowel sounds. Musculoskeletal: No edema, cyanosis, or clubbing. Neuro: Alert, answering all questions appropriately. Cranial nerves grossly intact. Skin: No rashes or petechiae noted. Psych: Normal affect.   LAB RESULTS:  Admission on 12/27/2014  No results displayed because visit has over 200 results.      STUDIES: Ct Chest Wo Contrast  12/28/2014   CLINICAL DATA:  Worsening right lung mass. Shortness of breath likely due to COPD exacerbation. Symptoms since 12/27/2014.  EXAM: CT CHEST WITHOUT CONTRAST  TECHNIQUE: Multidetector CT imaging of the chest was performed following the standard protocol without IV contrast.  COMPARISON:  Plain film of the chest 12/27/2014 and CT chest 03/16/2014.  FINDINGS: A subcarinal lymph node to the right on image 26 measures 1.1 cm short axis dimension. No axillary lymphadenopathy is identified. No pleural or pericardial effusion. Cardiomegaly is noted. Mild aortic atherosclerosis is identified.  The lungs demonstrate changes of centrilobular emphysema. Mass in the right lower lobe which had measured 4.2 x 3.1 cm has  increased in size to 5.7 x 4.6 cm on image 36. The mass abuts the pleural surface of the lateral chest and right hemidiaphragm. A few small calcifications are identified in the mass, new since the prior exam. Mild basilar atelectasis is noted. Scarring in the periphery of the lingula is unchanged.  Incidentally imaged upper abdomen including the adrenal glands is unremarkable.  IMPRESSION: Interval increase in the size of a right lower lobe pulmonary mass in this patient with emphysema is most worrisome for bronchogenic carcinoma. 1.1 cm subcarinal lymph node may be incidental but could be mildly enlarged due to metastatic disease.  Cardiomegaly without edema.   Electronically Signed   By: Drusilla Kanner M.D.   On: 12/28/2014 14:51   US Venous Img Lower Bilateral  12/28/2014   CLINICAL DATA:  Shortness of Breath  EXAM: BILATERAL LOWER EXTREMITY VENOUS DOPPLER ULTRASOUND  TECHNIQUE: Gray-scale sonography with compression, as well as color and duplex ultrasound, were performed to evaluate the deep venous system from the level of the common femoral vein through the popliteal and proximal calf veins.  COMPARISON:  None  FINDINGS: Normal compressibility of the common femoral, superficial femoral, and popliteal veins, as well as the proximal calf veins. No filling defects to suggest DVT on grayscale or color Doppler imaging. Doppler waveforms show normal direction of venous flow, normal respiratory phasicity and response to augmentation.  IMPRESSION: 1. No evidence of lower extremity deep vein thrombosis, BILATERALLY.   Electronically Signed   By: Corlis Leak M.D.   On: 12/28/2014 15:39   Dg Chest Portable 1 View  12/27/2014   CLINICAL DATA:  Increase shortness of breath, hypoxemia beginning last night.  EXAM: PORTABLE CHEST - 1 VIEW  COMPARISON:  10/10/2013  FINDINGS: There is cardiomegaly. Continued right basilar opacity, enlarged since prior study. Cannot exclude enlarging mass/cancer in the right lung base as  described on prior CT. No overt edema. No visible effusions. No acute bony abnormality.  IMPRESSION: Worsening masslike opacity at the right lung base. Cannot exclude lung cancer as described on prior chest CT.  Cardiomegaly.   Electronically Signed   By: Charlett Nose M.D.   On: 12/27/2014 15:28    ASSESSMENT:  Lung mass  PLAN:   1. Lung mass. Patient has right lower lobe lung mass, previously evaluated by Dr. Lorre Nick in January 2016. Lung mass in comparison to  previous CT scans has increased in size there is also subcarinal lymph node involvement, consistent with carcinoma of lung. On previous evaluation with Dr. Lorre Nick she had refused any further evaluation. Patient continues to refuse evaluation with PET scan and for a biopsy. She states that she is pointing to discuss this with her family but at this time continues to deny any further workup. Discussed with patient the likelihood that this is cancer and will continue to grow. Discussed with patient that if any of her family members had any further questions that they could contact the on-call provider. If patient changes her mind regarding further workup we can see as outpatient and schedule the appropriate testing.  Patient expressed understanding and was in agreement with this plan. She also understands that She can call clinic at any time with any questions, concerns, or complaints.   Dr. [name] was available for consultation and review of plan of care for this patient.   Loann Quill, NP   12/30/2014 3:04 PM

## 2014-12-30 NOTE — Plan of Care (Signed)
Problem: Discharge Progression Outcomes Goal: Other Discharge Outcomes/Goals Outcome: Progressing Assumed care of patient at 2300. Pt rested most of the night. Shift assessment performed around 4 am so as not to disturb patient from her sleep. Pt up to BR with one person assist. No bm in 4 days per patient. Pt given hot decaf coffee per her request because she said it helps her have a bm. Refused bipap but wore her oxygen via [place] throughout the night. Pt may need a laxative this am will pass off to oncoming nurse. Pt also needs home health care at discharge because she plans to go back home where she lives alone.

## 2014-12-30 NOTE — Progress Notes (Addendum)
Inpatient Diabetes Program Recommendations  AACE/ADA: New Consensus Statement on Inpatient Glycemic Control (2013)  Target Ranges:  Prepandial:   less than 140 mg/dL      Peak postprandial:   less than 180 mg/dL (1-2 hours)      Critically ill patients:  140 - 180 mg/dL   Diabetes history: Type 2 diabetes Outpatient Diabetes medications: Glucotrol 10 mg bid Current orders for Inpatient glycemic control:  Lantus 20 units q HS, Novolog 5 units tid with meals, Novolog moderate correction insulin tid with meals  Post prandial blood sugars remain elevated- consider increasing Novolog correction to resistant scale 0-20 units tid.  Consider increasing mealtime insulin to Novolog 8 units tid with meals.   Fasting blood sugar elevated- consider increasing Lantus insulin to 25 units beginning tonight.   Will follow while in the hospital.   Susette Racer, RN, BA, [place], CDE Diabetes Coordinator Inpatient Diabetes Program  [phone] (Team Pager) [phone] [facility] Office) 12/30/2014 8:20 AM

## 2014-12-31 ENCOUNTER — Inpatient Hospital Stay: Payer: Medicare Other

## 2014-12-31 LAB — GLUCOSE, CAPILLARY
GLUCOSE-CAPILLARY: 240 mg/dL — AB (ref 65–99)
GLUCOSE-CAPILLARY: 294 mg/dL — AB (ref 65–99)
Glucose-Capillary: 284 mg/dL — ABNORMAL HIGH (ref 65–99)
Glucose-Capillary: 376 mg/dL — ABNORMAL HIGH (ref 65–99)

## 2014-12-31 LAB — BASIC METABOLIC PANEL
Anion gap: 9 (ref 5–15)
BUN: 97 mg/dL — AB (ref 6–20)
CHLORIDE: 96 mmol/L — AB (ref 101–111)
CO2: 32 mmol/L (ref 22–32)
CREATININE: 2.96 mg/dL — AB (ref 0.44–1.00)
Calcium: 8.3 mg/dL — ABNORMAL LOW (ref 8.9–10.3)
GFR calc non Af Amer: 15 mL/min — ABNORMAL LOW (ref >60)
GFR, EST AFRICAN AMERICAN: 18 mL/min — AB (ref >60)
GLUCOSE: 331 mg/dL — AB (ref 65–99)
Potassium: 4.1 mmol/L (ref 3.5–5.1)
Sodium: 137 mmol/L (ref 135–145)

## 2014-12-31 LAB — PROTEIN / CREATININE RATIO, URINE
CREATININE, URINE: 77 mg/dL
Protein Creatinine Ratio: 0.35 mg/mg{Cre} — ABNORMAL HIGH (ref 0.00–0.15)
Total Protein, Urine: 27 mg/dL

## 2014-12-31 MED ORDER — INSULIN GLARGINE 100 UNIT/ML ~~LOC~~ SOLN
35.0000 [IU] | Freq: Every day | SUBCUTANEOUS | Status: DC
  Administered 2014-12-31: 21:00:00 35 [IU] via SUBCUTANEOUS
  Filled 2014-12-31 (×2): qty 0.35

## 2014-12-31 MED ORDER — METHYLPREDNISOLONE SODIUM SUCC 40 MG IJ SOLR
40.0000 mg | Freq: Every day | INTRAMUSCULAR | Status: DC
  Administered 2015-01-01: 40 mg via INTRAVENOUS
  Filled 2014-12-31: qty 1

## 2014-12-31 MED ORDER — LEVALBUTEROL HCL 1.25 MG/0.5ML IN NEBU
1.2500 mg | INHALATION_SOLUTION | Freq: Four times a day (QID) | RESPIRATORY_TRACT | Status: DC
  Administered 2015-01-01 – 2015-01-03 (×10): 1.25 mg via RESPIRATORY_TRACT
  Filled 2014-12-31 (×10): qty 0.5

## 2014-12-31 NOTE — Plan of Care (Addendum)
Problem: Discharge Progression Outcomes Goal: Other Discharge Outcomes/Goals Outcome: Progressing Plan of care progress to goal: Pt on 3L [place]. O2 sats stable. Solumedrol decreased to  daily. Receiving duonebs. Standby assist to bathroom.  Patient wants to go home, forgetful at times. RN had to remind patient of plan a few times today. No c/o pain. Blood sugars remain high, but in 200s all day. Reinforced diabetes education with patient. U/S of abd and nephrology consult ordered for CKD.

## 2014-12-31 NOTE — Progress Notes (Signed)
[facility]  ROUNDING NOTE   Subjective:  Pt known to [place] from prior admission. She's a poor historian. Admitted with shortness of breath. Found to have right lung mass. Has acute renal failure with worsening BUN/Cr.     Objective:  Vital signs in last 24 hours:  Temp:  [97.9 F (36.6 C)-98.6 F (37 C)] 98.6 F (37 C) (08/20 1356) Pulse Rate:  [98-108] 108 (08/20 1356) Resp:  [20] 20 (08/20 0529) BP: (111-138)/(58-73) 120/73 mmHg (08/20 1356) SpO2:  [88 %-94 %] 88 % (08/20 1356)  Weight change:  Filed Weights   12/27/14 1448 12/27/14 1925  Weight: 104.327 kg (230 lb) 98.657 kg (217 lb 8 oz)    Intake/Output: I/O last 3 completed shifts: In: 480 [P.O.:480] Out: 1151 [Urine:1150; Stool:1]   Intake/Output this shift:  Total I/O In: 480 [P.O.:480] Out: -   Physical Exam: General: NAD, obese  Head: Normocephalic, atraumatic. Moist oral mucosal membranes  Eyes: Anicteric  Neck: Supple, trachea midline  Lungs:  Scattered rhonchi, normal effort  Heart: Regular rate and rhythm S1S2  Abdomen:  Soft, nontender, BS present  Extremities:  no peripheral edema.  Neurologic: Nonfocal, moving all four extremities  Skin: No lesions       Basic Metabolic Panel:  Recent Labs Lab 12/27/14 1454 12/27/14 1805 12/28/14 0109 12/29/14 0524 12/30/14 0436 12/31/14 0515  NA 147*  --  138 137 135 137  K 3.3*  --  3.7 4.3 4.5 4.1  CL 92*  --  91* 93* 92* 96*  CO2 45*  --  36* 37* 34* 32  GLUCOSE 150*  --  443* 316* 299* 331*  BUN 22*  --  35* 49* 76* 97*  CREATININE 1.73*  --  2.12* 2.05* 2.58* 2.96*  CALCIUM 9.2  --  8.3* 8.4* 8.5* 8.3*  MG  --  2.0  --   --   --   --     Liver Function Tests:  Recent Labs Lab 12/27/14 1454  AST 29  ALT 22  ALKPHOS 96  BILITOT 0.5  PROT 7.4  ALBUMIN 3.7   No results for input(s): LIPASE, AMYLASE in the last 168 hours. No results for input(s): AMMONIA in the last 168 hours.  CBC:  Recent Labs Lab 12/27/14 1454  12/28/14 0109 12/29/14 0524 12/30/14 0436  WBC 10.5 10.5 27.9* 26.0*  HGB 9.8* 8.1* 8.0* 8.8*  HCT 33.5* 27.8* 27.0* 30.2*  MCV 73.0* 73.2* 73.0* 73.3*  PLT 312 238 249 272    Cardiac Enzymes:  Recent Labs Lab 12/27/14 1454 12/27/14 1805 12/28/14 0108 12/28/14 0503  TROPONINI 0.08* 0.07* 0.07* 0.06*    BNP: Invalid input(s): POCBNP  CBG:  Recent Labs Lab 12/30/14 1103 12/30/14 1732 12/30/14 2200 12/31/14 0722 12/31/14 1119  GLUCAP 358* 355* 397* 284* 240*    Microbiology: Results for orders placed or performed in visit on 10/10/13  Culture, blood (single)     Status: None   Collection Time: 10/10/13  5:19 PM  Result Value Ref Range Status   Micro Text Report   Final       COMMENT                   NO GROWTH AEROBICALLY/ANAEROBICALLY IN 5 DAYS   ANTIBIOTIC  Culture, blood (single)     Status: None   Collection Time: 10/10/13  5:19 PM  Result Value Ref Range Status   Micro Text Report   Final       COMMENT                   NO GROWTH AEROBICALLY/ANAEROBICALLY IN 5 DAYS   ANTIBIOTIC                                                        Coagulation Studies: No results for input(s): LABPROT, INR in the last 72 hours.  Urinalysis: No results for input(s): COLORURINE, LABSPEC, PHURINE, GLUCOSEU, HGBUR, BILIRUBINUR, KETONESUR, PROTEINUR, UROBILINOGEN, NITRITE, LEUKOCYTESUR in the last 72 hours.  Invalid input(s): APPERANCEUR    Imaging: US Renal  12/31/2014   CLINICAL DATA:  Acute on chronic renal failure  EXAM: RENAL / URINARY TRACT ULTRASOUND COMPLETE  COMPARISON:  10/11/2013  FINDINGS: Right Kidney:  Length: 10.2 cm. Echogenicity within normal limits. No mass or hydronephrosis visualized.  Left Kidney:  Length: 12.1 cm. A 2 cm cyst is again seen in the lower pole slightly decreased from the prior exam.  Bladder:  Appears normal for degree of bladder distention.  IMPRESSION: Left renal cyst  decreased in size from the prior study.  No other focal abnormality is noted.   Electronically Signed   By: Alcide Clever M.D.   On: 12/31/2014 12:39     Medications:   . sodium chloride 100 mL/hr at 12/31/14 0522   . amLODipine  5 mg Oral Daily  . aspirin  81 mg Oral Daily  . heparin subcutaneous  5,000 Units Subcutaneous 3 times per day  . insulin aspart  0-15 Units Subcutaneous TID WC  . insulin aspart  0-5 Units Subcutaneous QHS  . insulin aspart  8 Units Subcutaneous TID WC  . insulin glargine  35 Units Subcutaneous QHS  . levalbuterol  1.25 mg Nebulization Q4H  . [START ON 01/01/2015] methylPREDNISolone (SOLU-MEDROL) injection  40 mg Intravenous Daily  . mometasone-formoterol  2 puff Inhalation BID  . pravastatin  40 mg Oral Daily  . sodium chloride  3 mL Intravenous Q12H  . tiotropium  18 mcg Inhalation Daily  . Vitamin D (Ergocalciferol)  50,000 Units Oral Q7 days   acetaminophen **OR** acetaminophen, albuterol, ondansetron **OR** ondansetron (ZOFRAN) IV  Assessment/ Plan:  Patient is a 69 y.o. female with a PMHx of COPD, chronic respiratory failure, diabetes mellitus, chronic diastolic heart failure, hypertension, CKD stage III, who was admitted to [facility] on 12/27/2014 for evaluation of shortness of breath.  She is a rather poor historian and unable to offer accurate details.  She apparently was admitted with worsening shortness of breath.  O2 sat was in the 60-70s en route here.   Imaging also revealed right lung mass.    1.  Acute renal failure/CKD stage III/proteinuria: Pt admitted with shortness of breath but now having worsening renal function.  Renal US negative for hydronephrosis.  Cause of renal insufficiency unclear at the moment.  Will send off serologic work up however.  Continue IVF hydration for now, will continue to monitor renal function trend.  May end up needing dialysis if renal fu nction continues to worsen.    2.  Hypertension: continue amlodipine  po daily  for  now.     3.  Anemia of CKD:  hgb low at 8.8, would avoid epogen given lung mass, check spep and upep as above.  LOS: 4 Anslee Micheletti 8/20/20163:14 PM

## 2014-12-31 NOTE — Plan of Care (Signed)
Problem: Discharge Progression Outcomes Goal: Other Discharge Outcomes/Goals Outcome: Progressing Plan of Care Progress to Goal:   Pt has been reporting right thigh pain that feels like the skin is moving into the muscle. Pt report the tylenol and repositioning helps. Pt still is SOB on exertion. No other signs of distress noted. Will continue to monitor.

## 2014-12-31 NOTE — Clinical Social Work Note (Addendum)
Late entry on 12/31/14 for 12/30/14 Clinical Social Work Assessment  Patient Details  Name: Rhonda Garner MRN: [id number] Date of Birth: 1945/10/27  Date of referral:  12/30/14               Reason for consult:  Facility Placement                Permission sought to share information with:  Case Manager, Customer service manager, Family Supports Permission granted to share information::  Yes, Verbal Permission Granted  Name::        Agency::  SNFs  Relationship::  niece and nephew  Contact Information:     Housing/Transportation Living arrangements for the past 2 months:  Single Family Home Source of Information:  Patient Patient Interpreter Needed:  None Criminal Activity/Legal Involvement Pertinent to Current Situation/Hospitalization:  No - Comment as needed Significant Relationships:  Pets, Other Family Members Lives with:  Self Do you feel safe going back to the place where you live?  Yes Need for family participation in patient care:  Yes (Comment)  Care giving concerns:  Pt lives alone with her 69 year old [place]. Has an aide a couple of hours a day, 4 days a week.  Social Worker assessment / plan: CSW met with Pt to discuss dc planning. Pt is single, lives alone, has a niece and nephew who she reports are supportive. Pt states that her family lives in [facility] and she thinks they are coming to the hospital. CSW and Pt and discussed Pt's care needs at dc on concern that she needs to transition through SNF before returning home. Pt states right away that she does not want to go to SNF and that she wants to go home. CSW and Pt discuss the need for more than one plan given the current challenges and she is in agreement to CSW beginning SNF bed search so that she would know her options. Pt has been to SNF in the past but cannot remember the name of it. There are SNF options closer to her family if she is interested in going to [facility]. [facility] hospital has "swing  beds" which are lower level acute beds that pt might be a candidate for if she would consider that option. CSW will create Fl2 and begin SNF bed search. CSW shared with RN CM that Pt is agreeable to home health services. [facility] was discussed given her new mass and lack of plans for intervention.   Employment status:  Disabled (Comment on whether or not currently receiving Disability) Insurance information:  Medicare, Medicaid In [place] PT Recommendations:  [place] / Referral to community resources:  [place]  Patient/Family's Response to care:  Pt appreciative of care in hospital and is looking forward to returning home.   Patient/Family's Understanding of and Emotional Response to Diagnosis, Current Treatment, and Prognosis:  Pt has been chronically ill for over 15 years with history of non-compliance at times. Pt has been to SNF in the past and does not want to go again though lacks the support at home to support her with her illness. Pt seems to appreciate the risk in returning home and is willing to have home health support at the most. Pt insurance is appropriate for SNF for rehab, LTC, and hospice if appropriate.   Emotional Assessment Appearance:  Appears stated age Attitude/Demeanor/Rapport:  Apprehensive, Guarded Affect (typically observed):  Anxious Orientation:  Oriented to Self, Oriented to Place, Oriented to  Time,  Oriented to Situation Alcohol / Substance use:  Never Used Psych involvement (Current and /or in the community):  No (Comment)  Discharge Needs  Concerns to be addressed:  Patient refuses services, Adjustment to Illness, Discharge Planning Concerns Readmission within the last 30 days:  No Current discharge risk:  Chronically ill, Lack of support system, Physical Impairment Barriers to Discharge:  Continued Medical Work up   [employer], LCSW 12/31/2014, 8:01 AM

## 2014-12-31 NOTE — Care Management Note (Signed)
Case Management Note  Patient Details  Name: Rhonda Garner MRN: [id number] Date of Birth: April 16, 1946  Subjective/Objective:    Discussed current home health arrangements with Rhonda Garner. She is adamant about returning home. Reports that she has caregivers at home but is vague about how this Clinical research associate can contact these caregivers. Has used [facility] in the past and this writer left a request with them to please return my call. Left a message on nephew Rhonda Garner phone [phone]) requesting a call back.                 Action/Plan:   Expected Discharge Date:                  Expected Discharge Plan:     In-House Referral:     Discharge planning Services     Post Acute Care Choice:    Choice offered to:     DME Arranged:    DME Agency:     HH Arranged:    HH Agency:     Status of Service:     Medicare Important Message Given:  Yes-second notification given Date Medicare IM Given:    Medicare IM give by:    Date Additional Medicare IM Given:    Additional Medicare Important Message give by:     If discussed at Long Length of Stay Meetings, dates discussed:    Additional Comments:  Duval Macleod A, RN 12/31/2014, 10:53 AM

## 2014-12-31 NOTE — Care Management Note (Signed)
Case Management Note  Patient Details  Name: Rhonda Garner MRN: [id number] Date of Birth: 09-Oct-1945  Subjective/Objective:    Left another message on nephew's phone requesting a call back. Uncertain about home care arrangements, if any, are currently in place. [name] is vague and unable to provide either names of relatives or names of any agent caretakers that she has at home.                 Action/Plan:   Expected Discharge Date:                  Expected Discharge Plan:     In-House Referral:     Discharge planning Services     Post Acute Care Choice:    Choice offered to:     DME Arranged:    DME Agency:     HH Arranged:    HH Agency:     Status of Service:     Medicare Important Message Given:  Yes-second notification given Date Medicare IM Given:    Medicare IM give by:    Date Additional Medicare IM Given:    Additional Medicare Important Message give by:     If discussed at Long Length of Stay Meetings, dates discussed:    Additional Comments:  Kaytlynne Neace A, RN 12/31/2014, 2:50 PM

## 2014-12-31 NOTE — Clinical Social Work Note (Signed)
Physician discussed concerns about wanting to know who will be in the home with CSW earlier this morning. RN CM notified and RN CM documented that patient is adamant about not going to a facility and RN CM is attempting to follow up with family.  York Spaniel MSW,LCSW [phone]

## 2014-12-31 NOTE — Progress Notes (Signed)
[facility] - [place] at [facility]   PATIENT NAME: Rhonda Garner    MR#:  [id number]  DATE OF BIRTH:  06-17-1945  SUBJECTIVE:  CHIEF COMPLAINT:   Chief Complaint  Patient presents with  . Respiratory Distress   Patient here due to shortness of breath suspected to be due to COPD exacerbation and noted to have a Lung Mass. CT 8/17 confirmed Lung mass which had grown in size.  Lethargic but easily waking up with verbal stimuli. Try to discuss this patient her wishes in regards to  Lung mass workup, however, she is not willing to do that now. Discussed with care management about patient's discharge planning and family situation. The patient tells me that she has multiple family members were able to cook and care for her, however, no family members was seen while she was here in the hospital. She admits to some cough but no significant sputum production  REVIEW OF SYSTEMS:    Review of Systems  Constitutional: Negative for fever and chills.  HENT: Negative for congestion and tinnitus.   Eyes: Negative for blurred vision and double vision.  Respiratory: Positive for cough, shortness of breath and wheezing (end-exp. wheezing. ). Negative for hemoptysis and sputum production.   Cardiovascular: Negative for chest pain, orthopnea and PND.  Gastrointestinal: Negative for nausea, vomiting, abdominal pain and diarrhea.  Genitourinary: Negative for dysuria and hematuria.  Neurological: Positive for weakness (generalized). Negative for dizziness, sensory change and focal weakness.  All other systems reviewed and are negative.   Nutrition: Heart healthy carb control Tolerating Diet: Yes Tolerating PT: Ambulatory   DRUG ALLERGIES:   Allergies  Allergen Reactions  . Nickel Other (See Comments)  . Simvastatin Other (See Comments)    VITALS:  Blood pressure 111/65, pulse 98, temperature 98.5 F (36.9 C), temperature source Oral, resp. rate 20, height  (1.626 m),  weight 98.657 kg (217 lb 8 oz), SpO2 93 %.  PHYSICAL EXAMINATION:   Physical Exam  GENERAL:  69 y.o.-year-old obese patient lying in the bed in mild respiratory distress and lethargic EYES: Pupils equal, round, reactive to light and accommodation. No scleral icterus. Extraocular muscles intact.  HEENT: Head atraumatic, normocephalic. Oropharynx and nasopharynx clear.  NECK:  Supple, no jugular venous distention. No thyroid enlargement, no tenderness.  LUNGS: Prolonged inspiratory and expiratory phase. End expiratory wheezing. No rales, rhonchi.  (-) use of accessory muscles.  CARDIOVASCULAR: S1, S2 normal. No murmurs, rubs, or gallops.  ABDOMEN: Soft, nontender, nondistended. Bowel sounds present. No organomegaly or mass.  EXTREMITIES: No cyanosis, clubbing or edema b/l.    NEUROLOGIC: Cranial nerves II through XII are intact. No focal Motor or sensory deficits b/l. Globally weak and lethargic. PSYCHIATRIC: The patient is alert and oriented x 3. Gets upset easily when discussed about her lung mass  evaluation SKIN: No obvious rash, lesion, or ulcer.    LABORATORY PANEL:   CBC  Recent Labs Lab 12/30/14 0436  WBC 26.0*  HGB 8.8*  HCT 30.2*  PLT 272   ------------------------------------------------------------------------------------------------------------------  Chemistries   Recent Labs Lab 12/27/14 1454 12/27/14 1805  12/31/14 0515  NA 147*  --   < > 137  K 3.3*  --   < > 4.1  CL 92*  --   < > 96*  CO2 45*  --   < > 32  GLUCOSE 150*  --   < > 331*  BUN 22*  --   < > 97*  CREATININE 1.73*  --   < >  2.96*  CALCIUM 9.2  --   < > 8.3*  MG  --  2.0  --   --   AST 29  --   --   --   ALT 22  --   --   --   ALKPHOS 96  --   --   --   BILITOT 0.5  --   --   --   < > = values in this interval not displayed. ------------------------------------------------------------------------------------------------------------------  Cardiac Enzymes  Recent Labs Lab  12/28/14 0503  TROPONINI 0.06*   ------------------------------------------------------------------------------------------------------------------  RADIOLOGY:  No results found.   ASSESSMENT AND PLAN:   69 year old female with past medical history of COPD, diabetes, hypertension, hyperlipidemia, chronic anemia, chronic kidney disease stage III, who presented to the hospital due to shortness of breath and noted to be in COPD exacerbation.  #1 COPD exacerbation-clinically improving. Continue tapering steroids and slow to improve, but now on 3 L of oxygen through nasal cannula which is her baseline.  -Continue duo nebs around-the-clock, Dulera, Spiriva. Pt. Already on O2 at home -CT chest 12/28/14 showing increase in the size of lung mass. No evidence of overt pneumonia and hold off on antibiotics presently. Patient is reluctant to have a lung mass workup,   #2 Acute on Chronic Hypoxic Hypercarbic Resp. Failure - likely due to COPD Exacerbation.  -Patient is still quite lethargic and is on 3 L oxygen. Refuses BiPAP in the recent past. Discussed with care management about possible assisted living facility placement, although is unclear. Patient will be agreeable for that. Social workers are asked to investigate her family situation and patient's living situation.  Treatment of COPD as mentioned above.   #3 lung mass-incidentally noted on chest x-ray on admission. -Has CT chest 8/17 which showed increase in size of the lung mass. Appreciate pulmonary/oncology input. Patient presently does not want any workup or intervention. The next step would be a biopsy likely transthoracic and also a PET scan for staging and this can be done as an outpatient once the patient agrees to it. Would arrange cancer center follow-up as outpatient  #4 suspected PE-ruled out. Dopplers of lower extremities negative for DVT yesterday. Off HEPARIN drip.  #5 type 2 diabetes-blood sugars still uncontrolled due to IV  steroids, although we will be tapering steroids. -HemoglobinA1c is 6.8.   -Continue Lantus, NovoLog with meals, sliding scale insulin but will need to advance dose,although we will be tapering steroids .  #6 hypertension-continue Norvasc. Good blood pressure control  #7 hyperlipidemia-continue Pravachol.  #8 chronic kidney disease stage III-patient's creatinine up to 2.96 today. Not much improvement. In fact, worsening since initiation of IV fluids yesterday. She does have a history of chronic kidney disease with a baseline Cr. around 1.7-2.  We'll get nephrologist involved. Get ultrasound of kidneys.  #9elevated troponin-likely in the setting of demand ischemia from hypoxia and COPD exacerbation. No evidence of acute coronary syndrome or chest pain.   Seen by PT and they recommend SNF/STR and discussed w/ CM.  as above. Because of patient's poor respiratory status. We will be investigating her family station living station to make sure that there is someone at home, to follow her mental status and her breathing pattern closely.   All the records are reviewed and case discussed with Care Management/Social Workerr. Management plans discussed with the patient, family and they are in agreement.  CODE STATUS: Full  DVT Prophylaxis: Heparin normogram  TOTAL CRITICAL CARE TIME TAKING CARE OF THIS PATIENT:  40 minutes.  Discharge planning was discussed with care management POSSIBLE D/C IN 2-3 DAYS, DEPENDING ON CLINICAL CONDITION.   Katharina Caper M.D on 12/31/2014 at 11:18 AM  Between 7am to 6pm - Pager - [phone]  After 6pm go to [email or web address] - password [email or web address] [facility]  [place] [facility]  Office  [phone]  CC: Primary care physician; Sherrie Mustache, MD

## 2015-01-01 LAB — CBC
HCT: 26.9 % — ABNORMAL LOW (ref 35.0–47.0)
Hemoglobin: 8.1 g/dL — ABNORMAL LOW (ref 12.0–16.0)
MCH: 21.8 pg — ABNORMAL LOW (ref 26.0–34.0)
MCHC: 29.9 g/dL — ABNORMAL LOW (ref 32.0–36.0)
MCV: 72.7 fL — ABNORMAL LOW (ref 80.0–100.0)
PLATELETS: 240 10*3/uL (ref 150–440)
RBC: 3.7 MIL/uL — ABNORMAL LOW (ref 3.80–5.20)
RDW: 17.7 % — AB (ref 11.5–14.5)
WBC: 16.2 10*3/uL — AB (ref 3.6–11.0)

## 2015-01-01 LAB — GLUCOSE, CAPILLARY
GLUCOSE-CAPILLARY: 229 mg/dL — AB (ref 65–99)
GLUCOSE-CAPILLARY: 350 mg/dL — AB (ref 65–99)
Glucose-Capillary: 142 mg/dL — ABNORMAL HIGH (ref 65–99)
Glucose-Capillary: 240 mg/dL — ABNORMAL HIGH (ref 65–99)

## 2015-01-01 LAB — BASIC METABOLIC PANEL
Anion gap: 5 (ref 5–15)
BUN: 90 mg/dL — AB (ref 6–20)
CALCIUM: 8.3 mg/dL — AB (ref 8.9–10.3)
CO2: 31 mmol/L (ref 22–32)
CREATININE: 2.53 mg/dL — AB (ref 0.44–1.00)
Chloride: 101 mmol/L (ref 101–111)
GFR calc Af Amer: 21 mL/min — ABNORMAL LOW (ref >60)
GFR, EST NON AFRICAN AMERICAN: 19 mL/min — AB (ref >60)
Glucose, Bld: 260 mg/dL — ABNORMAL HIGH (ref 65–99)
Potassium: 4.5 mmol/L (ref 3.5–5.1)
SODIUM: 137 mmol/L (ref 135–145)

## 2015-01-01 MED ORDER — INSULIN GLARGINE 100 UNIT/ML ~~LOC~~ SOLN
43.0000 [IU] | Freq: Every day | SUBCUTANEOUS | Status: DC
  Administered 2015-01-01 – 2015-01-02 (×2): 43 [IU] via SUBCUTANEOUS
  Filled 2015-01-01 (×3): qty 0.43

## 2015-01-01 MED ORDER — PREDNISONE 50 MG PO TABS
50.0000 mg | ORAL_TABLET | Freq: Every day | ORAL | Status: AC
  Administered 2015-01-02: 50 mg via ORAL
  Filled 2015-01-01: qty 1

## 2015-01-01 MED ORDER — PREDNISONE 20 MG PO TABS: 30.0000 mg | ORAL_TABLET | Freq: Every day | ORAL | Status: DC

## 2015-01-01 MED ORDER — PREDNISONE 10 MG PO TABS: 10.0000 mg | ORAL_TABLET | Freq: Every day | ORAL | Status: DC

## 2015-01-01 MED ORDER — PREDNISONE 20 MG PO TABS
40.0000 mg | ORAL_TABLET | Freq: Every day | ORAL | Status: AC
  Administered 2015-01-03: 40 mg via ORAL
  Filled 2015-01-01: qty 2

## 2015-01-01 MED ORDER — PREDNISONE 20 MG PO TABS: 20.0000 mg | ORAL_TABLET | Freq: Every day | ORAL | Status: DC

## 2015-01-01 NOTE — Clinical Social Work Note (Signed)
CSW went to try one more time to see if patient would be willing to reconsider her declining of rehab. Patient was frustrated with respiratory therapy when CSW arrived and sat herself up in bed on her own and took her mask off and was fussing with respiratory therapy. CSW did feel as though that was the best time to speak with patient.  CSW spoke with patient's nurse this afternoon who stated that she was able to get to her bedside chair with standby assist and did well.  York Spaniel MSW,LCSW [phone]

## 2015-01-01 NOTE — Plan of Care (Signed)
Problem: Discharge Progression Outcomes Goal: Other Discharge Outcomes/Goals Outcome: Progressing Plan of Care Progress to Goal:   Pt denies pain. Pt accidentally removed IV during shift, new IV site located. No other signs of distress noted. Will continue to monitor.

## 2015-01-01 NOTE — Progress Notes (Signed)
24 hour urine collection completed - taken to lab. Bo Mcclintock, RN

## 2015-01-01 NOTE — Progress Notes (Signed)
[facility]  ROUNDING NOTE   Subjective:  Pt seen at bedside. Cr down to 2.5.   Good UOP noted.   Objective:  Vital signs in last 24 hours:  Temp:  [98.1 F (36.7 C)-98.9 F (37.2 C)] 98.1 F (36.7 C) (08/21 0516) Pulse Rate:  [108-109] 109 (08/21 0516) Resp:  [20] 20 (08/21 0516) BP: (120-150)/(57-73) 147/70 mmHg (08/21 0516) SpO2:  [86 %-96 %] 92 % (08/21 1146)  Weight change:  Filed Weights   12/27/14 1448 12/27/14 1925  Weight: 104.327 kg (230 lb) 98.657 kg (217 lb 8 oz)    Intake/Output: I/O last 3 completed shifts: In: 480 [P.O.:480] Out: 2500 [Urine:2500]   Intake/Output this shift:  Total I/O In: 240 [P.O.:240] Out: -   Physical Exam: General: NAD, obese  Head: Normocephalic, atraumatic. Moist oral mucosal membranes  Eyes: Anicteric  Neck: Supple, trachea midline  Lungs:  Scattered rhonchi, normal effort  Heart: Regular rate and rhythm S1S2  Abdomen:  Soft, nontender, BS present  Extremities:  no peripheral edema.  Neurologic: Nonfocal, moving all four extremities  Skin: No lesions       Basic Metabolic Panel:  Recent Labs Lab 12/27/14 1805 12/28/14 0109 12/29/14 0524 12/30/14 0436 12/31/14 0515 01/01/15 0444  NA  --  138 137 135 137 137  K  --  3.7 4.3 4.5 4.1 4.5  CL  --  91* 93* 92* 96* 101  CO2  --  36* 37* 34* 32 31  GLUCOSE  --  443* 316* 299* 331* 260*  BUN  --  35* 49* 76* 97* 90*  CREATININE  --  2.12* 2.05* 2.58* 2.96* 2.53*  CALCIUM  --  8.3* 8.4* 8.5* 8.3* 8.3*  MG 2.0  --   --   --   --   --     Liver Function Tests:  Recent Labs Lab 12/27/14 1454  AST 29  ALT 22  ALKPHOS 96  BILITOT 0.5  PROT 7.4  ALBUMIN 3.7   No results for input(s): LIPASE, AMYLASE in the last 168 hours. No results for input(s): AMMONIA in the last 168 hours.  CBC:  Recent Labs Lab 12/27/14 1454 12/28/14 0109 12/29/14 0524 12/30/14 0436 01/01/15 0444  WBC 10.5 10.5 27.9* 26.0* 16.2*  HGB 9.8* 8.1* 8.0* 8.8* 8.1*   HCT 33.5* 27.8* 27.0* 30.2* 26.9*  MCV 73.0* 73.2* 73.0* 73.3* 72.7*  PLT 312 238 249 272 240    Cardiac Enzymes:  Recent Labs Lab 12/27/14 1454 12/27/14 1805 12/28/14 0108 12/28/14 0503  TROPONINI 0.08* 0.07* 0.07* 0.06*    BNP: Invalid input(s): POCBNP  CBG:  Recent Labs Lab 12/31/14 1119 12/31/14 1618 12/31/14 2107 01/01/15 0704 01/01/15 1134  GLUCAP 240* 294* 376* 229* 142*    Microbiology: Results for orders placed or performed in visit on 10/10/13  Culture, blood (single)     Status: None   Collection Time: 10/10/13  5:19 PM  Result Value Ref Range Status   Micro Text Report   Final       COMMENT                   NO GROWTH AEROBICALLY/ANAEROBICALLY IN 5 DAYS   ANTIBIOTIC  Culture, blood (single)     Status: None   Collection Time: 10/10/13  5:19 PM  Result Value Ref Range Status   Micro Text Report   Final       COMMENT                   NO GROWTH AEROBICALLY/ANAEROBICALLY IN 5 DAYS   ANTIBIOTIC                                                        Coagulation Studies: No results for input(s): LABPROT, INR in the last 72 hours.  Urinalysis: No results for input(s): COLORURINE, LABSPEC, PHURINE, GLUCOSEU, HGBUR, BILIRUBINUR, KETONESUR, PROTEINUR, UROBILINOGEN, NITRITE, LEUKOCYTESUR in the last 72 hours.  Invalid input(s): APPERANCEUR    Imaging: US Renal  12/31/2014   CLINICAL DATA:  Acute on chronic renal failure  EXAM: RENAL / URINARY TRACT ULTRASOUND COMPLETE  COMPARISON:  10/11/2013  FINDINGS: Right Kidney:  Length: 10.2 cm. Echogenicity within normal limits. No mass or hydronephrosis visualized.  Left Kidney:  Length: 12.1 cm. A 2 cm cyst is again seen in the lower pole slightly decreased from the prior exam.  Bladder:  Appears normal for degree of bladder distention.  IMPRESSION: Left renal cyst decreased in size from the prior study.  No other focal abnormality is noted.    Electronically Signed   By: Alcide Clever M.D.   On: 12/31/2014 12:39     Medications:   . sodium chloride 100 mL/hr at 01/01/15 1227   . amLODipine  5 mg Oral Daily  . aspirin  81 mg Oral Daily  . heparin subcutaneous  5,000 Units Subcutaneous 3 times per day  . insulin aspart  0-15 Units Subcutaneous TID WC  . insulin aspart  0-5 Units Subcutaneous QHS  . insulin aspart  8 Units Subcutaneous TID WC  . insulin glargine  43 Units Subcutaneous QHS  . levalbuterol  1.25 mg Nebulization QID  . mometasone-formoterol  2 puff Inhalation BID  . pravastatin  40 mg Oral Daily  . [START ON 01/06/2015] predniSONE  10 mg Oral Q breakfast  . [START ON 01/05/2015] predniSONE  20 mg Oral Q breakfast  . [START ON 01/04/2015] predniSONE  30 mg Oral Q breakfast  . [START ON 01/03/2015] predniSONE  40 mg Oral Q breakfast  . [START ON 01/02/2015] predniSONE  50 mg Oral Q breakfast  . sodium chloride  3 mL Intravenous Q12H  . tiotropium  18 mcg Inhalation Daily  . Vitamin D (Ergocalciferol)  50,000 Units Oral Q7 days   acetaminophen **OR** acetaminophen, albuterol, ondansetron **OR** ondansetron (ZOFRAN) IV  Assessment/ Plan:  Patient is a 69 y.o. female with a PMHx of COPD, chronic respiratory failure, diabetes mellitus, chronic diastolic heart failure, hypertension, CKD stage III, who was admitted to [facility] on 12/27/2014 for evaluation of shortness of breath.  She is a rather poor historian and unable to offer accurate details.  She apparently was admitted with worsening shortness of breath.  O2 sat was in the 60-70s en route here.   Imaging also revealed right lung mass.    1.  Acute renal failure/CKD stage III/proteinuria: Pt admitted with shortness of breath but now having worsening renal function.  Renal US negative for hydronephrosis.  Cause of renal insufficiency unclear at the moment.   -  Renal function improved, Cr down to 2.5, BUN down to 90, continue IVF hydration.  BUN high due to steroids.    2.   Hypertension: BP 147/70 at the moment, continue amlodipine 5mg  po daily.  3.  Anemia of CKD:  hgb down to 8.1, awaiting spep/upep.  LOS: 5 Quinnley Colasurdo 8/21/201612:31 PM

## 2015-01-01 NOTE — Progress Notes (Signed)
Paged Dr. Winona Legato regarding patient wanting to go home. MD stated that if the patient wanted to leave, AMA would be her only option. This was told to the patient and I explained to her the details of leaving AMA. After discussing, the patient decided it would be most beneficial for her to stay and reevaluate in the morning. Bo Mcclintock, RN

## 2015-01-01 NOTE — Plan of Care (Signed)
Problem: Discharge Progression Outcomes Goal: Other Discharge Outcomes/Goals Outcome: Progressing Plan of care progress to goal:  No complaints of pain. Patient remains on 3L-O2, sat 94%. Up to chair with standby assist. 24 hour urine collection continues - to be completed at 1730. Patient stating that she wants to go home.

## 2015-01-01 NOTE — Care Management Note (Signed)
Case Management Note  Patient Details  Name: Rhonda Garner MRN: [id number] Date of Birth: [birth year]  Subjective/Objective:  Left phone message for nephew asking for a return call both yesterday and today but have received no call back. Discussed with Dr Winona Legato. Ms Rhine is no longer an active client of [facility], but we think that Ms Voong has Medicaid sponsored aides who come to her home at unknown intervals.                  Action/Plan:   Expected Discharge Date:                  Expected Discharge Plan:     In-House Referral:     Discharge planning Services     Post Acute Care Choice:    Choice offered to:     DME Arranged:    DME Agency:     HH Arranged:    HH Agency:     Status of Service:     Medicare Important Message Given:  Yes-second notification given Date Medicare IM Given:    Medicare IM give by:    Date Additional Medicare IM Given:    Additional Medicare Important Message give by:     If discussed at Long Length of Stay Meetings, dates discussed:    Additional Comments:  Karlei Waldo A, RN 01/01/2015, 1:48 PM

## 2015-01-01 NOTE — Progress Notes (Signed)
[facility] [place] [facility]   PATIENT NAME: Rhonda Garner    MR#:  [id number]  DATE OF BIRTH:  Apr 04, 1946  SUBJECTIVE:  CHIEF COMPLAINT:   Chief Complaint  Patient presents with  . Respiratory Distress   Patient here due to shortness of breath suspected to be due to COPD exacerbation and noted to have a Lung Mass. CT 8/17 confirmed Lung mass which had grown in size.  Lethargic but easily waking up with verbal stimuli. Try to discuss this patient her wishes in regards to  Lung mass workup, however, she is not willing to do that now. Discussed with care management about patient's discharge planning and family situation. The patient tells me that she has multiple family members were able to cook and care for her, however, no family members was seen while she was here in the hospital. She admits to some cough but no significant sputum production. Agitated intermittently and rips off of her nebulizer mask. Wants to go home. Kidney function is somewhat better today with IV fluid administration. Kidney ultrasound was negative for hydronephrosis.  Discussed this patient and her social situation and it appears that the only people who she sees in her home is a rescue. Her sister's son is in his 35s and lives in [place], who what the be next of kin to make decisions if patient is disabled. Patient's last living sibling , sister is in memory unit skilled nursing facility.  REVIEW OF SYSTEMS:    Review of Systems  Constitutional: Negative for fever and chills.  HENT: Negative for congestion and tinnitus.   Eyes: Negative for blurred vision and double vision.  Respiratory: Positive for cough, shortness of breath and wheezing (end-exp. wheezing. ). Negative for hemoptysis and sputum production.   Cardiovascular: Negative for chest pain, orthopnea and PND.  Gastrointestinal: Negative for nausea, vomiting, abdominal pain and diarrhea.  Genitourinary: Negative for dysuria and  hematuria.  Neurological: Positive for weakness (generalized). Negative for dizziness, sensory change and focal weakness.  All other systems reviewed and are negative.   Nutrition: Heart healthy carb control Tolerating Diet: Yes Tolerating PT: Ambulatory   DRUG ALLERGIES:   Allergies  Allergen Reactions  . Nickel Other (See Comments)  . Simvastatin Other (See Comments)    VITALS:  Blood pressure 147/70, pulse 109, temperature 98.1 F (36.7 C), temperature source Oral, resp. rate 20, height 5\' 4"  (1.626 m), weight 98.657 kg (217 lb 8 oz), SpO2 96 %.  PHYSICAL EXAMINATION:   Physical Exam  GENERAL:  69 year old obese patient lying in the bed in mild respiratory distress, alert today, awake, and agitated intermittently,  demands to go home despite knowledge of kidney function abnormalities EYES: Pupils equal, round, reactive to light and accommodation. No scleral icterus. Extraocular muscles intact.  HEENT: Head atraumatic, normocephalic. Oropharynx and nasopharynx clear.  NECK:  Supple, no jugular venous distention. No thyroid enlargement, no tenderness.  LUNGS: Prolonged inspiratory and expiratory phase. End expiratory wheezing. No rales, rhonchi.  (-) use of accessory muscles.  CARDIOVASCULAR: S1, S2 normal. No murmurs, rubs, or gallops.  ABDOMEN: Soft, nontender, nondistended. Bowel sounds present. No organomegaly or mass.  EXTREMITIES: No cyanosis, clubbing or edema b/l.    NEUROLOGIC: Cranial nerves II through XII are intact. No focal Motor or sensory deficits b/l. Globally weak and lethargic. PSYCHIATRIC: The patient is alert and oriented x 3. Gets upset easily when discussed about her lung mass  evaluation SKIN: No obvious rash, lesion, or ulcer.  LABORATORY PANEL:   CBC  Recent Labs Lab 01/01/15 0444  WBC 16.2*  HGB 8.1*  HCT 26.9*  PLT 240    ------------------------------------------------------------------------------------------------------------------  Chemistries   Recent Labs Lab 12/27/14 1454 12/27/14 1805  01/01/15 0444  NA 147*  --   < > 137  K 3.3*  --   < > 4.5  CL 92*  --   < > 101  CO2 45*  --   < > 31  GLUCOSE 150*  --   < > 260*  BUN 22*  --   < > 90*  CREATININE 1.73*  --   < > 2.53*  CALCIUM 9.2  --   < > 8.3*  MG  --  2.0  --   --   AST 29  --   --   --   ALT 22  --   --   --   ALKPHOS 96  --   --   --   BILITOT 0.5  --   --   --   < > = values in this interval not displayed. ------------------------------------------------------------------------------------------------------------------  Cardiac Enzymes  Recent Labs Lab 12/28/14 0503  TROPONINI 0.06*   ------------------------------------------------------------------------------------------------------------------  RADIOLOGY:  US Renal  12/31/2014   CLINICAL DATA:  Acute on chronic renal failure  EXAM: RENAL / URINARY TRACT ULTRASOUND COMPLETE  COMPARISON:  10/11/2013  FINDINGS: Right Kidney:  Length: 10.2 cm. Echogenicity within normal limits. No mass or hydronephrosis visualized.  Left Kidney:  Length: 12.1 cm. A 2 cm cyst is again seen in the lower pole slightly decreased from the prior exam.  Bladder:  Appears normal for degree of bladder distention.  IMPRESSION: Left renal cyst decreased in size from the prior study.  No other focal abnormality is noted.   Electronically Signed   By: Alcide Clever M.D.   On: 12/31/2014 12:39     ASSESSMENT AND PLAN:   69 year old female with past medical history of COPD, diabetes, hypertension, hyperlipidemia, chronic anemia, chronic kidney disease stage III, who presented to the hospital due to shortness of breath and noted to be in COPD exacerbation.  #1 COPD exacerbation-clinically improving. Continue tapering steroids and slow to improve, but now on 3 L of oxygen through nasal cannula which is  her baseline.  -Continue duo nebs around-the-clock, Dulera, Spiriva. Pt. Already on O2 at home -CT chest 12/28/14 showing increase in the size of lung mass. No evidence of overt pneumonia and not  on antibiotics presently. 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  -Patient initially was quite lethargic, tried on BiPAP, but eventually refused it and now is on 3 L oxygen. Discussed with care management about possible assisted living facility placement, although is unclear if patient will be agreeable for that. Social work was asked to investigate her family situation and patient's living situation.  Discussed this patient today and it appears that she has one living sister who is in the memory unit and other sister's son who lives in the [place] and probably will be making medical decisions if needed.   #3 lung mass-incidentally noted on chest x-ray on admission. -Has CT chest 8/17 which showed increase in size of the lung mass. Appreciate pulmonary/oncology input. Patient presently does not want any workup or intervention. The next step would be a biopsy likely transthoracic and also  a PET scan for staging and this can be done as an outpatient once the patient agrees to it. Would arrange cancer center follow-up as outpatient  #4 suspected PE-ruled out. Dopplers of lower extremities negative for DVT yesterday. Off HEPARIN drip.  #5 type 2 diabetes-blood sugars still uncontrolled due to IV steroids, although we are  tapering steroids. -Hemoglobin A1c is 6.8.    Advance Lantus, NovoLog with meals, sliding scale insulin but will need to advance dose,although we are tapering steroids .  #6 hypertension-continue Norvasc. Good blood pressure control  #7 hyperlipidemia-continue Pravachol.  #8 . Acute on chronic renal failure with a history of CK D  stage III-patient's creatinine down to 2.53 today. Some improvement since yesterday on  IV fluids . She does have a history of chronic kidney disease with a baseline Cr. around 1.7-2.  Appreciate nephrologist input. Ultrasound of kidneys showed no hydronephrosis.  #9 elevated troponin-likely in the setting of demand ischemia from hypoxia and COPD exacerbation. No evidence of acute coronary syndrome or chest pain.   #10 Seen by PT and recommended SNF/STR and discussed w/ CM, care management is to discuss this patient again today, although she refused skilled nursing facility placement in the past.    All the records are reviewed and case discussed with Care Management/Social Workerr. Management plans discussed with the patient, family and they are in agreement.  CODE STATUS: Full  DVT Prophylaxis: Heparin normogram  TOTAL CRITICAL CARE TIME TAKING CARE OF THIS PATIENT: 40 minutes.  Discharge planning was discussed with care management again today POSSIBLE D/C IN 2-3 DAYS, DEPENDING ON CLINICAL CONDITION.   Katharina Caper M.D on 01/01/2015 at 11:15 AM  Between 7am to 6pm - Pager - [phone]  After 6pm go to [email or web address] - password [email or web address] [facility]  [facility]  Office  [phone]  CC: Primary care physician; Sherrie Mustache, MD

## 2015-01-02 DIAGNOSIS — F4322 Adjustment disorder with anxiety: Secondary | ICD-10-CM

## 2015-01-02 LAB — CBC
HCT: 27 % — ABNORMAL LOW (ref 35.0–47.0)
HEMOGLOBIN: 8.1 g/dL — AB (ref 12.0–16.0)
MCH: 21.9 pg — AB (ref 26.0–34.0)
MCHC: 29.9 g/dL — ABNORMAL LOW (ref 32.0–36.0)
MCV: 73.2 fL — ABNORMAL LOW (ref 80.0–100.0)
Platelets: 241 10*3/uL (ref 150–440)
RBC: 3.69 MIL/uL — AB (ref 3.80–5.20)
RDW: 17.7 % — ABNORMAL HIGH (ref 11.5–14.5)
WBC: 14.2 10*3/uL — AB (ref 3.6–11.0)

## 2015-01-02 LAB — PROTEIN ELECTROPHORESIS, SERUM
A/G RATIO SPE: 1.1 (ref 0.7–1.7)
ALPHA-1-GLOBULIN: 0.2 g/dL (ref 0.0–0.4)
ALPHA-2-GLOBULIN: 0.8 g/dL (ref 0.4–1.0)
Albumin ELP: 3 g/dL (ref 2.9–4.4)
Beta Globulin: 1.1 g/dL (ref 0.7–1.3)
GLOBULIN, TOTAL: 2.8 g/dL (ref 2.2–3.9)
Gamma Globulin: 0.7 g/dL (ref 0.4–1.8)
Total Protein ELP: 5.8 g/dL — ABNORMAL LOW (ref 6.0–8.5)

## 2015-01-02 LAB — KAPPA/LAMBDA LIGHT CHAINS
KAPPA FREE LGHT CHN: 23.48 mg/L — AB (ref 3.30–19.40)
Kappa, lambda light chain ratio: 1.26 (ref 0.26–1.65)
LAMDA FREE LIGHT CHAINS: 18.62 mg/L (ref 5.71–26.30)

## 2015-01-02 LAB — MPO/PR-3 (ANCA) ANTIBODIES
ANCA Proteinase 3: <3.5 U/mL (ref 0.0–3.5)
Myeloperoxidase Abs: <9 U/mL (ref 0.0–9.0)

## 2015-01-02 LAB — GLUCOSE, CAPILLARY
GLUCOSE-CAPILLARY: 179 mg/dL — AB (ref 65–99)
GLUCOSE-CAPILLARY: 180 mg/dL — AB (ref 65–99)
Glucose-Capillary: 245 mg/dL — ABNORMAL HIGH (ref 65–99)
Glucose-Capillary: 158 mg/dL — ABNORMAL HIGH (ref 65–99)

## 2015-01-02 LAB — PARATHYROID HORMONE, INTACT (NO CA): PTH: 75 pg/mL — ABNORMAL HIGH (ref 15–65)

## 2015-01-02 LAB — CREATININE, SERUM
Creatinine, Ser: 2.34 mg/dL — ABNORMAL HIGH (ref 0.44–1.00)
GFR calc Af Amer: 24 mL/min — ABNORMAL LOW (ref >60)
GFR calc non Af Amer: 20 mL/min — ABNORMAL LOW (ref >60)

## 2015-01-02 LAB — GLOMERULAR BASEMENT MEMBRANE ANTIBODIES: GBM AB: 3 U (ref 0–20)

## 2015-01-02 NOTE — Plan of Care (Signed)
Problem: Discharge Progression Outcomes Goal: Other Discharge Outcomes/Goals Outcome: Progressing Plan of care progress to goal:  No complaints of pain. IV fluids discontinued. Patient remains on 3L-O2, sat 93%. Psych consulted. SW and CM involved in plan of care.

## 2015-01-02 NOTE — Progress Notes (Signed)
Initial Nutrition Assessment   INTERVENTION:   Meals and Snacks: Cater to patient preferences   NUTRITION DIAGNOSIS:   No nutrition diagnosis at this time  GOAL:   Patient will meet greater than or equal to 90% of their needs  MONITOR:    (Energy Intake, Pulmonary Profile)  REASON FOR ASSESSMENT:   LOS    ASSESSMENT:   Pt admitted with SOB and COPD exacerbation. Pt with lung mass identified on 8/17, no workup currently.  Past Medical History  Diagnosis Date  . COPD (chronic obstructive pulmonary disease)   . Chronic respiratory failure   . Diabetes mellitus without complication   . Chronic diastolic heart failure   . Hypertension   . 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  Food/Nutrition-Related History: Recorded po intake 100% of meals since admission.   Medications: Novolog, Lantus, Prednisone, vitamin D  Electrolyte/Renal Profile and Glucose Profile:   Recent Labs Lab 12/27/14 1805  12/30/14 0436 12/31/14 0515 01/01/15 0444 01/02/15 0447  NA  --   < > 135 137 137  --   K  --   < > 4.5 4.1 4.5  --   CL  --   < > 92* 96* 101  --   CO2  --   < > 34* 32 31  --   BUN  --   < > 76* 97* 90*  --   CREATININE  --   < > 2.58* 2.96* 2.53* 2.34*  CALCIUM  --   < > 8.5* 8.3* 8.3*  --   MG 2.0  --   --   --   --   --   GLUCOSE  --   < > 299* 331* 260*  --   < > = values in this interval not displayed. Protein Profile:  Recent Labs Lab 12/27/14 1454  ALBUMIN 3.7    Gastrointestinal Profile: Last BM: 01/01/2015   Weight Change: Per [facility] weight gain in the past 2 years   Skin:  Reviewed, no issues   Height:   Ht Readings from Last 1 Encounters:  12/27/14  (1.626 m)    Weight:   Wt Readings  from Last 1 Encounters:  12/27/14 217 lb 8 oz (98.657 kg)    Wt Readings from Last 10 Encounters:  12/27/14 217 lb 8 oz (98.657 kg)  01/26/13 204 lb 12 oz (92.874 kg)    BMI:  Body mass index is 37.32 kg/(m^2).   EDUCATION NEEDS:   No education needs identified at this time   LOW Care Level  Leda Quail, [place], LDN Pager [phone]

## 2015-01-02 NOTE — Progress Notes (Signed)
PT Cancellation Note  Patient Details Name: Ramiah Helfrich MRN: [id number] DOB: 1945/05/22   Cancelled Treatment:    Reason Eval/Treat Not Completed: Fatigue/lethargy limiting ability to participate (Treatment session attempted; patient politely refusing despite encouragement from therapist.  Will re-attempt at later time/date as medically appropriate) Of note, during chart review, noted that patient's initial PT orders had been completed; new order now placed. No change in status, treatment plan noted to warrant change in order or formal re-evaluation at this time.  New order acknowledged; initial POC remains appropriate.   Hanzel Pizzo H. Manson Passey, PT, DPT, NCS 01/02/2015, 9:20 AM [phone]

## 2015-01-02 NOTE — Care Management (Signed)
With patient's permission spoke with nephew Latrelle Dodrill and updated him on the status of patient.  Patient has personal care services through [facility].  She receives services Mondays 4 hours, Tuesdays 3 hours, Wednesdays 2 hours and Thursdays 2 hours.  Hedi Adams from [facility] notified. Patient's nephew has arranged for someone to come to the home 7 days a week to provide breakfast and lunch.  Nephew states that the use [employer] for transportation to and from appointments.  Nephew states that "robo line" calls each day and is a service that calls to ensure that the patient is safe.  Nephew states that when patient is medically ready for discharge that he feels that the home is a safe environment for discharge.

## 2015-01-02 NOTE — Consult Note (Signed)
[facility] Face-to-Face Psychiatry Consult   Reason for Consult:  Consultation for this 69 year old woman with respiratory failure COPD and a long mass. Consult for capacity to make decisions. Referring Physician:  Manuella Ghazi Patient Identification: Rhonda Garner MRN:  [id number] Principal Diagnosis: Acute on chronic respiratory failure Diagnosis:   Patient Active Problem List   Diagnosis Date Noted  . Acute on chronic respiratory failure [J96.20] 12/27/2014  . COPD exacerbation [J44.1] 12/27/2014  . Lung mass [R91.8] 12/27/2014  . Hypokalemia [E87.6] 12/27/2014  . Chronic diastolic heart failure [A19.37]   . Hypertension [I10]     Total Time spent with patient: 1 hour  Subjective:   Rhonda Garner is a 69 y.o. female patient admitted with "I wish they would just leave me alone about it".  HPI:  Information from the patient and the chart. 69 year old woman came into the hospital with a worsening of her breathing. Patient is aware that she has COPD and says that had been getting worse recently for which she blames the current heat outdoors. Patient says that her mood feels nervous and bit but denies feeling depressed. Sleep is intermittent but chronically so. Appetite normal. Totally denies suicidal or homicidal ideation. Denies any hallucinations or psychotic symptoms. Patient was questioned about her understanding of her medical problems. She understands that she has COPD and other medical problems. She also understands that she has been told repeatedly that she has a long mass. She states to me that she does not want to have any biopsy done and wants to just go home and does not want to go to rehabilitation. The more I press for her to answer questions about these things the more she evasive she becomes. She clearly gets very anxious when I'm pressing her to discuss the topics.  Past psychiatric history: No known past psychiatric history. No history of psychiatric hospitalizations suicide attempt violence  or psychiatric medication.  Social history: Patient lives alone. Has a home health caregiver who comes in a couple hours a day although it sounds like recently she's had some disputes with that person. Says that normally when she is feeling medically okay she can drive herself to do her errands recently she has had a somebody to help her.  Medical history: Chronic COPD. Uses oxygen at home. Hyper tension. Hypokalemia. Lung mass which is yet to be worked up because of her lack of cooperation. Chronic heart failure.  Substance abuse history: Denies any alcohol or drug abuse. Used to smoke but has now given it up.  Family history: Denies any family history of mental health problems. HPI Elements:   Quality:  anxiety and avoidance. Severity:  severe in the degree to which they may be life-threatening. Timing:  chronic and recurrent. Duration:  still ongoing. Context:  social isolation.  Past Medical History:  Past Medical History  Diagnosis Date  . COPD (chronic obstructive pulmonary disease)   . Chronic respiratory failure   . Diabetes mellitus without complication   . Chronic diastolic heart failure   . Hypertension   . 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 Cholecystectomy     Family History:  Family History  Problem Relation Age of Onset  . Hypertension Mother   . Hypertension Father    Social History:  History  Alcohol Use No     History  Drug Use No    Social History  Social History  . Marital Status: Single    Spouse Name: N/A  . Number of Children: N/A  . Years of Education: N/A   Social History Main Topics  . Smoking status: Former Smoker -- 3.00 packs/day for 20 years  . Smokeless tobacco: None  . Alcohol Use: No  . Drug Use: No  . Sexual Activity: Not Asked   Other Topics Concern  . None   Social History Narrative   Additional  Social History:                          Allergies:   Allergies  Allergen Reactions  . Nickel Other (See Comments)  . Simvastatin Other (See Comments)    Labs:  Results for orders placed or performed during the hospital encounter of 12/27/14 (from the past 48 hour(s))  Glucose, capillary     Status: Abnormal   Collection Time: 12/31/14  9:07 PM  Result Value Ref Range   Glucose-Capillary 376 (H) 65 - 99 mg/dL   Comment 1 Notify RN   Basic metabolic panel     Status: Abnormal   Collection Time: 01/01/15  4:44 AM  Result Value Ref Range   Sodium 137 135 - 145 mmol/L   Potassium 4.5 3.5 - 5.1 mmol/L   Chloride 101 101 - 111 mmol/L   CO2 31 22 - 32 mmol/L   Glucose, Bld 260 (H) 65 - 99 mg/dL   BUN 90 (H) 6 - 20 mg/dL   Creatinine, Ser 2.53 (H) 0.44 - 1.00 mg/dL   Calcium 8.3 (L) 8.9 - 10.3 mg/dL   GFR calc non Af Amer 19 (L) >60 mL/min   GFR calc Af Amer 21 (L) >60 mL/min    Comment: (NOTE) The eGFR has been calculated using the CKD EPI equation. This calculation has not been validated in all clinical situations. eGFR's persistently <60 mL/min signify possible Chronic Kidney Disease.    Anion gap 5 5 - 15  CBC     Status: Abnormal   Collection Time: 01/01/15  4:44 AM  Result Value Ref Range   WBC 16.2 (H) 3.6 - 11.0 K/uL   RBC 3.70 (L) 3.80 - 5.20 MIL/uL   Hemoglobin 8.1 (L) 12.0 - 16.0 g/dL   HCT 26.9 (L) 35.0 - 47.0 %   MCV 72.7 (L) 80.0 - 100.0 fL   MCH 21.8 (L) 26.0 - 34.0 pg   MCHC 29.9 (L) 32.0 - 36.0 g/dL   RDW 17.7 (H) 11.5 - 14.5 %   Platelets 240 150 - 440 K/uL  Glucose, capillary     Status: Abnormal   Collection Time: 01/01/15  7:04 AM  Result Value Ref Range   Glucose-Capillary 229 (H) 65 - 99 mg/dL  Glucose, capillary     Status: Abnormal   Collection Time: 01/01/15 11:34 AM  Result Value Ref Range   Glucose-Capillary 142 (H) 65 - 99 mg/dL  Glucose, capillary     Status: Abnormal   Collection Time: 01/01/15  5:00 PM  Result Value Ref  Range   Glucose-Capillary 240 (H) 65 - 99 mg/dL  Glucose, capillary     Status: Abnormal   Collection Time: 01/01/15  9:16 PM  Result Value Ref Range   Glucose-Capillary 350 (H) 65 - 99 mg/dL  Creatinine, serum     Status: Abnormal   Collection Time: 01/02/15  4:47 AM  Result Value Ref Range   Creatinine, Ser 2.34 (H) 0.44 - 1.00  mg/dL   GFR calc non Af Amer 20 (L) >60 mL/min   GFR calc Af Amer 24 (L) >60 mL/min    Comment: (NOTE) The eGFR has been calculated using the CKD EPI equation. This calculation has not been validated in all clinical situations. eGFR's persistently <60 mL/min signify possible Chronic Kidney Disease.   CBC     Status: Abnormal   Collection Time: 01/02/15  4:47 AM  Result Value Ref Range   WBC 14.2 (H) 3.6 - 11.0 K/uL   RBC 3.69 (L) 3.80 - 5.20 MIL/uL   Hemoglobin 8.1 (L) 12.0 - 16.0 g/dL   HCT 27.0 (L) 35.0 - 47.0 %   MCV 73.2 (L) 80.0 - 100.0 fL   MCH 21.9 (L) 26.0 - 34.0 pg   MCHC 29.9 (L) 32.0 - 36.0 g/dL   RDW 17.7 (H) 11.5 - 14.5 %   Platelets 241 150 - 440 K/uL  Glucose, capillary     Status: Abnormal   Collection Time: 01/02/15  7:18 AM  Result Value Ref Range   Glucose-Capillary 245 (H) 65 - 99 mg/dL  Glucose, capillary     Status: Abnormal   Collection Time: 01/02/15 11:18 AM  Result Value Ref Range   Glucose-Capillary 179 (H) 65 - 99 mg/dL  Glucose, capillary     Status: Abnormal   Collection Time: 01/02/15  4:23 PM  Result Value Ref Range   Glucose-Capillary 180 (H) 65 - 99 mg/dL    Vitals: Blood pressure 152/75, pulse 111, temperature 98.2 F (36.8 C), temperature source Oral, resp. rate 20, height 5' 4" (1.626 m), weight 98.657 kg (217 lb 8 oz), SpO2 93 %.  Risk to Self: Is patient at risk for suicide?: No Risk to Others:   Prior Inpatient Therapy:   Prior Outpatient Therapy:    Current Facility-Administered Medications  Medication Dose Route Frequency Provider Last Rate Last Dose  . acetaminophen (TYLENOL) tablet 650 mg  650  mg Oral Q6H PRN Demetrios Loll, MD   650 mg at 12/30/14 2225   Or  . acetaminophen (TYLENOL) suppository 650 mg  650 mg Rectal Q6H PRN Demetrios Loll, MD      . albuterol (PROVENTIL) (2.5 MG/3ML) 0.083% nebulizer solution 2.5 mg  2.5 mg Nebulization Q2H PRN Demetrios Loll, MD   2.5 mg at 01/02/15 0400  . amLODipine (NORVASC) tablet 5 mg  5 mg Oral Daily Henreitta Leber, MD   5 mg at 01/02/15 0841  . aspirin chewable tablet 81 mg  81 mg Oral Daily Demetrios Loll, MD   81 mg at 01/02/15 0841  . heparin injection 5,000 Units  5,000 Units Subcutaneous 3 times per day Henreitta Leber, MD   5,000 Units at 01/02/15 1305  . insulin aspart (novoLOG) injection 0-15 Units  0-15 Units Subcutaneous TID WC Henreitta Leber, MD   3 Units at 01/02/15 1706  . insulin aspart (novoLOG) injection 0-5 Units  0-5 Units Subcutaneous QHS Henreitta Leber, MD   4 Units at 01/01/15 2120  . insulin aspart (novoLOG) injection 8 Units  8 Units Subcutaneous TID WC Henreitta Leber, MD   8 Units at 01/02/15 1707  . insulin glargine (LANTUS) injection 43 Units  43 Units Subcutaneous QHS Theodoro Grist, MD   43 Units at 01/01/15 2120  . levalbuterol (XOPENEX) nebulizer solution 1.25 mg  1.25 mg Nebulization QID Henreitta Leber, MD   1.25 mg at 01/02/15 1539  . mometasone-formoterol (DULERA) 200-5 MCG/ACT inhaler 2 puff  2 puff Inhalation BID Henreitta Leber, MD   2 puff at 01/02/15 [phone]  . ondansetron (ZOFRAN) tablet 4 mg  4 mg Oral Q6H PRN Demetrios Loll, MD       Or  . ondansetron [facility]) injection 4 mg  4 mg Intravenous Q6H PRN Demetrios Loll, MD      . pravastatin (PRAVACHOL) tablet 40 mg  40 mg Oral Daily Henreitta Leber, MD   40 mg at 01/02/15 0841  . [START ON 01/06/2015] predniSONE (DELTASONE) tablet 10 mg  10 mg Oral Q breakfast Theodoro Grist, MD      . Derrill Memo ON 01/05/2015] predniSONE (DELTASONE) tablet 20 mg  20 mg Oral Q breakfast Theodoro Grist, MD      . Derrill Memo ON 01/04/2015] predniSONE (DELTASONE) tablet 30 mg  30 mg Oral Q breakfast Theodoro Grist, MD       . Derrill Memo ON 01/03/2015] predniSONE (DELTASONE) tablet 40 mg  40 mg Oral Q breakfast Theodoro Grist, MD      . sodium chloride 0.9 % injection 3 mL  3 mL Intravenous Q12H Demetrios Loll, MD   3 mL at 12/30/14 0858  . tiotropium (SPIRIVA) inhalation capsule 18 mcg  18 mcg Inhalation Daily Henreitta Leber, MD   18 mcg at 01/02/15 [phone]  . Vitamin D (Ergocalciferol) (DRISDOL) capsule 50,000 Units  50,000 Units Oral Q7 days Henreitta Leber, MD   50,000 Units at 12/28/14 1219    Musculoskeletal: Strength & Muscle Tone: within normal limits Gait & Station: unsteady Patient leans: N/A  Psychiatric Specialty Exam: Physical Exam  Nursing note and vitals reviewed. Constitutional: She appears well-developed and well-nourished.  HENT:  Head: Normocephalic and atraumatic.  Eyes: Conjunctivae are normal. Pupils are equal, round, and reactive to light.  Neck: Normal range of motion.  Cardiovascular: Normal heart sounds.   Respiratory: Effort normal.  GI: Soft.  Musculoskeletal: Normal range of motion.  Neurological: She is alert.  Skin: Skin is warm and dry.  Psychiatric: Her speech is normal. Thought content normal. Her mood appears anxious. She is withdrawn. Cognition and memory are normal. She expresses inappropriate judgment. She exhibits normal recent memory.    Review of Systems  Constitutional: Negative.   HENT: Negative.   Eyes: Negative.   Respiratory: Negative.   Cardiovascular: Negative.   Gastrointestinal: Negative.   Musculoskeletal: Negative.   Skin: Negative.   Neurological: Negative.   Psychiatric/Behavioral: Negative for depression, suicidal ideas, hallucinations, memory loss and substance abuse. The patient is nervous/anxious. The patient does not have insomnia.     Blood pressure 152/75, pulse 111, temperature 98.2 F (36.8 C), temperature source Oral, resp. rate 20, height 5' 4" (1.626 m), weight 98.657 kg (217 lb 8 oz), SpO2 93 %.Body mass index is 37.32 kg/(m^2).  General  Appearance: Casual  Eye Contact::  Fair  Speech:  Clear and Coherent  Volume:  Decreased  Mood:  Anxious  Affect:  Blunt  Thought Process:  Circumstantial  Orientation:  Full (Time, Place, and Person)  Thought Content:  Negative  Suicidal Thoughts:  No  Homicidal Thoughts:  No  Memory:  Immediate;   Good Recent;   Fair Remote;   Fair  Judgement:  Impaired  Insight:  Shallow  Psychomotor Activity:  Decreased  Concentration:  Fair  Recall:  [place]  Language: Good  Akathisia:  No  Handed:  Right  AIMS (if indicated):     Assets:  Agricultural consultant Housing  ADL's:  Impaired  Cognition: WNL  Sleep:      Medical Decision Making: New problem, with additional work up planned, Review or order clinical lab tests (1), Review or order medicine tests (1) and Review of Medication Regimen & Side Effects (2)  Treatment Plan Summary: Plan question was regarding capacity. This appears to be particularly related to her refusal to allow workup of her lung mass and her declining referral to rehabilitation. The usual standard for capacity is the patient understanding the condition under discussion, understanding the options that are being offered and having some understanding of the pros and cons involved. In this case I do not think that she has very significant dementia but I do think that she is so anxious that she is uncomfortable to an extreme degree in discussing her medical problems. It seems fairly clear to me that she is frightened about the outcome of her lung mass and about the possibility of never being able to go home again. However, she is unwilling to discuss these things. The more detailed I tried to get in discussing her condition the more avoidant she got. Eventually she turned on the television and started paying attention to the soap operas and almost completely ignoring me. I explained to the patient the idea of capacity and the  fact that if an adult does not have the ability to prove that they can understand her medical condition that it becomes incumbent on the treatment providers to take care of them as though they were incompetent. Patient showed some understanding of it but still would not give me a clear understanding of her rationale for her decisions. I think it seems pretty clear that she understands that lung cancer as a possibility and that lung cancer can be fatal but that she is preferring to want to avoid this. My guess reading between the lines would be that she does understand the condition at hand and understand what is being offered and to understand the pros and cons of treatment but is simply not wanting to even discuss it. Unfortunately this means that we can't really prove it and are stuck with being able to conclude her capacity. In short I think she probably does have capacity but she is so emotionally avoidant that she can't demonstrated. I will come back and speak with her again tomorrow. No indication for any psychiatric treatment at this point.  Plan:  Patient does not meet criteria for psychiatric inpatient admission. Supportive therapy provided about ongoing stressors. Disposition: continue monitoring and follow-up in the hospital  [place] 01/02/2015 6:30 PM

## 2015-01-02 NOTE — Progress Notes (Signed)
[facility] - [place] at [facility]   PATIENT NAME: Rhonda Garner    MR#:  [id number]  DATE OF BIRTH:  Oct 06, 1945  SUBJECTIVE:  CHIEF COMPLAINT:   Chief Complaint  Patient presents with  . Respiratory Distress  Patient here due to shortness of breath suspected to be due to COPD exacerbation and noted to have a Lung Mass. CT 8/17 confirmed Lung mass which had grown in size.  Lethargic but easily waking up with verbal stimuli. Try to discuss this patient her wishes in regards to  Lung mass workup, however, she is not willing to do that now. Discussed with care management about patient's discharge planning and family situation. The patient tells me that she has multiple family members were able to cook and care for her, however, no family members was seen while she was here in the hospital. She admits to some cough but no significant sputum production. Agitated intermittently and rips off of her nebulizer mask. Wants to go home. Kidney function is somewhat better today with IV fluid administration. Kidney ultrasound was negative for hydronephrosis. Discussed this patient and her social situation and it appears that the only people who she sees in her home is a rescue. Her sister's son is in his 15s and lives in [place], who what the be next of kin to make decisions if patient is disabled. Patient's last living sibling , sister is in memory unit skilled nursing facility.  Patient does not want to discuss about her lung mass which is possibly cancer.  She is sitting in the chair comfortably wanting to go home, although there has been concerns about safe disposition from care management standpoint whether she is making competent decisions. REVIEW OF SYSTEMS:    Review of Systems  Constitutional: Negative for fever and chills.  HENT: Negative for congestion and tinnitus.   Eyes: Negative for blurred vision and double vision.  Respiratory: Positive for cough, shortness of breath  and wheezing (end-exp. wheezing. ). Negative for hemoptysis and sputum production.   Cardiovascular: Negative for chest pain, orthopnea and PND.  Gastrointestinal: Negative for nausea, vomiting, abdominal pain and diarrhea.  Genitourinary: Negative for dysuria and hematuria.  Neurological: Positive for weakness (generalized). Negative for dizziness, sensory change and focal weakness.  All other systems reviewed and are negative.   Nutrition: Heart healthy carb control Tolerating Diet: Yes Tolerating PT: Ambulatory DRUG ALLERGIES:   Allergies  Allergen Reactions  . Nickel Other (See Comments)  . Simvastatin Other (See Comments)   VITALS:  Blood pressure 141/87, pulse 120, temperature 98 F (36.7 C), temperature source Oral, resp. rate 20, height  (1.626 m), weight 98.657 kg (217 lb 8 oz), SpO2 93 %. PHYSICAL EXAMINATION:  Physical Exam  GENERAL:  69 y.o.-year-old obese patient lying in the bed in mild respiratory distress, alert today, awake, and agitated intermittently,  demands to go home despite knowledge of kidney function abnormalities EYES: Pupils equal, round, reactive to light and accommodation. No scleral icterus. Extraocular muscles intact.  HEENT: Head atraumatic, normocephalic. Oropharynx and nasopharynx clear.  NECK:  Supple, no jugular venous distention. No thyroid enlargement, no tenderness.  LUNGS: Prolonged inspiratory and expiratory phase. End expiratory wheezing. No rales, rhonchi.  (-) use of accessory muscles.  CARDIOVASCULAR: S1, S2 normal. No murmurs, rubs, or gallops.  ABDOMEN: Soft, nontender, nondistended. Bowel sounds present. No organomegaly or mass.  EXTREMITIES: No cyanosis, clubbing or edema b/l.    NEUROLOGIC: Cranial nerves II through XII are intact. No  focal Motor or sensory deficits b/l. Globally weak and lethargic. PSYCHIATRIC: The patient is alert and oriented x 3. Gets upset easily when discussed about her lung mass  evaluation SKIN: No  obvious rash, lesion, or ulcer.  LABORATORY PANEL:   CBC  Recent Labs Lab 01/02/15 0447  WBC 14.2*  HGB 8.1*  HCT 27.0*  PLT 241   ------------------------------------------------------------------------------------------------------------------  Chemistries   Recent Labs Lab 12/27/14 1454 12/27/14 1805  01/01/15 0444 01/02/15 0447  NA 147*  --   < > 137  --   K 3.3*  --   < > 4.5  --   CL 92*  --   < > 101  --   CO2 45*  --   < > 31  --   GLUCOSE 150*  --   < > 260*  --   BUN 22*  --   < > 90*  --   CREATININE 1.73*  --   < > 2.53* 2.34*  CALCIUM 9.2  --   < > 8.3*  --   MG  --  2.0  --   --   --   AST 29  --   --   --   --   ALT 22  --   --   --   --   ALKPHOS 96  --   --   --   --   BILITOT 0.5  --   --   --   --   < > = values in this interval not displayed. ------------------------------------------------------------------------------------------------------------------  Cardiac Enzymes  Recent Labs Lab 12/28/14 0503  TROPONINI 0.06*   ------------------------------------------------------------------------------------------------------------------  RADIOLOGY:  US Renal  12/31/2014   CLINICAL DATA:  Acute on chronic renal failure  EXAM: RENAL / URINARY TRACT ULTRASOUND COMPLETE  COMPARISON:  10/11/2013  FINDINGS: Right Kidney:  Length: 10.2 cm. Echogenicity within normal limits. No mass or hydronephrosis visualized.  Left Kidney:  Length: 12.1 cm. A 2 cm cyst is again seen in the lower pole slightly decreased from the prior exam.  Bladder:  Appears normal for degree of bladder distention.  IMPRESSION: Left renal cyst decreased in size from the prior study.  No other focal abnormality is noted.   Electronically Signed   By: Alcide Clever M.D.   On: 12/31/2014 12:39   ASSESSMENT AND PLAN:   69 year old female with past medical history of COPD, diabetes, hypertension, hyperlipidemia, chronic anemia, chronic kidney disease stage III, who presented to the hospital  due to shortness of breath and noted to be in COPD exacerbation.  #1 COPD exacerbation-clinically improving. Continue tapering steroids and slow to improve, but now on 3 L of oxygen through nasal cannula which is her baseline.  -Continue duo nebs around-the-clock, Dulera, Spiriva. Pt. Already on O2 at home -CT chest 12/28/14 showing increase in the size of lung mass. No evidence of overt pneumonia and not  on antibiotics presently. 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  -Patient initially was quite lethargic, tried on BiPAP, but eventually refused it and now is on 3 L oxygen. Discussed with care management about possible assisted living facility placement, although is unclear if patient will be agreeable for that. Social work was asked to investigate her family situation and patient's living situation.  Discussed this patient today and it appears that she  has one living sister who is in the memory unit and other sister's son who lives in the [place] and probably will be making medical decisions if needed.   #3 Lung mass-incidentally noted on chest x-ray on admission. -Has CT chest 8/17 which showed increase in size of the lung mass. Appreciate pulmonary/oncology input. Patient presently does not want any workup or intervention. The next step would be a biopsy likely transthoracic and also a PET scan for staging and this can be done as an outpatient once the patient agrees to it. Would arrange cancer center follow-up as outpatient  #4 Suspected PE-ruled out. Dopplers of lower extremities negative for DVT yesterday. Off HEPARIN drip.  #5 Type 2 diabetes-blood sugars still uncontrolled due to IV steroids, although we are  tapering steroids. -Hemoglobin A1c is 6.8.    Advance Lantus, NovoLog with meals, sliding scale insulin but will need to  advance dose,although we are tapering steroids . Consult diabetic nurse coordinator  #6 hypertension-continue Norvasc. Good blood pressure control  #7 hyperlipidemia-continue Pravachol.  #8 . Acute on chronic renal failure with a history of CKD stage III-patient's creatinine down to 2.34 (2.53) today. Some improvement since yesterday on IV fluids . She does have a history of chronic kidney disease with a baseline Cr. around 1.7-2.  Appreciate nephrologist input. Ultrasound of kidneys showed no hydronephrosis.  #9 elevated troponin-likely in the setting of demand ischemia from hypoxia and COPD exacerbation. No evidence of acute coronary syndrome or chest pain.   #10 Seen by PT and recommended SNF/STR and discussed w/ CM, care management is to discuss this patient again today, although she refused skilled nursing facility placement in the past. Care management and social worker are concerned about safe disposition.  Recommend psychiatry consult for decision-making capacity.  All the records are reviewed and case discussed with Care Management/Social Worker. Management plans discussed with the patient and she is in agreement.  CODE STATUS: Full   TOTAL CRITICAL CARE TIME TAKING CARE OF THIS PATIENT: 40 minutes  Discharge planning was discussed with care management/social worker again today  POSSIBLE D/C IN 1-2 DAYS, DEPENDING ON CLINICAL CONDITION.   [facility], Jarrod Mcenery M.D on 01/02/2015 at 9:33 AM  Between 7am to 6pm - Pager - [phone]  After 6pm go to [email or web address] - password [email or web address] [facility]  [facility]  Office  [phone]  CC: Primary care physician; Sherrie Mustache, MD

## 2015-01-02 NOTE — Progress Notes (Signed)
Inpatient Diabetes Program Recommendations  AACE/ADA: New Consensus Statement on Inpatient Glycemic Control (2013)  Target Ranges:  Prepandial:   less than 140 mg/dL      Peak postprandial:   less than 180 mg/dL (1-2 hours)      Critically ill patients:  140 - 180 mg/dL   Reason for Visit:  Referral received.  Saw patient and attempted to discuss previous diabetes history and medications.  Patient was sitting up in chair however very sleepy and answers questions with one word.  I was unable to engage her in conversation regarding diabetes and insulin.  It does not appear that she was on insulin at home.  A1C=6.8% which indicates well-controlled CBG's prior to hospitalization.  Patient has been on IV steroids and now transitioning to PO taper of Prednisone.    Hopefully as steroids are tapered, CBG's will improve and patient will not need insulin.  If insulin is necessary and ordered at discharge, this patient will need lots of support with administration and likely will not need once steroids are stopped.  Discussed with RN.  Thanks, Beryl Meager, RN, BC-ADM Inpatient Diabetes Coordinator Pager [phone] (8a-5p)

## 2015-01-02 NOTE — Care Management (Signed)
Patient admitted with Acute on chronic respiratory failure with hypoxia and hypercapnia.  Patient is very groggy during assessment and partially answering question.  Patient states that she lives at home alone with her dog.  Patient uses [facility] in [place] to obtain her medications.  Patient states that she has a walker at home to assist with ambulation.  Per report patient has a chronic O2 use of 3-5 liters at home.  Patient is unable to tell me which company provides her O2.  I confirmed with [facility] that they do not provide her O2.  Patient also states there is a nurse that comes out to the house, but is unable to tell me which agency provides services.  Lyn from case management spoke with [facility], they have had the patient but services are not currently open.  I spoke with [facility] and the last time they were open to the patient was in 2014.  I have left a message with Jennell Corner the [facility] representative to see if the patient has a Medicaid CM assigned to her.  Patient is adamant that she will return home, and is not interested in facility placement.  Patient provided me with her nephews contact information Marina Goodell [phone].  With patient's permission I left a message for Marina Goodell to obtain more information regarding patient's living situation.  PT consult pending, will follow for recommendations. CSW following.  RN CM to follow for discharge planning

## 2015-01-02 NOTE — Progress Notes (Signed)
   01/02/15 1025  Clinical Encounter Type  Visited With Patient not available  Attempted to visit with patient, but patient was sleeping.  Will try to return later.  [employer] Abshir Paolini-pager [phone]

## 2015-01-02 NOTE — Progress Notes (Signed)
Subjective:  Pt seen at bedside. Cr down to 2.34.   Good UOP noted.  2000 cc Patient is sitting up in a chair. Very sleepy but arousable and answering questions   Objective:  Vital signs in last 24 hours:  Temp:  [97.7 F (36.5 C)-98 F (36.7 C)] 98 F (36.7 C) (08/22 0455) Pulse Rate:  [109-122] 120 (08/22 0455) Resp:  [18-20] 20 (08/22 0455) BP: (141-153)/(62-87) 141/87 mmHg (08/22 0455) SpO2:  [90 %-94 %] 93 % (08/22 0753)  Weight change:  Filed Weights   12/27/14 1448 12/27/14 1925  Weight: 104.327 kg (230 lb) 98.657 kg (217 lb 8 oz)    Intake/Output: I/O last 3 completed shifts: In: 2911.7 [P.O.:720; I.V.:2191.7] Out: 3550 [Urine:3550]   Intake/Output this shift:  Total I/O In: 240 [P.O.:240] Out: 300 [Urine:300]  Physical Exam: General: NAD, obese  Head: Normocephalic, atraumatic. Moist oral mucosal membranes  Eyes: Anicteric  Neck: Supple, trachea midline  Lungs:  Scattered rhonchi, normal effort, basilar crackles   Heart: Regular rate and rhythm S1S2  Abdomen:  Soft, nontender, BS present  Extremities:  + peripheral edema.  Neurologic: Nonfocal, moving all four extremities  Skin: No lesions       Basic Metabolic Panel:  Recent Labs Lab 12/27/14 1805 12/28/14 0109 12/29/14 0524 12/30/14 0436 12/31/14 0515 01/01/15 0444 01/02/15 0447  NA  --  138 137 135 137 137  --   K  --  3.7 4.3 4.5 4.1 4.5  --   CL  --  91* 93* 92* 96* 101  --   CO2  --  36* 37* 34* 32 31  --   GLUCOSE  --  443* 316* 299* 331* 260*  --   BUN  --  35* 49* 76* 97* 90*  --   CREATININE  --  2.12* 2.05* 2.58* 2.96* 2.53* 2.34*  CALCIUM  --  8.3* 8.4* 8.5* 8.3* 8.3*  --   MG 2.0  --   --   --   --   --   --     Liver Function Tests:  Recent Labs Lab 12/27/14 1454  AST 29  ALT 22  ALKPHOS 96  BILITOT 0.5  PROT 7.4  ALBUMIN 3.7   No results for input(s): LIPASE, AMYLASE in the last 168 hours. No results for input(s): AMMONIA in the last 168  hours.  CBC:  Recent Labs Lab 12/28/14 0109 12/29/14 0524 12/30/14 0436 01/01/15 0444 01/02/15 0447  WBC 10.5 27.9* 26.0* 16.2* 14.2*  HGB 8.1* 8.0* 8.8* 8.1* 8.1*  HCT 27.8* 27.0* 30.2* 26.9* 27.0*  MCV 73.2* 73.0* 73.3* 72.7* 73.2*  PLT 238 249 272 240 241    Cardiac Enzymes:  Recent Labs Lab 12/27/14 1454 12/27/14 1805 12/28/14 0108 12/28/14 0503  TROPONINI 0.08* 0.07* 0.07* 0.06*    BNP: Invalid input(s): POCBNP  CBG:  Recent Labs Lab 01/01/15 0704 01/01/15 1134 01/01/15 1700 01/01/15 2116 01/02/15 0718  GLUCAP 229* 142* 240* 350* 245*    Microbiology: Results for orders placed or performed in visit on 10/10/13  Culture, blood (single)     Status: None   Collection Time: 10/10/13  5:19 PM  Result Value Ref Range Status   Micro Text Report   Final       COMMENT                   NO GROWTH AEROBICALLY/ANAEROBICALLY IN 5 DAYS   ANTIBIOTIC  Culture, blood (single)     Status: None   Collection Time: 10/10/13  5:19 PM  Result Value Ref Range Status   Micro Text Report   Final       COMMENT                   NO GROWTH AEROBICALLY/ANAEROBICALLY IN 5 DAYS   ANTIBIOTIC                                                        Coagulation Studies: No results for input(s): LABPROT, INR in the last 72 hours.  Urinalysis: No results for input(s): COLORURINE, LABSPEC, PHURINE, GLUCOSEU, HGBUR, BILIRUBINUR, KETONESUR, PROTEINUR, UROBILINOGEN, NITRITE, LEUKOCYTESUR in the last 72 hours.  Invalid input(s): APPERANCEUR    Imaging: US Renal  12/31/2014   CLINICAL DATA:  Acute on chronic renal failure  EXAM: RENAL / URINARY TRACT ULTRASOUND COMPLETE  COMPARISON:  10/11/2013  FINDINGS: Right Kidney:  Length: 10.2 cm. Echogenicity within normal limits. No mass or hydronephrosis visualized.  Left Kidney:  Length: 12.1 cm. A 2 cm cyst is again seen in the lower pole slightly decreased from the prior exam.   Bladder:  Appears normal for degree of bladder distention.  IMPRESSION: Left renal cyst decreased in size from the prior study.  No other focal abnormality is noted.   Electronically Signed   By: Alcide Clever M.D.   On: 12/31/2014 12:39     Medications:   . sodium chloride 100 mL/hr at 01/02/15 0847   . amLODipine  5 mg Oral Daily  . aspirin  81 mg Oral Daily  . heparin subcutaneous  5,000 Units Subcutaneous 3 times per day  . insulin aspart  0-15 Units Subcutaneous TID WC  . insulin aspart  0-5 Units Subcutaneous QHS  . insulin aspart  8 Units Subcutaneous TID WC  . insulin glargine  43 Units Subcutaneous QHS  . levalbuterol  1.25 mg Nebulization QID  . mometasone-formoterol  2 puff Inhalation BID  . pravastatin  40 mg Oral Daily  . [START ON 01/06/2015] predniSONE  10 mg Oral Q breakfast  . [START ON 01/05/2015] predniSONE  20 mg Oral Q breakfast  . [START ON 01/04/2015] predniSONE  30 mg Oral Q breakfast  . [START ON 01/03/2015] predniSONE  40 mg Oral Q breakfast  . sodium chloride  3 mL Intravenous Q12H  . tiotropium  18 mcg Inhalation Daily  . Vitamin D (Ergocalciferol)  50,000 Units Oral Q7 days   acetaminophen **OR** acetaminophen, albuterol, ondansetron **OR** ondansetron (ZOFRAN) IV  Assessment/ Plan:  Patient is a 69 y.o. female with a PMHx of COPD, chronic respiratory failure, diabetes mellitus, chronic diastolic heart failure, hypertension, CKD stage III, who was admitted to [facility] on 12/27/2014 for evaluation of shortness of breath.  She is a rather poor historian and unable to offer accurate details.  She apparently was admitted with worsening shortness of breath.  O2 sat was in the 60-70s en route here.   Imaging also revealed right lung mass.    1.  Acute renal failure/CKD stage III/proteinuria: Pt admitted with shortness of breath but now having worsening renal function.  Renal US negative for hydronephrosis.  Cause of renal insufficiency unclear at the moment.   -Renal  function improved, Cr down to 2.34,  BUN  high due to steroids.    2.  Edema- we'll discontinue further IV fluid administration  3.  Anemia of CKD: UPEP neg   LOS: 6 Rhonda Garner 8/22/201610:46 AM

## 2015-01-02 NOTE — Progress Notes (Signed)
   01/02/15 1400  Clinical Encounter Type  Visited With Patient  Visit Type Initial  Spiritual Encounters  Spiritual Needs Prayer  Stress Factors  Patient Stress Factors None identified  Chaplain rounded in unit and offered prayer to patient.   [employer] [phone]

## 2015-01-02 NOTE — Care Management Important Message (Signed)
Important Message  Patient Details  Name: Rhonda Garner MRN: [id number] Date of Birth: 24-Apr-1946   Medicare Important Message Given:  Yes-third notification given    Verita Schneiders Allmond 01/02/2015, 1:43 PM

## 2015-01-03 LAB — CBC
HCT: 28 % — ABNORMAL LOW (ref 35.0–47.0)
HEMOGLOBIN: 8.4 g/dL — AB (ref 12.0–16.0)
MCH: 22.1 pg — AB (ref 26.0–34.0)
MCHC: 29.8 g/dL — ABNORMAL LOW (ref 32.0–36.0)
MCV: 73.9 fL — AB (ref 80.0–100.0)
PLATELETS: 258 10*3/uL (ref 150–440)
RBC: 3.79 MIL/uL — AB (ref 3.80–5.20)
RDW: 17.4 % — ABNORMAL HIGH (ref 11.5–14.5)
WBC: 16.1 10*3/uL — AB (ref 3.6–11.0)

## 2015-01-03 LAB — GLUCOSE, CAPILLARY
Glucose-Capillary: 152 mg/dL — ABNORMAL HIGH (ref 65–99)
Glucose-Capillary: 158 mg/dL — ABNORMAL HIGH (ref 65–99)

## 2015-01-03 LAB — BASIC METABOLIC PANEL
ANION GAP: 5 (ref 5–15)
BUN: 82 mg/dL — AB (ref 6–20)
CHLORIDE: 109 mmol/L (ref 101–111)
CO2: 29 mmol/L (ref 22–32)
Calcium: 8.6 mg/dL — ABNORMAL LOW (ref 8.9–10.3)
Creatinine, Ser: 2.18 mg/dL — ABNORMAL HIGH (ref 0.44–1.00)
GFR calc Af Amer: 26 mL/min — ABNORMAL LOW (ref >60)
GFR calc non Af Amer: 22 mL/min — ABNORMAL LOW (ref >60)
Glucose, Bld: 194 mg/dL — ABNORMAL HIGH (ref 65–99)
POTASSIUM: 5 mmol/L (ref 3.5–5.1)
SODIUM: 143 mmol/L (ref 135–145)

## 2015-01-03 MED ORDER — MOMETASONE FURO-FORMOTEROL FUM 200-5 MCG/ACT IN AERO: 2.0000 | INHALATION_SPRAY | Freq: Two times a day (BID) | RESPIRATORY_TRACT | Status: AC

## 2015-01-03 NOTE — Discharge Instructions (Signed)
Chronic Obstructive Pulmonary Disease Chronic obstructive pulmonary disease (COPD) is a common lung problem. In COPD, the flow of air from the lungs is limited. The way your lungs work will probably never return to normal, but there are things you can do to improve your lungs and make yourself feel better. HOME CARE  Take all medicines as told by your doctor.  Avoid medicines or cough syrups that dry up your airway (such as antihistamines) and do not allow you to get rid of thick spit. You do not need to avoid them if told differently by your doctor.  If you smoke, stop. Smoking makes the problem worse.  Avoid being around things that make your breathing worse (like smoke, chemicals, and fumes).  Use oxygen therapy and therapy to help improve your lungs (pulmonary rehabilitation) if told by your doctor. If you need home oxygen therapy, ask your doctor if you should buy a tool to measure your oxygen level (oximeter).  Avoid people who have a sickness you can catch (contagious).  Avoid going outside when it is very hot, cold, or humid.  Eat healthy foods. Eat smaller meals more often. Rest before meals.  Stay active, but remember to also rest.  Make sure to get all the shots (vaccines) your doctor recommends. Ask your doctor if you need a pneumonia shot.  Learn and use tips on how to relax.  Learn and use tips on how to control your breathing as told by your doctor. Try:  Breathing in (inhaling) through your nose for 1 second. Then, pucker your lips and breath out (exhale) through your lips for 2 seconds.  Putting one hand on your belly (abdomen). Breathe in slowly through your nose for 1 second. Your hand on your belly should move out. Pucker your lips and breathe out slowly through your lips. Your hand on your belly should move in as you breathe out.  Learn and use controlled coughing to clear thick spit from your lungs. The steps are: 1. Lean your head a little forward. 2. Breathe  in deeply. 3. Try to hold your breath for 3 seconds. 4. Keep your mouth slightly open while coughing 2 times. 5. Spit any thick spit out into a tissue. 6. Rest and do the steps again 1 or 2 times as needed. GET HELP IF:  You cough up more thick spit than usual.  There is a change in the color or thickness of the spit.  It is harder to breathe than usual.  Your breathing is faster than usual. GET HELP RIGHT AWAY IF:   You have shortness of breath while resting.  You have shortness of breath that stops you from:  Being able to talk.  Doing normal activities.  You chest hurts for longer than 5 minutes.  Your skin color is more blue than usual.  Your pulse oximeter shows that you have low oxygen for longer than 5 minutes. MAKE SURE YOU:   Understand these instructions.  Will watch your condition.  Will get help right away if you are not doing well or get worse. Document Released: 10/16/2007 Document Revised: 09/13/2013 Document Reviewed: 12/24/2012 [facility] Patient Information 2015 [place], [place]. This information is not intended to replace advice given to you by your health care provider. Make sure you discuss any questions you have with your health care provider.   Pulmonary Nodule  A pulmonary nodule is a small, round spot in your lung. It is usually found when pictures of your lungs are taken  for other reasons. Most pulmonary nodules are not cancerous and do not cause symptoms. Tests will be done to make sure the nodule is not cancerous. Pulmonary nodules that are not cancerous usually do not require treatment. HOME CARE   Only take medicine as told by your doctor.  Follow up with your doctor as told. GET HELP IF: 7. You have trouble breathing when doing activities. 8. You feel sick or more tired than normal. 9. You do not feel like eating. 10. You lose weight without trying to. 11. You have chills. 12. You have night sweats. GET HELP RIGHT AWAY IF:  You  cannot catch your breath.  You start making whistling sounds when breathing (wheezing).  You have a cough that does not go away.  You cough up blood.  You are dizzy or feel like you are going to pass out.  You have sudden chest pain.  You have a fever or lasting symptoms for more than 2-3 days.  You have a fever and your symptoms suddenly get worse. MAKE SURE YOU:  Understand these instructions.  Will watch your condition.  Will get help right away if you are not doing well or get worse. Document Released: 06/01/2010 Document Revised: 12/30/2012 Document Reviewed: 10/19/2012 [facility] Patient Information 2015 [place], [place]. This information is not intended to replace advice given to you by your health care provider. Make sure you discuss any questions you have with your health care provider.

## 2015-01-03 NOTE — Progress Notes (Signed)
New referral for [facility] received from [place]. Rhonda Garner is 69 year old woman who was admitted to [facility] on 8/16 with acute respiratory distress secondary to COPD exacerbation with questionable PE, she was initially started on a heparin drip that was discontinued as clinical suspicion was low. Chest  CT with out contrast done on 8/17 showed "worsening mass like opacity at the right lung base. Cannot exclude lung cancer". Patient unable to have contrast CT secondary to poor kidney function. She has a past medical history of: COPD, chronic diastolic heart failure, DM II, HLD and HTN.  Per discussion with [facility] Colletta [place] and chart note review, patient has declined further work up of mass and during this hospitalization has  did not wanted to discuss treatment options. She has been evaluated by phsychiatry and felt to be able to make her own health care decisions.  PT has recommended SNF placement for rehab, patient has declined and wants to go home. She has a nephew Shelton Silvas, who lives in [place] and who has arranged 7 day a week assistance with meals. She is also receiving services from [facility] a few hours 4 days a week. Attending physician Dr. Manuella Ghazi has ordered Home health nursing and Physical therapy to follow patient at her home. Writer met with Ms. Orland Mustard, [facility] information given. She is open to services. She currently has oxygen in place in her home as well as 2 wheeled rolling walker. No DME needs identified at this time.She is  Full Code. Information faxed to referral intake. [facility] brochure given.  Plan is for discharge today via EMS. [facility] Colletta [place] and attending physician Dr. Manuella Ghazi aware of need for home health orders and F2F. Thank you for the opportunity to participate in the care of [name].  Flo Shanks RN, BSN, [facility] Liaison [phone] c

## 2015-01-03 NOTE — Care Management (Signed)
Per Dr. Sherryll Burger patient will discharge today with home health nursing and PT.  MD to  Place order and complete face to face.  Patient offered list of Home health agencies.  [facility] selected.  Dayna Barker of [facility] notified.  I will notify Hedi from [facility] of patients discharge.

## 2015-01-03 NOTE — Progress Notes (Signed)
Inpatient Diabetes Program Recommendations  AACE/ADA: New Consensus Statement on Inpatient Glycemic Control (2013)  Target Ranges:  Prepandial:   less than 140 mg/dL      Peak postprandial:   less than 180 mg/dL (1-2 hours)      Critically ill patients:  140 - 180 mg/dL   Reason for review: elevated cbg  Diabetes history: type 2 Outpatient Diabetes medications: Glipizide  bid, A1C 6.8% on 12/28/14  Current orders for Inpatient glycemic control: Lantus 43 units qhs, Novolog 8 units tid with meals, Novolog correction 0-15 units tid with meals and Novolog correction 0-5 units qhs  Consider decreasing Lantus insulin to 30 units (0.3 units/kg) qhs now that steroids are decreasing.  Will follow- will likely need to wean Novolog mealtime insulin as well.  Susette Racer, RN, BA, MHA, CDE Diabetes Coordinator Inpatient Diabetes Program  [phone] (Team Pager) [phone] [facility] Office) 01/03/2015 8:58 AM

## 2015-01-03 NOTE — Plan of Care (Signed)
Problem: Discharge Progression Outcomes Goal: Other Discharge Outcomes/Goals Outcome: Progressing Pt is alert and oriented, forgetful at times. No c/o pain. 1 assist to the bedside commode. Remains on 3 L 02, VSS.

## 2015-01-03 NOTE — Progress Notes (Signed)
Patient is medically stable for D/C home today. Per RN case manager patient requires EMS home. Patient's address has been confirmed by [facility] Liaison. Clinical Child psychotherapist (CSW) prepared EMS packet. RN aware of above. Please reconsult if future social work needs arise. CSW signing off.   Jetta Lout, LCSWA [phone]

## 2015-01-03 NOTE — Progress Notes (Signed)
Inpatient Diabetes Program Recommendations  AACE/ADA: New Consensus Statement on Inpatient Glycemic Control (2013)  Target Ranges:  Prepandial:   less than 140 mg/dL      Peak postprandial:   less than 180 mg/dL (1-2 hours)      Critically ill patients:  140 - 180 mg/dL   Reason for Visit: consult-d/c home  Spoke with patient at the bedside- discussed the importance of taking her medications as ordered and the need for blood sugar testing.  She tells me she checks sugar 3-4 times per day- strongly encouraged to bring her meter with her when she sees her MD.   Susette Racer, RN, BA, MHA, CDE Diabetes Coordinator Inpatient Diabetes Program  [phone] (Team Pager) [phone] [facility] Office) 01/03/2015 12:31 PM

## 2015-01-03 NOTE — Progress Notes (Signed)
Subjective:  Pt seen at bedside. Overall doing. Slight further improvement in serum creatinine Urine output 2100 cc No shortness of breath, nausea and vomiting   Objective:  Vital signs in last 24 hours:  Temp:  [98.2 F (36.8 C)-98.7 F (37.1 C)] 98.6 F (37 C) (08/23 0543) Pulse Rate:  [102-111] 102 (08/23 0543) Resp:  [18-20] 20 (08/23 0543) BP: (152-154)/(68-76) 153/76 mmHg (08/23 0543) SpO2:  [93 %-95 %] 94 % (08/23 0810)  Weight change:  Filed Weights   12/27/14 1448 12/27/14 1925  Weight: 104.327 kg (230 lb) 98.657 kg (217 lb 8 oz)    Intake/Output: I/O last 3 completed shifts: In: 2205 [P.O.:720; I.V.:1485] Out: 3400 [Urine:3400]   Intake/Output this shift:  Total I/O In: 240 [P.O.:240] Out: 1000 [Urine:1000]  Physical Exam: General: NAD, obese  Head: Normocephalic, atraumatic. Moist oral mucosal membranes  Eyes: Anicteric  Neck: Supple, trachea midline  Lungs:  Scattered rhonchi, normal effort, basilar crackles   Heart: Regular rate and rhythm S1S2  Abdomen:  Soft, nontender, BS present  Extremities:  + peripheral edema.  Neurologic: Nonfocal, moving all four extremities  Skin: No lesions       Basic Metabolic Panel:  Recent Labs Lab 12/27/14 1805  12/29/14 0524 12/30/14 0436 12/31/14 0515 01/01/15 0444 01/02/15 0447 01/03/15 0428  NA  --   < > 137 135 137 137  --  143  K  --   < > 4.3 4.5 4.1 4.5  --  5.0  CL  --   < > 93* 92* 96* 101  --  109  CO2  --   < > 37* 34* 32 31  --  29  GLUCOSE  --   < > 316* 299* 331* 260*  --  194*  BUN  --   < > 49* 76* 97* 90*  --  82*  CREATININE  --   < > 2.05* 2.58* 2.96* 2.53* 2.34* 2.18*  CALCIUM  --   < > 8.4* 8.5* 8.3* 8.3*  --  8.6*  MG 2.0  --   --   --   --   --   --   --   < > = values in this interval not displayed.  Liver Function Tests:  Recent Labs Lab 12/27/14 1454  AST 29  ALT 22  ALKPHOS 96  BILITOT 0.5  PROT 7.4  ALBUMIN 3.7   No results for input(s): LIPASE, AMYLASE in  the last 168 hours. No results for input(s): AMMONIA in the last 168 hours.  CBC:  Recent Labs Lab 12/29/14 0524 12/30/14 0436 01/01/15 0444 01/02/15 0447 01/03/15 0428  WBC 27.9* 26.0* 16.2* 14.2* 16.1*  HGB 8.0* 8.8* 8.1* 8.1* 8.4*  HCT 27.0* 30.2* 26.9* 27.0* 28.0*  MCV 73.0* 73.3* 72.7* 73.2* 73.9*  PLT 249 272 240 241 258    Cardiac Enzymes:  Recent Labs Lab 12/27/14 1454 12/27/14 1805 12/28/14 0108 12/28/14 0503  TROPONINI 0.08* 0.07* 0.07* 0.06*    BNP: Invalid input(s): POCBNP  CBG:  Recent Labs Lab 01/02/15 0718 01/02/15 1118 01/02/15 1623 01/02/15 2201 01/03/15 0701  GLUCAP 245* 179* 180* 158* 152*    Microbiology: Results for orders placed or performed in visit on 10/10/13  Culture, blood (single)     Status: None   Collection Time: 10/10/13  5:19 PM  Result Value Ref Range Status   Micro Text Report   Final       COMMENT  NO GROWTH AEROBICALLY/ANAEROBICALLY IN 5 DAYS   ANTIBIOTIC                                                      Culture, blood (single)     Status: None   Collection Time: 10/10/13  5:19 PM  Result Value Ref Range Status   Micro Text Report   Final       COMMENT                   NO GROWTH AEROBICALLY/ANAEROBICALLY IN 5 DAYS   ANTIBIOTIC                                                        Coagulation Studies: No results for input(s): LABPROT, INR in the last 72 hours.  Urinalysis: No results for input(s): COLORURINE, LABSPEC, PHURINE, GLUCOSEU, HGBUR, BILIRUBINUR, KETONESUR, PROTEINUR, UROBILINOGEN, NITRITE, LEUKOCYTESUR in the last 72 hours.  Invalid input(s): APPERANCEUR    Imaging: No results found.   Medications:     . amLODipine  5 mg Oral Daily  . aspirin  81 mg Oral Daily  . heparin subcutaneous  5,000 Units Subcutaneous 3 times per day  . insulin aspart  0-15 Units Subcutaneous TID WC  . insulin aspart  0-5 Units Subcutaneous QHS  . insulin aspart  8 Units Subcutaneous  TID WC  . insulin glargine  43 Units Subcutaneous QHS  . levalbuterol  1.25 mg Nebulization QID  . mometasone-formoterol  2 puff Inhalation BID  . pravastatin  40 mg Oral Daily  . [START ON 01/06/2015] predniSONE  10 mg Oral Q breakfast  . [START ON 01/05/2015] predniSONE  20 mg Oral Q breakfast  . [START ON 01/04/2015] predniSONE  30 mg Oral Q breakfast  . sodium chloride  3 mL Intravenous Q12H  . tiotropium  18 mcg Inhalation Daily  . Vitamin D (Ergocalciferol)  50,000 Units Oral Q7 days   acetaminophen **OR** acetaminophen, albuterol, ondansetron **OR** ondansetron (ZOFRAN) IV  Assessment/ Plan:  Patient is a 69 y.o. female with a PMHx of COPD, chronic respiratory failure, diabetes mellitus, chronic diastolic heart failure, hypertension, CKD stage III, who was admitted to [facility] on 12/27/2014 for evaluation of shortness of breath.  She is a rather poor historian and unable to offer accurate details.  She apparently was admitted with worsening shortness of breath.  O2 sat was in the 60-70s en route here.   Imaging also revealed right lung mass.    1.  Acute renal failure/CKD stage III/proteinuria: Pt admitted with shortness of breath but now having worsening renal function.  Renal US negative for hydronephrosis.  Cause of renal insufficiency unclear at the moment.   -Renal function improved, Cr down to 2.18,  BUN high due to steroids.  - f/u outpatient   2.  Edema- we'll discontinue further IV fluid administration  3.  Anemia of CKD: UPEP neg   LOS: 7 Rhonda Garner 8/23/201611:06 AM

## 2015-01-05 NOTE — Discharge Summary (Signed)
[facility] [place] at [facility]   PATIENT NAME: Rhonda Garner    MR#:  [id number]  DATE OF BIRTH:  [birth year]  DATE OF ADMISSION:  12/27/2014 ADMITTING PHYSICIAN: Shaune Pollack, MD  DATE OF DISCHARGE: 01/03/2015  4:25 PM  PRIMARY CARE PHYSICIAN: Sherrie Mustache, MD    ADMISSION DIAGNOSIS:  Lung mass [R91.8] Acute exacerbation of chronic obstructive pulmonary disease (COPD) [J44.1] Metabolic alkalosis with respiratory acidosis [E87.4] Acute on chronic respiratory failure with hypoxemia [J96.21]  DISCHARGE DIAGNOSIS:  Principal Problem:   Acute on chronic respiratory failure Active Problems:   COPD exacerbation   Lung mass   Hypokalemia   Adjustment disorder with anxious mood   SECONDARY DIAGNOSIS:   Past Medical History  Diagnosis Date  . COPD (chronic obstructive pulmonary disease)   . Chronic respiratory failure   . Diabetes mellitus without complication   . Chronic diastolic heart failure   . Hypertension   . 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 female with a known history of chronic respiratory failure on home oxygen 3-5 L, COPD, hypertension, diabetes, CK D stage III and chronic diastolic CHF was admitted for acute on chronic hypoxic respiratory failure due to COPD exacerbation.  Please see Dr. Nicky Pugh dictated history and physical for further details. Patient was treated with IV steroids, duo nebs around-the-clock, Dulera, Spiriva, and was responding well.  She also required trial of BiPAP due to some lethargy and she responded.  As a part of her ongoing respiratory failure workup and to rule out pulmonary embolism, She underwent CT scan of the chest which showed incidental lung mass concerning for cancer which oncology consultation was obtained with Dr. [name].   Patient was not willing to discuss anything about her lung mass.  Neither she wanted to do  any workup.  Initially there was some concern that she is not making competent issues and for his psychiatry consultation was obtained who did not feel that patient is incompetent to make decision.  It was felt that the patient just does not want discussed this at this time.he should not was evaluated by physical therapy and was recommended rehabilitation patient also refused that and just wanted to go home saying that she has enough help.  Patient was also evaluated by pulmonary, Dr. Meredeth Ide.  Considering her COPD and expanding right lower lobe lung mass with 1.1 cm subcarinal mass which was worrisome for bronchogenic carcinoma. she was offered.  PET scan along with transthoracic needle biopsy and for further workup of her possible lung cancer.  She continued to refuse everything  Patient was also evaluated by nephrology for acute on chronic kidney disease stage III with proteinuria. She responded well to hydration and her renal function was improved and was close to baseline.  She was also noted to have anemia of chronic kidney disease.  She was adamant in wanting to go home and finally on 23rd of August, she was stable enough to be discharged.  Patient was agreeable with all the discharge plans and was discharged home in stable condition. DISCHARGE CONDITIONS:  stable CONSULTS OBTAINED:  Treatment Team:  Mertie Moores, MD Johney Maine, MD [place] Cherylann Ratel, MD Audery Amel, MD DRUG ALLERGIES:   Allergies  Allergen Reactions  . Nickel Other (See Comments)  . Simvastatin Other (See Comments)   DISCHARGE MEDICATIONS:   Discharge Medication List as of 01/03/2015 11:16 AM  START taking these medications   Details  mometasone-formoterol (DULERA) 200-5 MCG/ACT AERO Inhale 2 puffs into the lungs 2 (two) times daily., Starting 01/03/2015, Until Discontinued, Normal      CONTINUE these medications which have NOT CHANGED   Details  amLODipine (NORVASC) 5 MG tablet Take 5 mg by mouth  daily., Until Discontinued, Historical Med    aspirin 81 MG tablet Take 81 mg by mouth daily., Until Discontinued, Historical Med    COMBIVENT RESPIMAT 20-100 MCG/ACT AERS respimat 1 puff 4 (four) times daily. , Starting 12/30/2012, Until Discontinued, Historical Med    ferrous sulfate 325 (65 FE) MG tablet Take 325 mg by mouth 3 (three) times daily with meals., Until Discontinued, Historical Med    furosemide (LASIX) 40 MG tablet Take 40 mg by mouth. Takes 2 tablets am and 1 tablet pm daily., Until Discontinued, Historical Med    glipiZIDE (GLUCOTROL) 10 MG tablet Take 10 mg by mouth 2 (two) times daily before a meal., Until Discontinued, Historical Med    pravastatin (PRAVACHOL) 40 MG tablet Take 40 mg by mouth daily., Until Discontinued, Historical Med    quinapril (ACCUPRIL) 40 MG tablet Take 40 mg by mouth 2 (two) times daily. , Until Discontinued, Historical Med    Vitamin D, Ergocalciferol, (DRISDOL) 50000 UNITS CAPS capsule Take 50,000 Units by mouth every 7 (seven) days., Until Discontinued, Historical Med    acetaminophen (TYLENOL) 325 MG tablet Take 325 mg by mouth every 4 (four) hours as needed for pain., Until Discontinued, Historical Med       DISCHARGE INSTRUCTIONS:   DIET:  Cardiac diet  DISCHARGE CONDITION:  Good  ACTIVITY:  Activity as tolerated  OXYGEN:  Home Oxygen: No.   Oxygen Delivery: room air  DISCHARGE LOCATION:  home   If you experience worsening of your admission symptoms, develop shortness of breath, life threatening emergency, suicidal or homicidal thoughts you must seek medical attention immediately by calling 911 or calling your MD immediately  if symptoms less severe.  You Must read complete instructions/literature along with all the possible adverse reactions/side effects for all the Medicines you take and that have been prescribed to you. Take any new Medicines after you have completely understood and accpet all the possible adverse  reactions/side effects.   Please note  You were cared for by a hospitalist during your hospital stay. If you have any questions about your discharge medications or the care you received while you were in the hospital after you are discharged, you can call the unit and asked to speak with the hospitalist on call if the hospitalist that took care of you is not available. Once you are discharged, your primary care physician will handle any further medical issues. Please note that NO REFILLS for any discharge medications will be authorized once you are discharged, as it is imperative that you return to your primary care physician (or establish a relationship with a primary care physician if you do not have one) for your aftercare needs so that they can reassess your need for medications and monitor your lab values.    On the day of Discharge:   VITAL SIGNS:  Blood pressure 153/69, pulse 109, temperature 97.9 F (36.6 C), temperature source Oral, resp. rate 20, height 5\' 4"  (1.626 m), weight 98.657 kg (217 lb 8 oz), SpO2 93 %.  I/O:  No intake or output data in the 24 hours ending 01/05/15 1524  PHYSICAL EXAMINATION:  GENERAL:  69 y.o.-year-old patient lying in  the bed with no acute distress.  EYES: Pupils equal, round, reactive to light and accommodation. No scleral icterus. Extraocular muscles intact.  HEENT: Head atraumatic, normocephalic. Oropharynx and nasopharynx clear.  NECK:  Supple, no jugular venous distention. No thyroid enlargement, no tenderness.  LUNGS: Normal breath sounds bilaterally, no wheezing, rales,rhonchi or crepitation. No use of accessory muscles of respiration.  CARDIOVASCULAR: S1, S2 normal. No murmurs, rubs, or gallops.  ABDOMEN: Soft, non-tender, non-distended. Bowel sounds present. No organomegaly or mass.  EXTREMITIES: No pedal edema, cyanosis, or clubbing.  NEUROLOGIC: Cranial nerves II through XII are intact. Muscle strength 5/5 in all extremities. Sensation  intact. Gait not checked.  PSYCHIATRIC: The patient is alert and oriented x 3.  SKIN: No obvious rash, lesion, or ulcer.   DATA REVIEW:   CBC  Recent Labs Lab 01/03/15 0428  WBC 16.1*  HGB 8.4*  HCT 28.0*  PLT 258    Chemistries   Recent Labs Lab 01/03/15 0428  NA 143  K 5.0  CL 109  CO2 29  GLUCOSE 194*  BUN 82*  CREATININE 2.18*  CALCIUM 8.6*    Cardiac Enzymes No results for input(s): TROPONINI in the last 168 hours.  Microbiology Results  Results for orders placed or performed in visit on 10/10/13  Culture, blood (single)     Status: None   Collection Time: 10/10/13  5:19 PM  Result Value Ref Range Status   Micro Text Report   Final       COMMENT                   NO GROWTH AEROBICALLY/ANAEROBICALLY IN 5 DAYS   ANTIBIOTIC                                                      Culture, blood (single)     Status: None   Collection Time: 10/10/13  5:19 PM  Result Value Ref Range Status   Micro Text Report   Final       COMMENT                   NO GROWTH AEROBICALLY/ANAEROBICALLY IN 5 DAYS   ANTIBIOTIC                                                        Management plans discussed with the patient, family and they are in agreement.  CODE STATUS: full code  TOTAL TIME TAKING CARE OF THIS PATIENT: 55 minutes.    [facility], [place] Freyre M.D on 01/05/2015 at 3:24 PM  Between 7am to 6pm - Pager - [phone]  After 6pm go to [email or web address] - password [email or web address] [facility]  [place] [facility]  Office  [phone]  CC: Primary care physician; Sherrie Mustache, MD  Mertie Moores, MD Johney Maine, MD [place] Cherylann Ratel, MD Audery Amel, MD

## 2015-01-10 LAB — UIFE/LIGHT CHAINS/TP QN, 24-HR UR
FREE LT CHN EXCR RATE: 112 mg/L — AB (ref 1.35–24.19)
Free Kappa/Lambda Ratio: 34.04 — ABNORMAL HIGH (ref 2.04–10.37)
Free Lambda Lt Chains,Ur: 3.29 mg/L (ref 0.24–6.66)
TOTAL PROTEIN, URINE-UPE24: 5.6 mg/dL

## 2015-01-24 ENCOUNTER — Ambulatory Visit: Payer: Medicare Other | Admitting: Oncology

## 2015-01-25 ENCOUNTER — Inpatient Hospital Stay: Payer: Medicare Other

## 2015-01-25 ENCOUNTER — Inpatient Hospital Stay: Payer: Medicare Other | Attending: Oncology | Admitting: Oncology

## 2015-01-25 VITALS — BP 124/71 | HR 97 | Temp 98.2°F | Wt 225.0 lb

## 2015-01-25 DIAGNOSIS — M810 Age-related osteoporosis without current pathological fracture: Secondary | ICD-10-CM | POA: Insufficient documentation

## 2015-01-25 DIAGNOSIS — E119 Type 2 diabetes mellitus without complications: Secondary | ICD-10-CM | POA: Diagnosis not present

## 2015-01-25 DIAGNOSIS — Z7982 Long term (current) use of aspirin: Secondary | ICD-10-CM | POA: Diagnosis not present

## 2015-01-25 DIAGNOSIS — Z8719 Personal history of other diseases of the digestive system: Secondary | ICD-10-CM | POA: Diagnosis not present

## 2015-01-25 DIAGNOSIS — R918 Other nonspecific abnormal finding of lung field: Secondary | ICD-10-CM | POA: Diagnosis not present

## 2015-01-25 DIAGNOSIS — Z9981 Dependence on supplemental oxygen: Secondary | ICD-10-CM

## 2015-01-25 DIAGNOSIS — I129 Hypertensive chronic kidney disease with stage 1 through stage 4 chronic kidney disease, or unspecified chronic kidney disease: Secondary | ICD-10-CM | POA: Insufficient documentation

## 2015-01-25 DIAGNOSIS — D509 Iron deficiency anemia, unspecified: Secondary | ICD-10-CM | POA: Diagnosis not present

## 2015-01-25 DIAGNOSIS — M1990 Unspecified osteoarthritis, unspecified site: Secondary | ICD-10-CM | POA: Insufficient documentation

## 2015-01-25 DIAGNOSIS — E669 Obesity, unspecified: Secondary | ICD-10-CM | POA: Insufficient documentation

## 2015-01-25 DIAGNOSIS — J449 Chronic obstructive pulmonary disease, unspecified: Secondary | ICD-10-CM | POA: Diagnosis not present

## 2015-01-25 DIAGNOSIS — N183 Chronic kidney disease, stage 3 (moderate): Secondary | ICD-10-CM | POA: Diagnosis not present

## 2015-01-25 DIAGNOSIS — Z79899 Other long term (current) drug therapy: Secondary | ICD-10-CM | POA: Insufficient documentation

## 2015-01-25 DIAGNOSIS — Z87891 Personal history of nicotine dependence: Secondary | ICD-10-CM | POA: Insufficient documentation

## 2015-01-25 DIAGNOSIS — N189 Chronic kidney disease, unspecified: Secondary | ICD-10-CM | POA: Insufficient documentation

## 2015-01-25 DIAGNOSIS — I509 Heart failure, unspecified: Secondary | ICD-10-CM | POA: Insufficient documentation

## 2015-01-25 LAB — COMPREHENSIVE METABOLIC PANEL
ALK PHOS: 90 U/L (ref 38–126)
ALT: 16 U/L (ref 14–54)
AST: 19 U/L (ref 15–41)
Albumin: 3.1 g/dL — ABNORMAL LOW (ref 3.5–5.0)
Anion gap: 8 (ref 5–15)
BUN: 27 mg/dL — ABNORMAL HIGH (ref 6–20)
CALCIUM: 8.4 mg/dL — AB (ref 8.9–10.3)
CO2: 43 mmol/L — AB (ref 22–32)
CREATININE: 2.05 mg/dL — AB (ref 0.44–1.00)
Chloride: 91 mmol/L — ABNORMAL LOW (ref 101–111)
GFR, EST AFRICAN AMERICAN: 28 mL/min — AB (ref >60)
GFR, EST NON AFRICAN AMERICAN: 24 mL/min — AB (ref >60)
Glucose, Bld: 318 mg/dL — ABNORMAL HIGH (ref 65–99)
Potassium: 3.3 mmol/L — ABNORMAL LOW (ref 3.5–5.1)
Sodium: 142 mmol/L (ref 135–145)
Total Bilirubin: 0.5 mg/dL (ref 0.3–1.2)
Total Protein: 6.3 g/dL — ABNORMAL LOW (ref 6.5–8.1)

## 2015-01-25 LAB — CBC WITH DIFFERENTIAL/PLATELET
Basophils Absolute: 0 10*3/uL (ref 0–0.1)
Basophils Relative: 0 %
Eosinophils Absolute: 0.4 10*3/uL (ref 0–0.7)
Eosinophils Relative: 6 %
HCT: 29.1 % — ABNORMAL LOW (ref 35.0–47.0)
HEMOGLOBIN: 8.8 g/dL — AB (ref 12.0–16.0)
LYMPHS ABS: 0.8 10*3/uL — AB (ref 1.0–3.6)
LYMPHS PCT: 11 %
MCH: 22.1 pg — AB (ref 26.0–34.0)
MCHC: 30.3 g/dL — ABNORMAL LOW (ref 32.0–36.0)
MCV: 72.8 fL — AB (ref 80.0–100.0)
Monocytes Absolute: 0.6 10*3/uL (ref 0.2–0.9)
Monocytes Relative: 8 %
NEUTROS ABS: 5.5 10*3/uL (ref 1.4–6.5)
NEUTROS PCT: 75 %
Platelets: 302 10*3/uL (ref 150–440)
RBC: 4 MIL/uL (ref 3.80–5.20)
RDW: 17.2 % — ABNORMAL HIGH (ref 11.5–14.5)
WBC: 7.3 10*3/uL (ref 3.6–11.0)

## 2015-01-25 LAB — PROTIME-INR
INR: 1.02
PROTHROMBIN TIME: 13.6 s (ref 11.4–15.0)

## 2015-01-25 LAB — APTT: aPTT: 29 seconds (ref 24–36)

## 2015-01-25 NOTE — Progress Notes (Signed)
Cancer Center @ [facility] [phone]  [phone]     Jamel Dunton OB: 1946-02-18  MR#: [id number]  WUX#:[id number]  Patient Care Team: Sherrie Mustache, MD as PCP - General (Internal Medicine)  CHIEF COMPLAINT: No chief complaint on file.  Chief Complaint/Diagnosis:   1. IDA, PRIOR BLOOD IN STOOL 08/2013, GI W/U RECOMMENDED, , ANEMIA WITH HGB 8.1, MCV 64, FROM PRIMARY CARE 10/18/13.[name]  GI F/U WITH DR Mechele Collin , RECOMMENDED NOT A CANDIDATE FOR LUMINAL STUDIES, AND PATIENT DID NOT WANT INTERVENTION, ,   2. 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 MEDICAL PROBLEMS INCLUDE CRF, COPD, 02 DEPENDENT, DM 4. 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    Oncology Flowsheet 12/29/2014 12/29/2014 12/30/2014 12/31/2014 01/01/2015 01/02/2015 01/03/2015  enoxaparin (LOVENOX) [place] 40 mg   - - - - -  methylPREDNISolone sodium succinate (SOLU-MEDROL) IV 60 mg 60 mg 60 mg 60 mg 40 mg - -  predniSONE (DELTASONE) PO - - - - - 50 mg 40 mg    INTERVAL HISTORY: 69 year old lady was recently hospitalized and was seen by me.  Patient was admitted in hospital with increasing shortness of breath.  Had a chest x-ray followed by chest CT scan which revealed right upper lobe lung mass which is progressing since last November.  Also mediastinal lymphadenopathy. Patient had refused further workup at that time.  Did refuse further workup during last hospitalization.  Since fully aware that we are dealing with lung cancer which is progressing at this point in time.  Patient is now on oxygen.  Patient had been a previous smoker and exact history whether she has quit smoking is not known. No chest pain.  REVIEW OF SYSTEMS:   Gen. status: Patient is in wheelchair.  Performance status is 1. HEENT: No difficulty swallowing no headache. Lungs: Increasing shortness of breath on oxygen Cardiac: No chest pain no paroxysmal nocturnal dyspnea  the patient GI: No nausea no vomiting no diarrhea GU: No dysuria hematuria Musculoskeletal system no bony pain Skin: No rash Neurological system no headache no dizziness  As per HPI. Otherwise, a complete review of systems is negatve.  PAST MEDICAL HISTORY: Past Medical History  Diagnosis Date  . COPD (chronic obstructive pulmonary disease)   . Chronic respiratory failure   . Diabetes mellitus without complication   . Chronic diastolic heart failure   . Hypertension   . 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 Cholecystectomy      FAMILY HISTORY Family History  Problem Relation Age of Onset  . Hypertension Mother   . Hypertension Father     ADVANCED DIRECTIVES:  No flowsheet data found.  HEALTH MAINTENANCE: Social History  Substance Use Topics  . Smoking status: Former Smoker -- 3.00 packs/day for 20 years  . Smokeless tobacco: Not on file  . Alcohol Use: No      Allergies  Allergen Reactions  . Nickel Other (See Comments)  . Simvastatin Other (See Comments)    Current Outpatient Prescriptions  Medication Sig Dispense Refill  . acetaminophen (TYLENOL) 325 MG tablet Take 325 mg by mouth every 4 (four) hours as needed for pain.    [name] amLODipine (NORVASC) 5 MG tablet Take 5 mg by mouth daily.    [name] aspirin 81 MG tablet  Take 81 mg by mouth daily.    . COMBIVENT RESPIMAT 20-100 MCG/ACT AERS respimat 1 puff 4 (four) times daily.     . ferrous sulfate 325 (65 FE) MG tablet Take 325 mg by mouth 3 (three) times daily with meals.    . furosemide (LASIX) 40 MG tablet Take 40 mg by mouth. Takes 2 tablets am and 1 tablet pm daily.    [name] glipiZIDE (GLUCOTROL) 10 MG tablet Take 10 mg by mouth 2 (two) times daily before a meal.    . mometasone-formoterol (DULERA) 200-5 MCG/ACT AERO Inhale 2 puffs into the lungs 2 (two) times daily. 1  Inhaler 0  . pravastatin (PRAVACHOL) 40 MG tablet Take 40 mg by mouth daily.    . quinapril (ACCUPRIL) 40 MG tablet Take 40 mg by mouth 2 (two) times daily.     . Vitamin D, Ergocalciferol, (DRISDOL) 50000 UNITS CAPS capsule Take 50,000 Units by mouth every 7 (seven) days.     No current facility-administered medications for this visit.    OBJECTIVE:  Filed Vitals:   01/25/15 1226  BP: 124/71  Pulse: 97  Temp: 98.2 F (36.8 C)     Body mass index is 38.6 kg/(m^2).    ECOG FS:1 - Symptomatic but completely ambulatory  PHYSICAL EXAM: Gen. status: Moderately obese lady not any acute distress Lymphatic system: Supraclavicular, cervical, axillary, inguinal lymph nodes are not palpable Head exam was generally normal. There was no scleral icterus or corneal arcus. Mucous membranes were moist. Lungs: Diminished air entry on both sides.  Occasional crepitation and rhonchi on both sides patient is on oxygen Breast was not examined Cardiac exam revealed the PMI to be normally situated and sized. The rhythm was regular and no extrasystoles were noted during several minutes of auscultation. The first and second heart sounds were normal and physiologic splitting of the second heart sound was noted. There were no murmurs, rubs, clicks, or gallops. Abdominal exam revealed normal bowel sounds. The abdomen was soft, non-tender, and without masses, organomegaly, or appreciable enlargement of the abdominal aorta. Lower extremity no edema Neurologically, the patient was awake, alert, and oriented to person, place and time. There were no obvious focal neurologic abnormalities. Skin: No rash   LAB RESULTS:  CBC Latest Ref Rng 01/03/2015 01/02/2015  WBC 3.6 - 11.0 K/uL 16.1(H) 14.2(H)  Hemoglobin 12.0 - 16.0 g/dL 8.1(X) 8.1(L)  Hematocrit 35.0 - 47.0 % 28.0(L) 27.0(L)  Platelets 150 - 440 K/uL 258 241        STUDIES: Ct Chest Wo Contrast  12/28/2014   CLINICAL DATA:  Worsening right lung mass.  Shortness of breath likely due to COPD exacerbation. Symptoms since 12/27/2014.  EXAM: CT CHEST WITHOUT CONTRAST  TECHNIQUE: Multidetector CT imaging of the chest was performed following the standard protocol without IV contrast.  COMPARISON:  Plain film of the chest 12/27/2014 and CT chest 03/16/2014.  FINDINGS: A subcarinal lymph node to the right on image 26 measures 1.1 cm short axis dimension. No axillary lymphadenopathy is identified. No pleural or pericardial effusion. Cardiomegaly is noted. Mild aortic atherosclerosis is identified.  The lungs demonstrate changes of centrilobular emphysema. Mass in the right lower lobe which had measured 4.2 x 3.1 cm has increased in size to 5.7 x 4.6 cm on image 36. The mass abuts the pleural surface of the lateral chest and right hemidiaphragm. A few small calcifications are identified in the mass, new since the prior exam. Mild basilar atelectasis is noted. Scarring in the  periphery of the lingula is unchanged.  Incidentally imaged upper abdomen including the adrenal glands is unremarkable.  IMPRESSION: Interval increase in the size of a right lower lobe pulmonary mass in this patient with emphysema is most worrisome for bronchogenic carcinoma. 1.1 cm subcarinal lymph node may be incidental but could be mildly enlarged due to metastatic disease.  Cardiomegaly without edema.   Electronically Signed   By: Drusilla Kanner M.D.   On: 12/28/2014 14:51   US Renal  12/31/2014   CLINICAL DATA:  Acute on chronic renal failure  EXAM: RENAL / URINARY TRACT ULTRASOUND COMPLETE  COMPARISON:  10/11/2013  FINDINGS: Right Kidney:  Length: 10.2 cm. Echogenicity within normal limits. No mass or hydronephrosis visualized.  Left Kidney:  Length: 12.1 cm. A 2 cm cyst is again seen in the lower pole slightly decreased from the prior exam.  Bladder:  Appears normal for degree of bladder distention.  IMPRESSION: Left renal cyst decreased in size from the prior study.  No other focal  abnormality is noted.   Electronically Signed   By: Alcide Clever M.D.   On: 12/31/2014 12:39   US Venous Img Lower Bilateral  12/28/2014   CLINICAL DATA:  Shortness of Breath  EXAM: BILATERAL LOWER EXTREMITY VENOUS DOPPLER ULTRASOUND  TECHNIQUE: Gray-scale sonography with compression, as well as color and duplex ultrasound, were performed to evaluate the deep venous system from the level of the common femoral vein through the popliteal and proximal calf veins.  COMPARISON:  None  FINDINGS: Normal compressibility of the common femoral, superficial femoral, and popliteal veins, as well as the proximal calf veins. No filling defects to suggest DVT on grayscale or color Doppler imaging. Doppler waveforms show normal direction of venous flow, normal respiratory phasicity and response to augmentation.  IMPRESSION: 1. No evidence of lower extremity deep vein thrombosis, BILATERALLY.   Electronically Signed   By: Corlis Leak M.D.   On: 12/28/2014 15:39   Dg Chest Portable 1 View  12/27/2014   CLINICAL DATA:  Increase shortness of breath, hypoxemia beginning last night.  EXAM: PORTABLE CHEST - 1 VIEW  COMPARISON:  10/10/2013  FINDINGS: There is cardiomegaly. Continued right basilar opacity, enlarged since prior study. Cannot exclude enlarging mass/cancer in the right lung base as described on prior CT. No overt edema. No visible effusions. No acute bony abnormality.  IMPRESSION: Worsening masslike opacity at the right lung base. Cannot exclude lung cancer as described on prior chest CT.  Cardiomegaly.   Electronically Signed   By: Charlett Nose M.D.   On: 12/27/2014 15:28    ASSESSMENT: 1.  Right lower lobe mass which is increased in size. I reviewed the CT scan with the patient and compared with the last November CT scan Patient is fully aware that mass is progressing with gastric lymph node has developed. I discussed case with the radiologist who initially was reluctant to go biopsy and wanted to PET scan  followed by bronchoscopy Patient did not want to go through under general anesthesia or sedation Discussing that again with the radiologist biopsy can be done a PET scan shows PET positive right lower  lobe mass. Patient is not sure whether she wants to go through with or not but agreeable to go to PET scan and then discuss possibility of biopsy 2.  COPD on oxygen 3.  Iron deficiency anemia 4.  Moderate obesity  MEDICAL DECISION MAKING:  Case was discussed in tumor conference Discussed withradiologist regarding biopsy Pet scan being  arrange d  if approved by insurance company , Total duration of visit was 45 minutes.  50% or more time was spent in counseling patient and family regarding prognosis and options of treatment and available resources Reviewing PET scan independently and reviewing with the patient.  Discussing biopsy as well as discussing with the radiologist. Patient expressed understanding and was in agreement with this plan. She also understands that She can call clinic at any time with any questions, concerns, or complaints.    No matching staging information was found for the patient.  Johney Maine, MD   01/25/2015 12:55 PM

## 2015-01-27 ENCOUNTER — Other Ambulatory Visit: Payer: Self-pay | Admitting: *Deleted

## 2015-01-27 DIAGNOSIS — R918 Other nonspecific abnormal finding of lung field: Secondary | ICD-10-CM

## 2015-01-31 ENCOUNTER — Encounter: Payer: Self-pay | Admitting: Oncology

## 2015-01-31 ENCOUNTER — Telehealth: Payer: Self-pay | Admitting: *Deleted

## 2015-01-31 NOTE — Telephone Encounter (Signed)
Needs order sent to [facility] for portable oxygen tanks for use going to md visits Phone [phone] Fax [phone]

## 2015-02-01 ENCOUNTER — Ambulatory Visit: Payer: Medicare Other | Admitting: Family Medicine

## 2015-02-01 ENCOUNTER — Other Ambulatory Visit: Payer: Medicare Other

## 2015-02-01 NOTE — Telephone Encounter (Signed)
Order faxed to fax number provided on 02/01/15 @ 0835

## 2015-02-02 ENCOUNTER — Ambulatory Visit: Admission: RE | Admit: 2015-02-02 | Payer: Medicare Other | Source: Ambulatory Visit

## 2015-02-06 ENCOUNTER — Ambulatory Visit
Admission: RE | Admit: 2015-02-06 | Discharge: 2015-02-06 | Disposition: A | Discharge status: Home / Self Care | Payer: Medicare Other | Source: Ambulatory Visit | Attending: Oncology | Admitting: Oncology

## 2015-02-06 ENCOUNTER — Ambulatory Visit: Payer: Medicare Other | Admitting: Oncology

## 2015-02-06 DIAGNOSIS — R918 Other nonspecific abnormal finding of lung field: Secondary | ICD-10-CM

## 2015-02-06 LAB — GLUCOSE, CAPILLARY: GLUCOSE-CAPILLARY: 156 mg/dL — AB (ref 65–99)

## 2015-02-07 ENCOUNTER — Ambulatory Visit: Admission: RE | Admit: 2015-02-07 | Payer: Medicare Other | Source: Ambulatory Visit

## 2015-02-08 ENCOUNTER — Ambulatory Visit: Payer: Medicare Other | Admitting: Oncology

## 2015-02-09 ENCOUNTER — Ambulatory Visit: Payer: Medicare Other | Admitting: Oncology

## 2015-02-10 ENCOUNTER — Ambulatory Visit: Payer: Medicare Other

## 2015-02-10 ENCOUNTER — Ambulatory Visit: Admission: RE | Admit: 2015-02-10 | Payer: Medicare Other | Source: Ambulatory Visit

## 2015-02-13 ENCOUNTER — Inpatient Hospital Stay: Payer: Medicare Other | Admitting: Oncology

## 2015-02-13 ENCOUNTER — Ambulatory Visit: Payer: Medicare Other | Admitting: Oncology

## 2015-02-17 ENCOUNTER — Telehealth: Payer: Self-pay | Admitting: *Deleted

## 2015-02-17 ENCOUNTER — Ambulatory Visit: Payer: Medicare Other

## 2015-02-17 NOTE — Telephone Encounter (Signed)
Patient on O2, 4-5 L around the clock.  She was getting her O2 and concentrator through [place] but has run out with no backup.  She cancelled [employer] and went with [employer] but they will not deliver oxygen to her.  Ms. Rhonda Garner wants MD to do something today.   Patient had appointment for PET scan @ 12:30 pm today that she refused to go to.  She also has failed the last several appointments here at the clinic.  Called Ms. Rhonda Garner and informed her that they need to contact PCP with this request due to  patient  failing her last several appointments with Dr. [name].  She told me that PCP is out of town today.  I then told her to take patient to ED.

## 2015-02-21 ENCOUNTER — Emergency Department
Admission: EM | Admit: 2015-02-21 | Discharge: 2015-02-21 | Disposition: A | Discharge status: Home / Self Care | Payer: Medicare Other | Attending: Emergency Medicine | Admitting: Emergency Medicine

## 2015-02-21 ENCOUNTER — Encounter: Payer: Self-pay | Admitting: Emergency Medicine

## 2015-02-21 DIAGNOSIS — Z7982 Long term (current) use of aspirin: Secondary | ICD-10-CM | POA: Diagnosis not present

## 2015-02-21 DIAGNOSIS — Z9981 Dependence on supplemental oxygen: Secondary | ICD-10-CM | POA: Insufficient documentation

## 2015-02-21 DIAGNOSIS — E119 Type 2 diabetes mellitus without complications: Secondary | ICD-10-CM | POA: Diagnosis not present

## 2015-02-21 DIAGNOSIS — Z760 Encounter for issue of repeat prescription: Secondary | ICD-10-CM | POA: Insufficient documentation

## 2015-02-21 DIAGNOSIS — N183 Chronic kidney disease, stage 3 (moderate): Secondary | ICD-10-CM | POA: Insufficient documentation

## 2015-02-21 DIAGNOSIS — Z79899 Other long term (current) drug therapy: Secondary | ICD-10-CM | POA: Insufficient documentation

## 2015-02-21 DIAGNOSIS — Z87891 Personal history of nicotine dependence: Secondary | ICD-10-CM | POA: Insufficient documentation

## 2015-02-21 DIAGNOSIS — I509 Heart failure, unspecified: Secondary | ICD-10-CM | POA: Diagnosis not present

## 2015-02-21 DIAGNOSIS — Z7951 Long term (current) use of inhaled steroids: Secondary | ICD-10-CM | POA: Insufficient documentation

## 2015-02-21 DIAGNOSIS — I130 Hypertensive heart and chronic kidney disease with heart failure and stage 1 through stage 4 chronic kidney disease, or unspecified chronic kidney disease: Secondary | ICD-10-CM | POA: Insufficient documentation

## 2015-02-21 NOTE — Discharge Instructions (Signed)
Medicine Refill at the Emergency Department  We have refilled your medicine today, but it is best for you to get refills through your primary health care provider's office. In the future, please plan ahead so you do not need to get refills from the emergency department.  If the medicine we refilled was a maintenance medicine, you may have received only enough to get you by until you are able to see your regular health care provider.     This information is not intended to replace advice given to you by your health care provider. Make sure you discuss any questions you have with your health care provider.     Document Released: 08/16/2003 Document Revised: 05/20/2014 Document Reviewed: 08/06/2013  Elsevier Interactive Patient Education 2016 [employer]

## 2015-02-21 NOTE — ED Provider Notes (Signed)
[facility] Emergency Department Provider Note  ____________________________________________  Time seen: Approximately 4 PM  I have reviewed the triage vital signs and the nursing notes.   HISTORY  Chief Complaint Medication Refill    HPI Rhonda Garner is a 69 y.o. female with a history of COPD on a baseline of 5 L nasal cannula oxygen is presenting today for refill of her oxygen. She says that she called her pulmonologist, Dr. Meredeth Ide, and spoke to one of the office staff who told her that she would need come in for sleep test as well as an appointment in order to get a refill of her oxygen prescription. She was then told she needed a come to the emergency department to have her oxygen refilled. She says that she has 4 times at home in each tank last her about an hour. She is denying any acute complaints at this time. Denies any pain or shortness of breath.   Past Medical History  Diagnosis Date  . COPD (chronic obstructive pulmonary disease) (HCC)   . Chronic respiratory failure (HCC)   . Diabetes mellitus without complication (HCC)   . Chronic diastolic heart failure (HCC)   . Hypertension   . 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 Arthritis 01/25/2015  . CCF (congestive cardiac failure) (HCC) 01/25/2015  . Chronic kidney disease 01/25/2015  . OP (osteoporosis) 01/25/2015  . Adjustment disorder with anxious mood   . Acute on chronic respiratory failure (HCC) 12/27/2014  . COPD exacerbation (HCC) 12/27/2014  . Lung mass 12/27/2014  . Hypokalemia 12/27/2014  . Absolute anemia 11/30/2013  . Chronic renal failure 11/30/2013  . Diabetes (HCC) 11/30/2013  . Elevated WBC count 11/30/2013  . HLD (hyperlipidemia) 11/30/2013  . Obstructive apnea 11/30/2013  . COPD, moderate (HCC) 11/19/2013  . Chronic kidney disease (CKD), stage  III (moderate) 10/21/2013  . Chronic diastolic heart failure (HCC)   . Hypertension   . Heart failure, systolic (HCC) 04/15/2012  . Cataract cortical, senile 07/15/2011  . Cataract, nuclear sclerotic senile 07/15/2011  . Cardiac disease 10/29/2006  . Hypercholesteremia 06/10/2005  . CAFL (chronic airflow limitation) (HCC) 10/28/2003  . Diabetes mellitus type 2, uncontrolled (HCC) 04/12/2000    Past Surgical History  Procedure Laterality Date  . Cholecystectomy      Current Outpatient Rx  Name  Route  Sig  Dispense  Refill  . acetaminophen (TYLENOL) 325 MG tablet   Oral   Take 325 mg by mouth every 4 (four) hours as needed for pain.         [name] amLODipine (NORVASC) 5 MG tablet   Oral   Take 5 mg by mouth daily.         [name] aspirin 81 MG tablet   Oral   Take 81 mg by mouth daily.         . COMBIVENT RESPIMAT 20-100 MCG/ACT AERS respimat      1 puff 4 (four) times daily.          . ferrous sulfate 325 (65 FE) MG tablet   Oral   Take 325 mg by mouth 3 (three) times daily with meals.         . furosemide (LASIX) 40 MG tablet   Oral   Take 40 mg by mouth. Takes 2 tablets am and 1 tablet pm daily.         [name]  glipiZIDE (GLUCOTROL) 10 MG tablet   Oral   Take 10 mg by mouth 2 (two) times daily before a meal.         . mometasone-formoterol (DULERA) 200-5 MCG/ACT AERO   Inhalation   Inhale 2 puffs into the lungs 2 (two) times daily.   1 Inhaler   0   . pravastatin (PRAVACHOL) 40 MG tablet   Oral   Take 40 mg by mouth daily.         . quinapril (ACCUPRIL) 40 MG tablet   Oral   Take 40 mg by mouth 2 (two) times daily.          . Vitamin D, Ergocalciferol, (DRISDOL) 50000 UNITS CAPS capsule   Oral   Take 50,000 Units by mouth every 7 (seven) days.           Allergies Nickel and Simvastatin  Family History  Problem Relation Age of Onset  . Hypertension Mother   . Hypertension Father     Social History Social History  Substance Use Topics   . Smoking status: Former Smoker -- 3.00 packs/day for 20 years  . Smokeless tobacco: None  . Alcohol Use: No    Review of Systems Constitutional: No fever/chills Eyes: No visual changes. ENT: No sore throat. Cardiovascular: Denies chest pain. Respiratory: Denies shortness of breath. Gastrointestinal: No abdominal pain.  No nausea, no vomiting.  No diarrhea.  No constipation. Genitourinary: Negative for dysuria. Musculoskeletal: Negative for back pain. Skin: Negative for rash. Neurological: Negative for headaches, focal weakness or numbness.  10-point ROS otherwise negative.  ____________________________________________   PHYSICAL EXAM:  VITAL SIGNS: ED Triage Vitals  Enc Vitals Group     BP 02/21/15 1557 126/67 mmHg     Pulse Rate 02/21/15 1557 90     Resp 02/21/15 1557 20     Temp 02/21/15 1557 98.7 F (37.1 C)     Temp Source 02/21/15 1557 Oral     SpO2 02/21/15 1557 93 %     Weight 02/21/15 1557 220 lb (99.791 kg)     Height 02/21/15 1557 5\' 4"  (1.626 m)     Head Cir --      Peak Flow --      Pain Score --      Pain Loc --      Pain Edu? --      Excl. in GC? --     Constitutional: Alert and oriented. Well appearing and in no acute distress. Eyes: Conjunctivae are normal. PERRL. EOMI. Head: Atraumatic. Nose: No congestion/rhinnorhea. Wearing nasal cannula oxygen attached to the wall. Mouth/Throat: Mucous membranes are moist.   Neck: No stridor.   Cardiovascular: Normal rate, regular rhythm. Grossly normal heart sounds.  Good peripheral circulation. Respiratory: Normal respiratory effort.  No retractions. Lungs CTAB. Gastrointestinal: Soft and nontender. No distention. No abdominal bruits. No CVA tenderness. Musculoskeletal: No lower extremity tenderness nor edema.  No joint effusions. Neurologic:  Normal speech and language. No gross focal neurologic deficits are appreciated. No gait instability. Skin:  Skin is warm, dry and intact. No rash  noted. Psychiatric: Mood and affect are normal. Speech and behavior are normal.  ____________________________________________   LABS (all labs ordered are listed, but only abnormal results are displayed)  Labs Reviewed - No data to display ____________________________________________  EKG   ____________________________________________  RADIOLOGY   ____________________________________________   PROCEDURES    ____________________________________________   INITIAL IMPRESSION / ASSESSMENT AND PLAN / ED COURSE  Pertinent labs & imaging  results that were available during my care of the patient were reviewed by me and considered in my medical decision making (see chart for details). Call out to social work to assist with getting this patient her oxygen.  ----------------------------------------- 5:12 PM on 02/21/2015 -----------------------------------------  Discussed the case with Dr. Meredeth Ide who is the patient's pulmonologist. I also discussed case with the patient's home health aide. According home health aide, the patient recently switched oxygen companies and therefore is being treated as if she is a new patient. I tested her room air saturation which went down to 82%. I quickly turned back on the 5 L of her baseline at that time and she resolved quickly to 97% with deep breathing. She did not have any respiratory distress or pain during the saturation to 82%. I discussed this with Dr. Meredeth Ide who is going to call the patient's home health aide to try and resolve the situation.  ----------------------------------------- 5:21 PM on 02/21/2015 -----------------------------------------  Patient continues to rest comfortably in the room. Continues to sat 97-98% on her 5 L nasal cannula oxygen. Discussed the case again with Dr. Meredeth Ide who says that he will be able to call the patient's oxygen supplier tomorrow morning so that she has delivered portable oxygen tanks as well as a  concentrator of her own. She is currently using a loaned oxygen concentrator.  The patient does have 4 portable tanks at home and will continue to use the oxygen concentrator she has at home overnight until she is supplied with new portable oxygen. I explained this to both patient and the family who understand the plan and are willing to comply. I will give them follow-up with Dr. Meredeth Ide. ____________________________________________   FINAL CLINICAL IMPRESSION(S) / ED DIAGNOSES  Acute medication refill. Initial visit.    Myrna Blazer, MD 02/21/15 1723

## 2015-02-21 NOTE — ED Notes (Signed)
Pt here for prescription of Home oxygen.  Pt is on oxygen all the time for copd.  No chest pain or sob today.  Pt alert.

## 2015-02-21 NOTE — ED Notes (Signed)
States she is here b/c she needs a rx for more o2 tanks. Denies any other sx or complaints

## 2015-03-01 ENCOUNTER — Inpatient Hospital Stay: Payer: Medicare Other | Attending: Oncology | Admitting: Oncology

## 2015-03-01 ENCOUNTER — Encounter: Payer: Self-pay | Admitting: *Deleted

## 2015-03-01 NOTE — Progress Notes (Signed)
Pt has cancelled PET scan multiple times and has not shown for MD appts multiple times. Per MD, will await pt to call to reschedule appt. All efforts have been exhausted in scheduling patient for PET scan and follow up visits.

## 2015-04-10 ENCOUNTER — Encounter: Payer: Self-pay | Admitting: Oncology

## 2015-04-10 ENCOUNTER — Inpatient Hospital Stay: Payer: Medicare Other | Attending: Oncology | Admitting: Oncology

## 2015-04-10 VITALS — BP 155/87 | HR 118 | Temp 97.7°F | Wt 228.2 lb

## 2015-04-10 DIAGNOSIS — Z87891 Personal history of nicotine dependence: Secondary | ICD-10-CM | POA: Diagnosis not present

## 2015-04-10 DIAGNOSIS — I5032 Chronic diastolic (congestive) heart failure: Secondary | ICD-10-CM

## 2015-04-10 DIAGNOSIS — R918 Other nonspecific abnormal finding of lung field: Secondary | ICD-10-CM | POA: Diagnosis present

## 2015-04-10 DIAGNOSIS — D509 Iron deficiency anemia, unspecified: Secondary | ICD-10-CM | POA: Diagnosis not present

## 2015-04-10 DIAGNOSIS — R590 Localized enlarged lymph nodes: Secondary | ICD-10-CM | POA: Insufficient documentation

## 2015-04-10 DIAGNOSIS — J449 Chronic obstructive pulmonary disease, unspecified: Secondary | ICD-10-CM | POA: Diagnosis not present

## 2015-04-10 DIAGNOSIS — E669 Obesity, unspecified: Secondary | ICD-10-CM

## 2015-04-10 DIAGNOSIS — Z79899 Other long term (current) drug therapy: Secondary | ICD-10-CM | POA: Diagnosis not present

## 2015-04-10 DIAGNOSIS — N183 Chronic kidney disease, stage 3 (moderate): Secondary | ICD-10-CM | POA: Diagnosis not present

## 2015-04-10 DIAGNOSIS — Z7982 Long term (current) use of aspirin: Secondary | ICD-10-CM | POA: Diagnosis not present

## 2015-04-10 DIAGNOSIS — E119 Type 2 diabetes mellitus without complications: Secondary | ICD-10-CM | POA: Insufficient documentation

## 2015-04-10 DIAGNOSIS — I129 Hypertensive chronic kidney disease with stage 1 through stage 4 chronic kidney disease, or unspecified chronic kidney disease: Secondary | ICD-10-CM | POA: Diagnosis not present

## 2015-04-10 DIAGNOSIS — I509 Heart failure, unspecified: Secondary | ICD-10-CM | POA: Insufficient documentation

## 2015-04-10 DIAGNOSIS — Z9981 Dependence on supplemental oxygen: Secondary | ICD-10-CM | POA: Insufficient documentation

## 2015-04-10 NOTE — Progress Notes (Signed)
[facility] @ [facility] [phone]  [phone]     Shon Houghlsie Oftedahl OB: [birth year]  MR#: [id number]  AOZ#:[id number]#:[id number]  Patient Care Team: Sherrie MustacheFayegh Jadali, MD as PCP - General (Internal Medicine)  CHIEF COMPLAINT:  Chief Complaint  Patient presents with  . Lung Cancer   Chief Complaint/Diagnosis:   1. IDA, PRIOR BLOOD IN STOOL 08/2013, GI W/U RECOMMENDED, , ANEMIA WITH HGB 8.1, MCV 64, FROM PRIMARY CARE 10/18/13.[name].  GI F/U WITH DR Mechele CollinELLIOTT , RECOMMENDED NOT A CANDIDATE FOR LUMINAL STUDIES, AND PATIENT DID NOT WANT INTERVENTION, ,   2. 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 MEDICAL PROBLEMS INCLUDE CRF, COPD, 02 DEPENDENT, DM 4. 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    Oncology Flowsheet 12/29/2014 12/29/2014 12/30/2014 12/31/2014 01/01/2015 01/02/2015 01/03/2015  enoxaparin (LOVENOX) [place] 40 mg   - - - - -  methylPREDNISolone sodium succinate (SOLU-MEDROL) IV 60 mg 60 mg 60 mg 60 mg 40 mg - -  predniSONE (DELTASONE) PO - - - - - 50 mg 40 mg    INTERVAL HISTORY: 69 year old lady was recently hospitalized and was seen by me.  Patient was admitted in hospital with increasing shortness of breath.  Had a chest x-ray followed by chest CT scan which revealed right upper lobe lung mass which is progressing since last November.  Also mediastinal lymphadenopathy. Patient had refused further workup at that time.  Did refuse further workup during last hospitalization.  Since fully aware that we are dealing with lung cancer which is progressing at this point in time.  Patient is now on oxygen.  Patient had been a previous smoker and exact history whether she has quit smoking is not known. No chest pain.   Patient is here for further follow-up.  Patient does not want to get biopsy done.  gradually declining condition.  Now on oxygen.    REVIEW OF SYSTEMS:   Gen. status: Patient is in wheelchair.   Performance status is 1. HEENT: No difficulty swallowing no headache. Lungs: Increasing shortness of breath on oxygen Cardiac: No chest pain no paroxysmal nocturnal dyspnea the patient GI: No nausea no vomiting no diarrhea GU: No dysuria hematuria Musculoskeletal system no bony pain Skin: No rash Neurological system no headache no dizziness  As per HPI. Otherwise, a complete review of systems is negatve.  PAST MEDICAL HISTORY: Past Medical History  Diagnosis Date  . COPD (chronic obstructive pulmonary disease) (HCC)   . Chronic respiratory failure (HCC)   . Diabetes mellitus without complication (HCC)   . Chronic diastolic heart failure (HCC)   . Hypertension   . 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 Cholecystectomy      FAMILY HISTORY Family History  Problem Relation Age of Onset  . Hypertension Mother   . Hypertension Father     ADVANCED DIRECTIVES:  No flowsheet data found.  HEALTH MAINTENANCE: Social History  Substance Use Topics  . Smoking status: Former Smoker -- 3.00 packs/day for 20 years  . Smokeless tobacco: None  . Alcohol Use: No      Allergies  Allergen Reactions  . Nickel Other (See Comments)  . Simvastatin Other (See Comments)    Current Outpatient Prescriptions  Medication Sig Dispense Refill  . acetaminophen (TYLENOL) 325 MG  tablet Take 325 mg by mouth every 4 (four) hours as needed for pain.    [name] amLODipine (NORVASC) 5 MG tablet Take 5 mg by mouth daily.    [name] aspirin 81 MG tablet Take 81 mg by mouth daily.    . COMBIVENT RESPIMAT 20-100 MCG/ACT AERS respimat 1 puff 4 (four) times daily.     . fluticasone (FLONASE) 50 MCG/ACT nasal spray Place into the nose.    . furosemide (LASIX) 40 MG tablet Take 40 mg by mouth. Takes 2 tablets am and 1 tablet pm daily.    [name] glipiZIDE (GLUCOTROL) 10 MG  tablet Take 10 mg by mouth 2 (two) times daily before a meal.    . mometasone-formoterol (DULERA) 200-5 MCG/ACT AERO Inhale 2 puffs into the lungs 2 (two) times daily. 1 Inhaler 0  . pravastatin (PRAVACHOL) 40 MG tablet Take 40 mg by mouth daily.    . quinapril (ACCUPRIL) 40 MG tablet Take 40 mg by mouth 2 (two) times daily.     . Vitamin D, Ergocalciferol, (DRISDOL) 50000 UNITS CAPS capsule Take 50,000 Units by mouth every 7 (seven) days.    [name] glipiZIDE (GLUCOTROL XL) 5 MG 24 hr tablet TK 1 T PO BID.  4  . levalbuterol (XOPENEX) 1.25 MG/3ML nebulizer solution USE 1 VIAL VIA NEBULIZER QID.  3  . ONETOUCH VERIO test strip USE 1 STRIP BID  4  . SYMBICORT 160-4.5 MCG/ACT inhaler INL 2 PFS INTO THE LUNGS BID  12   No current facility-administered medications for this visit.    OBJECTIVE:  Filed Vitals:   04/10/15 1417  BP: 155/87  Pulse: 118  Temp: 97.7 F (36.5 C)     Body mass index is 39.15 kg/(m^2).    ECOG FS:1 - Symptomatic but completely ambulatory  PHYSICAL EXAM: Gen. status: Moderately obese lady not any acute distress Lymphatic system: Supraclavicular, cervical, axillary, inguinal lymph nodes are not palpable Head exam was generally normal. There was no scleral icterus or corneal arcus. Mucous membranes were moist. Lungs: Diminished air entry on both sides.  Occasional crepitation and rhonchi on both sides patient is on oxygen Breast was not examined Cardiac exam revealed the PMI to be normally situated and sized. The rhythm was regular and no extrasystoles were noted during several minutes of auscultation. The first and second heart sounds were normal and physiologic splitting of the second heart sound was noted. There were no murmurs, rubs, clicks, or gallops. Abdominal exam revealed normal bowel sounds. The abdomen was soft, non-tender, and without masses, organomegaly, or appreciable enlargement of the abdominal aorta. Lower extremity no edema Neurologically, the patient was  awake, alert, and oriented to person, place and time. There were no obvious focal neurologic abnormalities. Skin: No rash   LAB RESULTS:  CBC Latest Ref Rng 01/25/2015 01/03/2015  WBC 3.6 - 11.0 K/uL 7.3 16.1(H)  Hemoglobin 12.0 - 16.0 g/dL 4.0(J) 8.1(X)  Hematocrit 35.0 - 47.0 % 29.1(L) 28.0(L)  Platelets 150 - 440 K/uL 302 258        STUDIES: No results found.  ASSESSMENT: 1.  Right lower lobe mass which is increased in size. I reviewed the CT scan with the patient and compared with the last November CT scan Patient is fully aware that mass is progressing with gastric lymph node has developed. I discussed case with the radiologist who initially was reluctant to go biopsy and wanted to PET scan followed by bronchoscopy Patient did not want to go through under general anesthesia  or sedation Discussing that again with the radiologist biopsy can be done a PET scan shows PET positive right lower  lobe mass. Patient is not sure whether she wants to go through with or not but agreeable to go to PET scan and then discuss possibility of biopsy 2.  COPD on oxygen 3.  Iron deficiency anemia 4.  Moderate obesity  MEDICAL DECISION MAKING:  We  planned biopsy during last visit but patient refused to go for biopsy.  At present time I had again prolonged discussion with patient that I do believe we are dealing with cancer.  But patient is not willing to go through any biopsy or any further treatment at present time   After prolonged discussion we decided that patient would be discharged from my care and will be followed primarily by primary care physician for management of symptoms and if needed by palliative care   If patient changes her mind regarding biopsy or further treatment of cancer I would be available.  We will discuss that with primary care physician  Duration of visit is 25 minutes and 50% of time was spent discussing VARIOUS  options or alleviating care with other physicians  social worker  Patient expressed understanding and was in agreement with this plan. She also understands that She can call clinic at any time with any questions, concerns, or complaints.    No matching staging information was found for the patient.  Johney Maine, MD   04/10/2015 2:40 PM

## 2015-04-14 ENCOUNTER — Emergency Department: Payer: Medicare Other

## 2015-04-14 ENCOUNTER — Encounter: Payer: Self-pay | Admitting: Emergency Medicine

## 2015-04-14 ENCOUNTER — Inpatient Hospital Stay
Admission: EM | Admit: 2015-04-14 | Discharge: 2015-05-14 | DRG: 208 | Disposition: E | Payer: Medicare Other | Attending: Internal Medicine | Admitting: Internal Medicine

## 2015-04-14 DIAGNOSIS — E1122 Type 2 diabetes mellitus with diabetic chronic kidney disease: Secondary | ICD-10-CM | POA: Diagnosis present

## 2015-04-14 DIAGNOSIS — J189 Pneumonia, unspecified organism: Secondary | ICD-10-CM | POA: Diagnosis present

## 2015-04-14 DIAGNOSIS — J9602 Acute respiratory failure with hypercapnia: Secondary | ICD-10-CM | POA: Diagnosis not present

## 2015-04-14 DIAGNOSIS — Z9981 Dependence on supplemental oxygen: Secondary | ICD-10-CM | POA: Diagnosis not present

## 2015-04-14 DIAGNOSIS — J9690 Respiratory failure, unspecified, unspecified whether with hypoxia or hypercapnia: Secondary | ICD-10-CM

## 2015-04-14 DIAGNOSIS — A419 Sepsis, unspecified organism: Secondary | ICD-10-CM | POA: Diagnosis present

## 2015-04-14 DIAGNOSIS — J9622 Acute and chronic respiratory failure with hypercapnia: Secondary | ICD-10-CM | POA: Diagnosis present

## 2015-04-14 DIAGNOSIS — J440 Chronic obstructive pulmonary disease with acute lower respiratory infection: Secondary | ICD-10-CM | POA: Diagnosis present

## 2015-04-14 DIAGNOSIS — I5032 Chronic diastolic (congestive) heart failure: Secondary | ICD-10-CM | POA: Diagnosis present

## 2015-04-14 DIAGNOSIS — N184 Chronic kidney disease, stage 4 (severe): Secondary | ICD-10-CM | POA: Diagnosis present

## 2015-04-14 DIAGNOSIS — R000 Tachycardia, unspecified: Secondary | ICD-10-CM | POA: Diagnosis present

## 2015-04-14 DIAGNOSIS — I130 Hypertensive heart and chronic kidney disease with heart failure and stage 1 through stage 4 chronic kidney disease, or unspecified chronic kidney disease: Secondary | ICD-10-CM | POA: Diagnosis present

## 2015-04-14 DIAGNOSIS — Z66 Do not resuscitate: Secondary | ICD-10-CM | POA: Diagnosis present

## 2015-04-14 DIAGNOSIS — Z85118 Personal history of other malignant neoplasm of bronchus and lung: Secondary | ICD-10-CM

## 2015-04-14 DIAGNOSIS — Z87891 Personal history of nicotine dependence: Secondary | ICD-10-CM | POA: Diagnosis not present

## 2015-04-14 DIAGNOSIS — Z4659 Encounter for fitting and adjustment of other gastrointestinal appliance and device: Secondary | ICD-10-CM

## 2015-04-14 DIAGNOSIS — E869 Volume depletion, unspecified: Secondary | ICD-10-CM | POA: Diagnosis present

## 2015-04-14 DIAGNOSIS — I959 Hypotension, unspecified: Secondary | ICD-10-CM | POA: Diagnosis present

## 2015-04-14 DIAGNOSIS — Z515 Encounter for palliative care: Secondary | ICD-10-CM | POA: Diagnosis present

## 2015-04-14 DIAGNOSIS — Z8249 Family history of ischemic heart disease and other diseases of the circulatory system: Secondary | ICD-10-CM | POA: Diagnosis not present

## 2015-04-14 DIAGNOSIS — R4182 Altered mental status, unspecified: Secondary | ICD-10-CM

## 2015-04-14 DIAGNOSIS — J9621 Acute and chronic respiratory failure with hypoxia: Secondary | ICD-10-CM | POA: Diagnosis present

## 2015-04-14 DIAGNOSIS — N289 Disorder of kidney and ureter, unspecified: Secondary | ICD-10-CM

## 2015-04-14 DIAGNOSIS — N179 Acute kidney failure, unspecified: Secondary | ICD-10-CM | POA: Diagnosis present

## 2015-04-14 DIAGNOSIS — R0600 Dyspnea, unspecified: Secondary | ICD-10-CM

## 2015-04-14 DIAGNOSIS — D649 Anemia, unspecified: Secondary | ICD-10-CM | POA: Diagnosis present

## 2015-04-14 DIAGNOSIS — I509 Heart failure, unspecified: Secondary | ICD-10-CM | POA: Diagnosis not present

## 2015-04-14 DIAGNOSIS — J181 Lobar pneumonia, unspecified organism: Secondary | ICD-10-CM

## 2015-04-14 DIAGNOSIS — I5031 Acute diastolic (congestive) heart failure: Secondary | ICD-10-CM | POA: Diagnosis not present

## 2015-04-14 DIAGNOSIS — J9601 Acute respiratory failure with hypoxia: Secondary | ICD-10-CM | POA: Diagnosis not present

## 2015-04-14 DIAGNOSIS — R918 Other nonspecific abnormal finding of lung field: Secondary | ICD-10-CM | POA: Diagnosis present

## 2015-04-14 DIAGNOSIS — N189 Chronic kidney disease, unspecified: Secondary | ICD-10-CM

## 2015-04-14 DIAGNOSIS — Z9889 Other specified postprocedural states: Secondary | ICD-10-CM

## 2015-04-14 LAB — URINALYSIS COMPLETE WITH MICROSCOPIC (ARMC ONLY)
Bilirubin Urine: NEGATIVE
Glucose, UA: NEGATIVE mg/dL
KETONES UR: NEGATIVE mg/dL
LEUKOCYTES UA: NEGATIVE
NITRITE: NEGATIVE
PH: 5 (ref 5.0–8.0)
PROTEIN: 100 mg/dL — AB
SPECIFIC GRAVITY, URINE: 1.014 (ref 1.005–1.030)

## 2015-04-14 LAB — PROTIME-INR
INR: 1.11
Prothrombin Time: 14.5 seconds (ref 11.4–15.0)

## 2015-04-14 LAB — CBC WITH DIFFERENTIAL/PLATELET
Basophils Absolute: 0 10*3/uL (ref 0–0.1)
Basophils Relative: 0 %
EOS ABS: 0.3 10*3/uL (ref 0–0.7)
Eosinophils Relative: 3 %
HEMATOCRIT: 26.1 % — AB (ref 35.0–47.0)
HEMOGLOBIN: 7.3 g/dL — AB (ref 12.0–16.0)
LYMPHS ABS: 1 10*3/uL (ref 1.0–3.6)
MCH: 20.4 pg — AB (ref 26.0–34.0)
MCHC: 27.9 g/dL — AB (ref 32.0–36.0)
MCV: 73.1 fL — AB (ref 80.0–100.0)
Monocytes Absolute: 1.2 10*3/uL — ABNORMAL HIGH (ref 0.2–0.9)
NEUTROS ABS: 10.2 10*3/uL — AB (ref 1.4–6.5)
Platelets: 283 10*3/uL (ref 150–440)
RBC: 3.57 MIL/uL — ABNORMAL LOW (ref 3.80–5.20)
RDW: 18.1 % — ABNORMAL HIGH (ref 11.5–14.5)
WBC: 12.7 10*3/uL — ABNORMAL HIGH (ref 3.6–11.0)

## 2015-04-14 LAB — COMPREHENSIVE METABOLIC PANEL
ALT: 27 U/L (ref 14–54)
AST: 26 U/L (ref 15–41)
Albumin: 3.5 g/dL (ref 3.5–5.0)
Alkaline Phosphatase: 95 U/L (ref 38–126)
Anion gap: 8 (ref 5–15)
BUN: 50 mg/dL — ABNORMAL HIGH (ref 6–20)
CHLORIDE: 98 mmol/L — AB (ref 101–111)
CO2: 40 mmol/L — AB (ref 22–32)
CREATININE: 2.85 mg/dL — AB (ref 0.44–1.00)
Calcium: 8.9 mg/dL (ref 8.9–10.3)
GFR calc non Af Amer: 16 mL/min — ABNORMAL LOW (ref >60)
GFR, EST AFRICAN AMERICAN: 19 mL/min — AB (ref >60)
Glucose, Bld: 201 mg/dL — ABNORMAL HIGH (ref 65–99)
Potassium: 3.9 mmol/L (ref 3.5–5.1)
SODIUM: 146 mmol/L — AB (ref 135–145)
Total Bilirubin: <0.1 mg/dL — ABNORMAL LOW (ref 0.3–1.2)
Total Protein: 6.7 g/dL (ref 6.5–8.1)

## 2015-04-14 LAB — BLOOD GAS, ARTERIAL
ACID-BASE EXCESS: 11.5 mmol/L — AB (ref 0.0–3.0)
ALLENS TEST (PASS/FAIL): POSITIVE — AB
ALLENS TEST (PASS/FAIL): POSITIVE — AB
Bicarbonate: 41.2 mEq/L — ABNORMAL HIGH (ref 21.0–28.0)
FIO2: 1
FIO2: 0.5
MECHVT: 450 mL
Mechanical Rate: 16
O2 Saturation: 95.6 %
PATIENT TEMPERATURE: 37
PCO2 ART: 130 mmHg — AB (ref 32.0–48.0)
PEEP/CPAP: 5 cmH2O
PO2 ART: 192 mmHg — AB (ref 83.0–108.0)
Patient temperature: 37
RATE: 16 resp/min
pCO2 arterial: 80 mmHg (ref 32.0–48.0)
pH, Arterial: 7.16 — CL (ref 7.350–7.450)
pH, Arterial: 7.32 — ABNORMAL LOW (ref 7.350–7.450)
pO2, Arterial: 85 mmHg (ref 83.0–108.0)

## 2015-04-14 LAB — LACTIC ACID, PLASMA
LACTIC ACID, VENOUS: 1 mmol/L (ref 0.5–2.0)
Lactic Acid, Venous: 1 mmol/L (ref 0.5–2.0)

## 2015-04-14 LAB — BRAIN NATRIURETIC PEPTIDE: B Natriuretic Peptide: 1014 pg/mL — ABNORMAL HIGH (ref 0.0–100.0)

## 2015-04-14 LAB — LIPASE, BLOOD: Lipase: 86 U/L — ABNORMAL HIGH (ref 11–51)

## 2015-04-14 LAB — APTT: APTT: 29 s (ref 24–36)

## 2015-04-14 LAB — TROPONIN I: Troponin I: 0.1 ng/mL — ABNORMAL HIGH (ref <0.031)

## 2015-04-14 LAB — GLUCOSE, CAPILLARY: Glucose-Capillary: 185 mg/dL — ABNORMAL HIGH (ref 65–99)

## 2015-04-14 MED ORDER — SODIUM CHLORIDE 0.9 % IV BOLUS (SEPSIS): 1000.0000 mL | INTRAVENOUS | Status: DC

## 2015-04-14 MED ORDER — SODIUM CHLORIDE 0.9 % IV SOLN
INTRAVENOUS | Status: DC
  Administered 2015-04-14: 100 mL/h via INTRAVENOUS
  Administered 2015-04-15 – 2015-04-17 (×7): via INTRAVENOUS

## 2015-04-14 MED ORDER — PROPOFOL 1000 MG/100ML IV EMUL
INTRAVENOUS | Status: AC
  Administered 2015-04-14: 1000 mg
  Filled 2015-04-14: qty 100

## 2015-04-14 MED ORDER — ONDANSETRON HCL 4 MG/2ML IJ SOLN: 4.0000 mg | Freq: Four times a day (QID) | INTRAMUSCULAR | Status: DC | PRN

## 2015-04-14 MED ORDER — SODIUM CHLORIDE 0.9 % IV BOLUS (SEPSIS)
1000.0000 mL | Freq: Once | INTRAVENOUS | Status: AC
  Administered 2015-04-14: 1000 mL via INTRAVENOUS

## 2015-04-14 MED ORDER — ETOMIDATE 2 MG/ML IV SOLN
20.0000 mg | Freq: Once | INTRAVENOUS | Status: AC
  Administered 2015-04-14: 20 mg via INTRAVENOUS

## 2015-04-14 MED ORDER — FAMOTIDINE IN NACL 20-0.9 MG/50ML-% IV SOLN
20.0000 mg | Freq: Two times a day (BID) | INTRAVENOUS | Status: DC
  Filled 2015-04-14: qty 50

## 2015-04-14 MED ORDER — SODIUM CHLORIDE 0.9 % IV BOLUS (SEPSIS): 500.0000 mL | INTRAVENOUS | Status: DC

## 2015-04-14 MED ORDER — FAMOTIDINE IN NACL 20-0.9 MG/50ML-% IV SOLN
20.0000 mg | INTRAVENOUS | Status: DC
  Administered 2015-04-15 – 2015-04-16 (×3): 20 mg via INTRAVENOUS
  Filled 2015-04-14 (×4): qty 50

## 2015-04-14 MED ORDER — SODIUM CHLORIDE 0.9 % IJ SOLN
3.0000 mL | Freq: Two times a day (BID) | INTRAMUSCULAR | Status: DC
  Administered 2015-04-15 – 2015-04-17 (×3): 3 mL via INTRAVENOUS

## 2015-04-14 MED ORDER — VANCOMYCIN HCL IN DEXTROSE 1-5 GM/200ML-% IV SOLN
1000.0000 mg | Freq: Once | INTRAVENOUS | Status: AC
  Administered 2015-04-14: 1000 mg via INTRAVENOUS
  Filled 2015-04-14: qty 200

## 2015-04-14 MED ORDER — MORPHINE SULFATE (PF) 2 MG/ML IV SOLN: 2.0000 mg | INTRAVENOUS | Status: DC | PRN

## 2015-04-14 MED ORDER — CHLORHEXIDINE GLUCONATE 0.12% ORAL RINSE (MEDLINE KIT)
15.0000 mL | Freq: Two times a day (BID) | OROMUCOSAL | Status: DC
  Administered 2015-04-15 – 2015-04-17 (×6): 15 mL via OROMUCOSAL
  Filled 2015-04-14 (×8): qty 15

## 2015-04-14 MED ORDER — ACETAMINOPHEN 325 MG PO TABS: 650.0000 mg | ORAL_TABLET | Freq: Four times a day (QID) | ORAL | Status: DC | PRN

## 2015-04-14 MED ORDER — PIPERACILLIN-TAZOBACTAM 3.375 G IVPB 30 MIN
3.3750 g | Freq: Once | INTRAVENOUS | Status: AC
  Administered 2015-04-14: 3.375 g via INTRAVENOUS
  Filled 2015-04-14: qty 50

## 2015-04-14 MED ORDER — HEPARIN SODIUM (PORCINE) 5000 UNIT/ML IJ SOLN
5000.0000 [IU] | Freq: Three times a day (TID) | INTRAMUSCULAR | Status: DC
  Administered 2015-04-15 – 2015-04-17 (×7): 5000 [IU] via SUBCUTANEOUS
  Filled 2015-04-14 (×8): qty 1

## 2015-04-14 MED ORDER — ACETAMINOPHEN 650 MG RE SUPP: 650.0000 mg | Freq: Four times a day (QID) | RECTAL | Status: DC | PRN

## 2015-04-14 MED ORDER — ANTISEPTIC ORAL RINSE SOLUTION (CORINZ)
7.0000 mL | Freq: Four times a day (QID) | OROMUCOSAL | Status: DC
  Administered 2015-04-15 – 2015-04-17 (×12): 7 mL via OROMUCOSAL
  Filled 2015-04-14 (×14): qty 7

## 2015-04-14 MED ORDER — ONDANSETRON HCL 4 MG PO TABS: 4.0000 mg | ORAL_TABLET | Freq: Four times a day (QID) | ORAL | Status: DC | PRN

## 2015-04-14 MED ORDER — ROCURONIUM BROMIDE 50 MG/5ML IV SOLN
120.0000 mg | Freq: Once | INTRAVENOUS | Status: AC
  Administered 2015-04-14: 120 mg via INTRAVENOUS

## 2015-04-14 MED ORDER — OXYCODONE HCL 5 MG PO TABS: 5.0000 mg | ORAL_TABLET | ORAL | Status: DC | PRN

## 2015-04-14 NOTE — H&P (Signed)
[facility] [place] at [facility]   PATIENT NAME: Rhonda Garner    MR#:  [id number]  DATE OF BIRTH:  September 08, 1945   DATE OF ADMISSION:  04/16/2015  PRIMARY CARE PHYSICIAN: Sherrie Mustache, MD   REQUESTING/REFERRING PHYSICIAN: York Cerise  CHIEF COMPLAINT:   Chief Complaint  Patient presents with  . Altered Mental Status    HISTORY OF PRESENT ILLNESS:  Rhonda Garner  is a 69 y.o. female with a known history of COPD chronic respiratory failure 2 L nasal cannula Baseline, right lower lobe lung mass highly suspicious for cancer but patient refuses further workup or treatment who is presenting with altered mental status. Patient unable to provide meaningful information given mental status/medical condition. History obtained from ED staff as well as EMS. EMS states originally called for patient difficulty standing when they assessed her the noted her to be confused and she desaturated to 61% while ambulating. In the emergency department noted to be lethargic/confused placed on BiPAP failed BiPAP therapy required intubation.  PAST MEDICAL HISTORY:   Past Medical History  Diagnosis Date  . COPD (chronic obstructive pulmonary disease) (HCC)   . Chronic respiratory failure (HCC)   . Diabetes mellitus without complication (HCC)   . Chronic diastolic heart failure (HCC)   . Hypertension   . 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 Cholecystectomy      SOCIAL HISTORY:   Social History  Substance Use Topics  . Smoking status: Former Smoker -- 3.00 packs/day for 20 years  . Smokeless tobacco: Not on file  . Alcohol Use: No    FAMILY HISTORY:   Family History  Problem Relation Age of Onset  . Hypertension Mother   . Hypertension Father     DRUG ALLERGIES:   Allergies  Allergen Reactions  . Nickel Other  (See Comments)    Reaction:  Unknown   . Simvastatin Other (See Comments)    Reaction:  Unknown     REVIEW OF SYSTEMS:  Unable to obtain given patient's mental status/medical condition   MEDICATIONS AT HOME:   Prior to Admission medications   Medication Sig Start Date End Date Taking? Authorizing Provider  acetaminophen (TYLENOL) 325 MG tablet Take 325-650 mg by mouth every 4 (four) hours as needed for mild pain or headache.    Yes Historical Provider, MD  amLODipine (NORVASC) 5 MG tablet Take 5 mg by mouth daily.   Yes Historical Provider, MD  aspirin EC 81 MG tablet Take 81 mg by mouth daily.   Yes Historical Provider, MD  budesonide-formoterol (SYMBICORT) 160-4.5 MCG/ACT inhaler Inhale 2 puffs into the lungs 2 (two) times daily.   Yes Historical Provider, MD  COMBIVENT RESPIMAT 20-100 MCG/ACT AERS respimat Inhale 1 puff into the lungs 3 (three) times daily.    Yes Historical Provider, MD  fluticasone (FLONASE) 50 MCG/ACT nasal spray Place 2 sprays into both nostrils daily.   Yes Historical Provider, MD  furosemide (LASIX) 40 MG tablet Take 40-80 mg by mouth 2 (two) times daily. Pt takes two tablets in the morning and one tablet in the evening.   Yes Historical Provider, MD  glipiZIDE (GLUCOTROL XL) 5 MG 24 hr tablet Take 5 mg by mouth 2 (two) times daily.   Yes Historical Provider, MD  levalbuterol Pauline Aus) 1.25 MG/3ML nebulizer solution Take 1.25 mg by  nebulization 4 (four) times daily as needed for wheezing or shortness of breath.   Yes Historical Provider, MD  pravastatin (PRAVACHOL) 40 MG tablet Take 40 mg by mouth daily.   Yes Historical Provider, MD  quinapril (ACCUPRIL) 40 MG tablet Take 40 mg by mouth 2 (two) times daily.    Yes Historical Provider, MD  Vitamin D, Ergocalciferol, (DRISDOL) 50000 UNITS CAPS capsule Take 50,000 Units by mouth every 7 (seven) days.   Yes Historical Provider, MD  mometasone-formoterol (DULERA) 200-5 MCG/ACT AERO Inhale 2 puffs into the lungs 2 (two)  times daily. Patient not taking: Reported on 04/15/2015 01/03/15   Delfino LovettVipul Shah, MD      VITAL SIGNS:  Blood pressure 179/95, pulse 107, temperature 98.1 F (36.7 C), temperature source Oral, resp. rate 18, weight 236 lb 1 oz (107.077 kg), SpO2 100 %.  PHYSICAL EXAMINATION:   VITAL SIGNS: Filed Vitals:   Jul 20, 2014 2201 Jul 20, 2014 2240  BP: 163/94 179/95  Pulse: 113 107  Temp:    Resp: 16 18   GENERAL:69 y.o.female critically ill given respiratory/mental status  HEAD: Normocephalic, atraumatic.  EYES: Pupils equal, round, sluggish reactive to light. Unable to assess extraocular muscles given mental status/medical condition. No scleral icterus.  MOUTH: Moist mucosal membrane. Dentition intact. No abscess noted.  EAR, NOSE, THROAT: Clear without exudates. No external lesions.  NECK: Supple. No thyromegaly. No nodules. No JVD.  PULMONARY: Coarse breath sounds without wheeze. Scattered rhonchi at the bases, intubated/mechanically ventilated currently on propofol for sedation thick creamy secretions, elevated peak pressures on vent 50-60 after suctioning  CHEST: Nontender to palpation.  CARDIOVASCULAR: S1 and S2. Tachycardic. No murmurs, rubs, or gallops. 2+ edema. Pedal pulses 2+ bilaterally.  GASTROINTESTINAL: Soft, nontender, nondistended. No masses. Positive bowel sounds. No hepatosplenomegaly.  MUSCULOSKELETAL: No swelling, clubbing, or edema. Range of motion full in all extremities.  NEUROLOGIC: Unable to assess given mental status/medical condition SKIN: No ulceration, lesions, rashes, or cyanosis. Skin warm and dry. Turgor intact.  PSYCHIATRIC: Unable to assess given mental status/medical condition      LABORATORY PANEL:   CBC  Recent Labs Lab Jul 20, 2014 2018  WBC 12.7*  HGB 7.3*  HCT 26.1*  PLT 283   ------------------------------------------------------------------------------------------------------------------  Chemistries   Recent Labs Lab Jul 20, 2014 2018  NA 146*   K 3.9  CL 98*  CO2 40*  GLUCOSE 201*  BUN 50*  CREATININE 2.85*  CALCIUM 8.9  AST 26  ALT 27  ALKPHOS 95  BILITOT <0.1*   ------------------------------------------------------------------------------------------------------------------  Cardiac Enzymes  Recent Labs Lab Jul 20, 2014 2018  TROPONINI 0.10*   ------------------------------------------------------------------------------------------------------------------  RADIOLOGY:  Dg Chest 1 View  04/21/2015  CLINICAL DATA:  Confusion, possible dehydration with strong urine odor. Hypoxia. History of COPD, diabetes, CHF, hypertension. EXAM: CHEST 1 VIEW COMPARISON:  CT chest December 28, 2014 FINDINGS: The cardiac silhouette appears moderately enlarged, even with consideration AP technique. Pulmonary vascular congestion interstitial prominence. Patchy consolidation RIGHT mid and lower lung zone, with suspected small pleural effusions. No pneumothorax. Soft tissue planes and included osseous structures are nonsuspicious. IMPRESSION: Increasing cardiomegaly and findings of pulmonary edema. Confluent edema versus pneumonia RIGHT mid and lower lung zone and, small suspected pleural effusions. Recommend followup chest radiograph after treatment to verify improvement. Electronically Signed   By: Awilda Metroourtnay  Bloomer M.D.   On: December 13, 2014 21:03   Dg Chest Portable 1 View  05/07/2015  CLINICAL DATA:  Respiratory failure. EXAM: PORTABLE CHEST 1 VIEW COMPARISON:  December 13, 2014 at 20:24 FINDINGS: Endotracheal tube is 3.6 cm  above the carina. Nasogastric tube extends below the diaphragm and off the inferior edge of the image. Basilar consolidation persists bilaterally and there probably also or small effusions. Ground-glass opacities persist in the central lung regions. No pneumothorax is evident. IMPRESSION: Support equipment appears satisfactorily positioned. No significant interval change in the bilateral airspace opacities. Electronically Signed   By:  Ellery Plunk M.D.   On: 2015-05-09 22:13    EKG:   Orders placed or performed during the hospital encounter of 05/09/15  . ED EKG  . ED EKG  . EKG 12-Lead  . EKG 12-Lead    IMPRESSION AND PLAN:   69 year old African-American female history of COPD on 2 L nasal cane at baseline with likely right lung base cancer refusing further workup presenting with altered mental status and shortness of breath  intubated in emergency department  1. Acute on chronic respiratory failure hypercapnic: Full vent support wean PEEP and FiO2 as tolerated, continue with DuoNeb treatment, steroids. Questionable pneumonia on chest x-ray appears similar to prior films however given tachycardia, leukocytosis, secretions will continue with treatment for pneumonia-she has received vancomycin/Zosyn in emergency department continue these until culture data is return taper antibiotics at that time  2. Elevated troponin: Telemetry, cardiac enzymes 3 if upward trending heparin drip 3. Acute kidney injury on chronic kidney disease: IV fluid hydration and follow renal function/urinary output hold further nephrotoxic agents 4. Type 2 diabetes non-insulin-requiring: Hold oral agents place on insulin sliding scale with Accu-Cheks 5. Venous embolism prophylactic: Heparin subcutaneous        All the records are reviewed and case discussed with ED provider. Management plans discussed with the patient, family and they are in agreement.  CODE STATUS: Full  TOTAL TIME TAKING CARE OF THIS PATIENT: 45 critical care minutes.    Yechezkel Fertig,  Mardi Mainland.D on May 09, 2015 at 10:55 PM  Between 7am to 6pm - Pager - [phone]  After 6pm: House Pager: - [phone]  [facility]  Office  [phone]  CC: Primary care physician; Sherrie Mustache, MD

## 2015-04-14 NOTE — ED Notes (Addendum)
Pt taken off non-rebreather mask and placed on Bipap by RT.

## 2015-04-14 NOTE — ED Notes (Signed)
Pt placed on non rebreather at 15lpm for pox of 84% on oxygen 6lpm via [place]. Pt with rebound pox to 94%. md notified of pt arrival2016

## 2015-04-14 NOTE — ED Notes (Signed)
Per ems, pt's family states pt with confusion today and "strong urine odor". Pt arrives confused, moves all extremities, perrl 3mm. Strong odor noted to pt. Pt wears oxygen at 2lpm via [place], ems states pt with pox on oxygen of 60s, increased to 6lpm via [place] per ems with pox "improving".

## 2015-04-14 NOTE — ED Provider Notes (Signed)
[facility] Emergency Department Provider Note  ____________________________________________  Time seen: Approximately 8:31 PM  I have reviewed the triage vital signs and the nursing notes.   HISTORY  Chief Complaint Altered Mental Status  History is limited by altered mental status and severe acute disease  HPI Rhonda Garner is a 69 y.o. female with an extensive past medical history includes COPD on 4-6 L of oxygen at baseline, CHF, and a diagnosis of lung cancer for which she is been seen recently at the cancer center.  She is reportedly staying with family recently and EMS was called tonight for worsening mental status.  The family reported to EMS that she has been increasingly confused and altered over the last 1-2 days.  Upon arrival she will generally look around and is in no acute distress but is hypoxemic even on 6 L of oxygen at 84% and she was put on a nonrebreather in the ED.  She smells strongly of urine.  She moves all of her extremities even though she is clearly confused.Her presentation is critical at this time given her abnormal vital signs and her ill appearance.   Past Medical History  Diagnosis Date  . COPD (chronic obstructive pulmonary disease) (HCC)   . Chronic respiratory failure (HCC)   . Diabetes mellitus without complication (HCC)   . Chronic diastolic heart failure (HCC)   . Hypertension   . 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 Acute hypercapnic respiratory failure (HCC) 2014-10-26  . Congestive heart failure (HCC) 04/10/2015  . Arthritis 01/25/2015  . CCF (congestive cardiac failure) (HCC) 01/25/2015  . Chronic kidney disease 01/25/2015  . OP (osteoporosis) 01/25/2015  . Adjustment disorder with anxious mood   . Acute on chronic respiratory failure (HCC) 12/27/2014  . Lung mass 12/27/2014  .  Hypokalemia 12/27/2014  . Absolute anemia 11/30/2013  . Chronic renal failure 11/30/2013  . Diabetes (HCC) 11/30/2013  . Elevated WBC count 11/30/2013  . HLD (hyperlipidemia) 11/30/2013  . Obstructive apnea 11/30/2013  . Diabetes mellitus (HCC) 11/30/2013  . COPD, moderate (HCC) 11/19/2013  . Moderate COPD (chronic obstructive pulmonary disease) (HCC) 11/19/2013  . Chronic kidney disease (CKD), stage III (moderate) 10/21/2013  . Chronic diastolic heart failure (HCC)   . Hypertension   . Heart failure, systolic (HCC) 04/15/2012  . Systolic heart failure (HCC) 04/15/2012  . Cataract cortical, senile 07/15/2011  . Cataract, nuclear sclerotic senile 07/15/2011  . Cardiac disease 10/29/2006  . Hypercholesteremia 06/10/2005  . Hypercholesterolemia 06/10/2005  . CAFL (chronic airflow limitation) (HCC) 10/28/2003  . Chronic obstructive pulmonary disease (HCC) 10/28/2003  . Diabetes mellitus type 2, uncontrolled (HCC) 04/12/2000  . Type 2 diabetes mellitus (HCC) 04/12/2000    Past Surgical History  Procedure Laterality Date  . Cholecystectomy      No current outpatient prescriptions on file.  Allergies Nickel and Simvastatin  Family History  Problem Relation Age of Onset  . Hypertension Mother   . Hypertension Father     Social History Social History  Substance Use Topics  . Smoking status: Former Smoker -- 3.00 packs/day for 20 years  . Smokeless tobacco: None  . Alcohol Use: No    Review of Systems Unable to obtain due to altered mental status ____________________________________________   PHYSICAL EXAM:  VITAL SIGNS: ED Triage Vitals  Enc Vitals Group     BP  04/27/2015 2010 111/67 mmHg     Pulse Rate 04/27/15 2010 114     Resp 04/27/2015 2010 24     Temp 04/27/15 2010 98.1 F (36.7 C)     Temp Source 2015/04/27 2010 Oral     SpO2 April 27, 2015 2010 85 %     Weight 04-27-2015 2010 236 lb 1 oz (107.077 kg)     Height --      Head Cir --      Peak Flow --      Pain  Score --      Pain Loc --      Pain Edu? --      Excl. in GC? --     Constitutional: Somnolent, opens her eyes to voice and to painful stimuli but otherwise is altered Eyes: Conjunctivae are normal. PERRL. EOMI. Head: Atraumatic. Nose: No congestion/rhinnorhea. Mouth/Throat: Mucous membranes are moist.  Oropharynx non-erythematous. Neck: No stridor.   Cardiovascular: Tachycardia between 110 and 120, regular rhythm. Grossly normal heart sounds.  Good peripheral circulation. Respiratory: Normal respiratory effort but hypoxemia in the low to mid 80s on 6 L, improved to the 90s on nonrebreather. Gastrointestinal: Obese, Soft and nontender. No distention. No abdominal bruits. No CVA tenderness. Musculoskeletal: 1+ pitting edema in the lower extremities.  No obvious injuries or deformities Neurologic:  Moves all 4 extremities but is obtunded. Skin:  Skin is warm, dry and intact. No rash noted.   ____________________________________________   LABS (all labs ordered are listed, but only abnormal results are displayed)  Labs Reviewed  COMPREHENSIVE METABOLIC PANEL - Abnormal; Notable for the following:    Sodium 146 (*)    Chloride 98 (*)    CO2 40 (*)    Glucose, Bld 201 (*)    BUN 50 (*)    Creatinine, Ser 2.85 (*)    Total Bilirubin <0.1 (*)    GFR calc non Af Amer 16 (*)    GFR calc Af Amer 19 (*)    All other components within normal limits  BRAIN NATRIURETIC PEPTIDE - Abnormal; Notable for the following:    B Natriuretic Peptide 1014.0 (*)    All other components within normal limits  URINALYSIS COMPLETEWITH MICROSCOPIC ([facility] ONLY) - Abnormal; Notable for the following:    Color, Urine YELLOW (*)    APPearance HAZY (*)    Hgb urine dipstick 1+ (*)    Protein, ur 100 (*)    Bacteria, UA RARE (*)    Squamous Epithelial / LPF 0-5 (*)    All other components within normal limits  TROPONIN I - Abnormal; Notable for the following:    Troponin I 0.10 (*)    All other  components within normal limits  GLUCOSE, CAPILLARY - Abnormal; Notable for the following:    Glucose-Capillary 185 (*)    All other components within normal limits  LIPASE, BLOOD - Abnormal; Notable for the following:    Lipase 86 (*)    All other components within normal limits  CBC WITH DIFFERENTIAL/PLATELET - Abnormal; Notable for the following:    WBC 12.7 (*)    RBC 3.57 (*)    Hemoglobin 7.3 (*)    HCT 26.1 (*)    MCV 73.1 (*)    MCH 20.4 (*)    MCHC 27.9 (*)    RDW 18.1 (*)    Neutro Abs 10.2 (*)    Monocytes Absolute 1.2 (*)    All other components within normal limits  BLOOD GAS, ARTERIAL -  Abnormal; Notable for the following:    pH, Arterial 7.16 (*)    pCO2 arterial 130 (*)    pO2, Arterial 192 (*)    Allens test (pass/fail) POSITIVE (*)    All other components within normal limits  BLOOD GAS, ARTERIAL - Abnormal; Notable for the following:    pH, Arterial 7.32 (*)    pCO2 arterial 80 (*)    Bicarbonate 41.2 (*)    Acid-Base Excess 11.5 (*)    Allens test (pass/fail) POSITIVE (*)    All other components within normal limits  CULTURE, BLOOD (ROUTINE X 2)  CULTURE, BLOOD (ROUTINE X 2)  URINE CULTURE  MRSA PCR SCREENING  LACTIC ACID, PLASMA  LACTIC ACID, PLASMA  APTT  PROTIME-INR  BASIC METABOLIC PANEL  CBC  CBG MONITORING, ED   ____________________________________________  EKG  ED ECG REPORT I, Daliyah Sramek, the attending physician, personally viewed and interpreted this ECG.  Date: 04/23/2015 EKG Time: 20:22 Rate: 111 Rhythm: Sinus tachycardia QRS Axis: normal Intervals: normal ST/T Wave abnormalities: Non-specific ST segment / T-wave changes, but no evidence of acute ischemia. Conduction Disutrbances: none Narrative Interpretation: unremarkable  ____________________________________________  RADIOLOGY   Dg Chest 1 View  04/21/2015  CLINICAL DATA:  Confusion, possible dehydration with strong urine odor. Hypoxia. History of COPD, diabetes,  CHF, hypertension. EXAM: CHEST 1 VIEW COMPARISON:  CT chest December 28, 2014 FINDINGS: The cardiac silhouette appears moderately enlarged, even with consideration AP technique. Pulmonary vascular congestion interstitial prominence. Patchy consolidation RIGHT mid and lower lung zone, with suspected small pleural effusions. No pneumothorax. Soft tissue planes and included osseous structures are nonsuspicious. IMPRESSION: Increasing cardiomegaly and findings of pulmonary edema. Confluent edema versus pneumonia RIGHT mid and lower lung zone and, small suspected pleural effusions. Recommend followup chest radiograph after treatment to verify improvement. Electronically Signed   By: Awilda Metro M.D.   On: 04/16/2015 21:03   Dg Chest Portable 1 View  05/02/2015  CLINICAL DATA:  Respiratory failure. EXAM: PORTABLE CHEST 1 VIEW COMPARISON:  05/12/2015 at 20:24 FINDINGS: Endotracheal tube is 3.6 cm above the carina. Nasogastric tube extends below the diaphragm and off the inferior edge of the image. Basilar consolidation persists bilaterally and there probably also or small effusions. Ground-glass opacities persist in the central lung regions. No pneumothorax is evident. IMPRESSION: Support equipment appears satisfactorily positioned. No significant interval change in the bilateral airspace opacities. Electronically Signed   By: Ellery Plunk M.D.   On: 04/27/2015 22:13    ____________________________________________   PROCEDURES  Procedure(s) performed: intubation, see procedure note(s).  INTUBATION Performed by: Loleta Rose  Required items: required blood products, implants, devices, and special equipment available Patient identity confirmed: provided demographic data and hospital-assigned identification number Time out: Immediately prior to procedure a "time out" was called to verify the correct patient, procedure, equipment, support staff and site/side marked as required.  Indications: AMS,  lack of ventilation (pCO2 = 130)  Intubation method: Glidescope Laryngoscopy   Preoxygenation: BiPap Sedatives: 20 mg Etomidate Paralytic: 120 mg Rocuronium  Tube Size: 7.5 cuffed, 23 cm at the teeth  Post-procedure assessment: chest rise and ETCO2 monitor Breath sounds: equal and absent over the epigastrium Tube secured with: ETT holder Chest x-ray interpreted by radiologist and me.  Chest x-ray findings: endotracheal tube in appropriate position  Patient tolerated the procedure well with no immediate complications.   Critical Care performed: Yes, see critical care note(s)  CRITICAL CARE Performed by: Loleta Rose   Total critical care time: 40  minutes  Critical care time was exclusive of separately billable procedures and treating other patients.  Critical care was necessary to treat or prevent imminent or life-threatening deterioration.  Critical care was time spent personally by me on the following activities: development of treatment plan with patient and/or surrogate as well as nursing, discussions with consultants, evaluation of patient's response to treatment, examination of patient, obtaining history from patient or surrogate, ordering and performing treatments and interventions, ordering and review of laboratory studies, ordering and review of radiographic studies, pulse oximetry and re-evaluation of patient's condition.  ____________________________________________   INITIAL IMPRESSION / ASSESSMENT AND PLAN / ED COURSE  Pertinent labs & imaging results that were available during my care of the patient were reviewed by me and considered in my medical decision making (see chart for details).  Critically ill-appearing patient's given her obtundation, known lung and heart disease, and abnormal vitals with hypoxemia, tachypnea, and tachycardia.  We will proceed with sepsis protocol but COPD exacerbation with hypercapnia and/or CHF exacerbation are also both possible  although her lungs sound clear at this time.  Sepsis is my most likely diagnosis currently and I will give empiric antibiotics after cultures and urine are obtained.  ----------------------------------------- 9:05 PM on 04-19-15 -----------------------------------------  PCO2 on blood gas is 130.  We are putting her on BiPAP and I am attempting to contact family to confirm her full CODE STATUS, but according to the most recent medical records I can find she still is full code even though she seems to be refusing treatment for her lung cancer.  I will move towards intubation and I have left a message on her emergency contact person's voicemail to call the emergency department as soon as possible.  ----------------------------------------- 9:54 PM on 04-19-2015 -----------------------------------------  I called all the numbers in the patient's demographic information and had no success reaching family.  Given her obtundation and severely elevated PCO2, I proceeded with intubation as is medically indicated.  She does have some pulmonary edema on the chest x-ray but she also appears volume depleted intravascularly based on her blood pressures and I am giving her a liter of fluids since her airway and breathing are secured with the ventilator.  I have informed the hospitalist of the situation.  She has a pneumonia on the chest x-ray there is no sign of infection at this time but I am continuing with the empiric antibiotics  ____________________________________________  FINAL CLINICAL IMPRESSION(S) / ED DIAGNOSES  Final diagnoses:  Acute respiratory failure with hypoxia and hypercapnia (HCC)  Altered mental status, unspecified altered mental status type  Sepsis, due to unspecified organism (HCC)  Acute on chronic renal insufficiency (HCC)  Acute congestive heart failure, unspecified congestive heart failure type (HCC)      NEW MEDICATIONS STARTED DURING THIS VISIT:  Current Discharge  Medication List       Loleta Rose, MD 04/15/15 0000

## 2015-04-15 ENCOUNTER — Inpatient Hospital Stay: Payer: Medicare Other

## 2015-04-15 DIAGNOSIS — J189 Pneumonia, unspecified organism: Secondary | ICD-10-CM

## 2015-04-15 DIAGNOSIS — J181 Lobar pneumonia, unspecified organism: Secondary | ICD-10-CM

## 2015-04-15 DIAGNOSIS — N189 Chronic kidney disease, unspecified: Secondary | ICD-10-CM | POA: Insufficient documentation

## 2015-04-15 DIAGNOSIS — J9601 Acute respiratory failure with hypoxia: Secondary | ICD-10-CM

## 2015-04-15 DIAGNOSIS — J9602 Acute respiratory failure with hypercapnia: Secondary | ICD-10-CM

## 2015-04-15 DIAGNOSIS — I509 Heart failure, unspecified: Secondary | ICD-10-CM

## 2015-04-15 DIAGNOSIS — N289 Disorder of kidney and ureter, unspecified: Secondary | ICD-10-CM

## 2015-04-15 DIAGNOSIS — R918 Other nonspecific abnormal finding of lung field: Secondary | ICD-10-CM

## 2015-04-15 LAB — FERRITIN: FERRITIN: 9 ng/mL — AB (ref 11–307)

## 2015-04-15 LAB — BLOOD GAS, ARTERIAL
Acid-Base Excess: 6.6 mmol/L — ABNORMAL HIGH (ref 0.0–3.0)
Acid-Base Excess: 7.5 mmol/L — ABNORMAL HIGH (ref 0.0–3.0)
Allens test (pass/fail): POSITIVE — AB
Allens test (pass/fail): POSITIVE — AB
Bicarbonate: 36.2 mEq/L — ABNORMAL HIGH (ref 21.0–28.0)
Bicarbonate: 34.3 mEq/L — ABNORMAL HIGH (ref 21.0–28.0)
Delivery systems: POSITIVE
Expiratory PAP: 6
FIO2: 40
FIO2: 45
Inspiratory PAP: 12
MECHVT: 450 mL
O2 Saturation: 90.3 %
O2 Saturation: 86.4 %
PEEP: 5 cmH2O
Patient temperature: 37
Patient temperature: 37
RATE: 16 resp/min
pCO2 arterial: 77 mmHg (ref 32.0–48.0)
pCO2 arterial: 65 mmHg — ABNORMAL HIGH (ref 32.0–48.0)
pH, Arterial: 7.28 — ABNORMAL LOW (ref 7.350–7.450)
pH, Arterial: 7.33 — ABNORMAL LOW (ref 7.350–7.450)
pO2, Arterial: 67 mmHg — ABNORMAL LOW (ref 83.0–108.0)
pO2, Arterial: 56 mmHg — ABNORMAL LOW (ref 83.0–108.0)

## 2015-04-15 LAB — GLUCOSE, CAPILLARY
GLUCOSE-CAPILLARY: 130 mg/dL — AB (ref 65–99)
GLUCOSE-CAPILLARY: 168 mg/dL — AB (ref 65–99)
Glucose-Capillary: 150 mg/dL — ABNORMAL HIGH (ref 65–99)
Glucose-Capillary: 189 mg/dL — ABNORMAL HIGH (ref 65–99)
Glucose-Capillary: 201 mg/dL — ABNORMAL HIGH (ref 65–99)
Glucose-Capillary: 206 mg/dL — ABNORMAL HIGH (ref 65–99)

## 2015-04-15 LAB — FOLATE: Folate: 9.6 ng/mL (ref >5.9)

## 2015-04-15 LAB — RETICULOCYTES
RBC.: 3.16 MIL/uL — AB (ref 3.80–5.20)
RETIC COUNT ABSOLUTE: 82.2 10*3/uL (ref 19.0–183.0)
RETIC CT PCT: 2.6 % (ref 0.4–3.1)

## 2015-04-15 LAB — TRIGLYCERIDES: TRIGLYCERIDES: 127 mg/dL (ref <150)

## 2015-04-15 LAB — BASIC METABOLIC PANEL
ANION GAP: 5 (ref 5–15)
BUN: 50 mg/dL — ABNORMAL HIGH (ref 6–20)
CO2: 37 mmol/L — AB (ref 22–32)
Calcium: 8.4 mg/dL — ABNORMAL LOW (ref 8.9–10.3)
Chloride: 102 mmol/L (ref 101–111)
Creatinine, Ser: 2.92 mg/dL — ABNORMAL HIGH (ref 0.44–1.00)
GFR calc Af Amer: 18 mL/min — ABNORMAL LOW (ref >60)
GFR calc non Af Amer: 16 mL/min — ABNORMAL LOW (ref >60)
GLUCOSE: 179 mg/dL — AB (ref 65–99)
POTASSIUM: 3.4 mmol/L — AB (ref 3.5–5.1)
Sodium: 144 mmol/L (ref 135–145)

## 2015-04-15 LAB — CBC
HEMATOCRIT: 23.1 % — AB (ref 35.0–47.0)
HEMOGLOBIN: 6.9 g/dL — AB (ref 12.0–16.0)
MCH: 21.6 pg — AB (ref 26.0–34.0)
MCHC: 29.7 g/dL — AB (ref 32.0–36.0)
MCV: 72.7 fL — AB (ref 80.0–100.0)
Platelets: 218 10*3/uL (ref 150–440)
RBC: 3.18 MIL/uL — ABNORMAL LOW (ref 3.80–5.20)
RDW: 18.1 % — ABNORMAL HIGH (ref 11.5–14.5)
WBC: 12.8 10*3/uL — ABNORMAL HIGH (ref 3.6–11.0)

## 2015-04-15 LAB — HEMOGLOBIN A1C: HEMOGLOBIN A1C: 6.9 % — AB (ref 4.0–6.0)

## 2015-04-15 LAB — TROPONIN I
Troponin I: 0.13 ng/mL — ABNORMAL HIGH (ref <0.031)
Troponin I: 0.12 ng/mL — ABNORMAL HIGH (ref <0.031)
Troponin I: 0.09 ng/mL — ABNORMAL HIGH (ref <0.031)

## 2015-04-15 LAB — PREPARE RBC (CROSSMATCH)

## 2015-04-15 LAB — IRON AND TIBC
Iron: 9 ug/dL — ABNORMAL LOW (ref 28–170)
Saturation Ratios: 3 % — ABNORMAL LOW (ref 10.4–31.8)
TIBC: 297 ug/dL (ref 250–450)
UIBC: 288 ug/dL

## 2015-04-15 LAB — MRSA PCR SCREENING: MRSA BY PCR: NEGATIVE

## 2015-04-15 LAB — ABO/RH: ABO/RH(D): O POS

## 2015-04-15 MED ORDER — ASPIRIN EC 81 MG PO TBEC
81.0000 mg | DELAYED_RELEASE_TABLET | Freq: Every day | ORAL | Status: DC
  Administered 2015-04-15: 81 mg via ORAL
  Filled 2015-04-15 (×2): qty 1

## 2015-04-15 MED ORDER — FLUTICASONE PROPIONATE 50 MCG/ACT NA SUSP
2.0000 | Freq: Every day | NASAL | Status: DC
  Filled 2015-04-15: qty 16

## 2015-04-15 MED ORDER — DOCUSATE SODIUM 50 MG/5ML PO LIQD: 100.0000 mg | Freq: Two times a day (BID) | ORAL | Status: DC | PRN

## 2015-04-15 MED ORDER — VANCOMYCIN HCL 10 G IV SOLR
1250.0000 mg | INTRAVENOUS | Status: DC
  Administered 2015-04-15 – 2015-04-16 (×2): 1250 mg via INTRAVENOUS
  Filled 2015-04-15 (×2): qty 1250

## 2015-04-15 MED ORDER — PIPERACILLIN-TAZOBACTAM 3.375 G IVPB
3.3750 g | Freq: Three times a day (TID) | INTRAVENOUS | Status: DC
  Administered 2015-04-15 – 2015-04-17 (×7): 3.375 g via INTRAVENOUS
  Filled 2015-04-15 (×11): qty 50

## 2015-04-15 MED ORDER — MIDAZOLAM HCL 2 MG/2ML IJ SOLN
INTRAMUSCULAR | Status: AC
  Administered 2015-04-15: 4 mg
  Filled 2015-04-15: qty 4

## 2015-04-15 MED ORDER — PRAVASTATIN SODIUM 20 MG PO TABS
40.0000 mg | ORAL_TABLET | Freq: Every day | ORAL | Status: DC
  Administered 2015-04-15 – 2015-04-17 (×3): 40 mg via ORAL
  Filled 2015-04-15 (×3): qty 2

## 2015-04-15 MED ORDER — INSULIN ASPART 100 UNIT/ML ~~LOC~~ SOLN: 0.0000 [IU] | Freq: Every day | SUBCUTANEOUS | Status: DC

## 2015-04-15 MED ORDER — INSULIN ASPART 100 UNIT/ML ~~LOC~~ SOLN
0.0000 [IU] | Freq: Three times a day (TID) | SUBCUTANEOUS | Status: DC
  Administered 2015-04-15 (×2): 1 [IU] via SUBCUTANEOUS
  Filled 2015-04-15 (×2): qty 1

## 2015-04-15 MED ORDER — VECURONIUM BROMIDE 10 MG IV SOLR
INTRAVENOUS | Status: AC
  Administered 2015-04-15: 10 mg
  Filled 2015-04-15: qty 10

## 2015-04-15 MED ORDER — SODIUM CHLORIDE 0.9 % IV SOLN
Freq: Once | INTRAVENOUS | Status: AC
  Administered 2015-04-17: 02:00:00 via INTRAVENOUS

## 2015-04-15 MED ORDER — PROPOFOL 1000 MG/100ML IV EMUL
5.0000 ug/kg/min | INTRAVENOUS | Status: DC
  Administered 2015-04-14: 20 ug/kg/min via INTRAVENOUS
  Administered 2015-04-15: 40 ug/kg/min via INTRAVENOUS
  Administered 2015-04-15: 30 ug/kg/min via INTRAVENOUS
  Administered 2015-04-15: 40 ug/kg/min via INTRAVENOUS
  Administered 2015-04-15: 30 ug/kg/min via INTRAVENOUS
  Filled 2015-04-15 (×2): qty 100

## 2015-04-15 MED ORDER — MIDAZOLAM HCL 2 MG/2ML IJ SOLN
1.0000 mg | INTRAMUSCULAR | Status: DC | PRN
  Administered 2015-04-16 (×3): 1 mg via INTRAVENOUS
  Filled 2015-04-15 (×3): qty 2

## 2015-04-15 MED ORDER — IPRATROPIUM-ALBUTEROL 0.5-2.5 (3) MG/3ML IN SOLN
3.0000 mL | RESPIRATORY_TRACT | Status: DC
  Administered 2015-04-15 – 2015-04-17 (×14): 3 mL via RESPIRATORY_TRACT
  Filled 2015-04-15 (×14): qty 3

## 2015-04-15 MED ORDER — FENTANYL 2500MCG IN NS 250ML (10MCG/ML) PREMIX INFUSION
25.0000 ug/h | INTRAVENOUS | Status: DC
  Administered 2015-04-15: 25 ug/h via INTRAVENOUS
  Administered 2015-04-15: 200 ug/h via INTRAVENOUS
  Administered 2015-04-15: 100 ug/h via INTRAVENOUS
  Filled 2015-04-15 (×2): qty 250

## 2015-04-15 MED ORDER — FENTANYL CITRATE (PF) 100 MCG/2ML IJ SOLN
50.0000 ug | Freq: Once | INTRAMUSCULAR | Status: AC
  Administered 2015-04-15: 50 ug via INTRAVENOUS

## 2015-04-15 MED ORDER — VITAMIN D (ERGOCALCIFEROL) 1.25 MG (50000 UNIT) PO CAPS: 50000.0000 [IU] | ORAL_CAPSULE | ORAL | Status: DC

## 2015-04-15 MED ORDER — IPRATROPIUM-ALBUTEROL 20-100 MCG/ACT IN AERS: 1.0000 | INHALATION_SPRAY | Freq: Three times a day (TID) | RESPIRATORY_TRACT | Status: DC

## 2015-04-15 MED ORDER — AMLODIPINE BESYLATE 5 MG PO TABS: 5.0000 mg | ORAL_TABLET | Freq: Every day | ORAL | Status: DC

## 2015-04-15 MED ORDER — BUDESONIDE-FORMOTEROL FUMARATE 160-4.5 MCG/ACT IN AERO
2.0000 | INHALATION_SPRAY | Freq: Two times a day (BID) | RESPIRATORY_TRACT | Status: DC
  Filled 2015-04-15: qty 6

## 2015-04-15 MED ORDER — MIDAZOLAM HCL 2 MG/2ML IJ SOLN
1.0000 mg | INTRAMUSCULAR | Status: AC | PRN
  Administered 2015-04-15 (×3): 1 mg via INTRAVENOUS
  Filled 2015-04-15 (×2): qty 2

## 2015-04-15 MED ORDER — FENTANYL BOLUS VIA INFUSION
25.0000 ug | INTRAVENOUS | Status: DC | PRN
  Filled 2015-04-15: qty 25

## 2015-04-15 MED ORDER — SODIUM CHLORIDE 0.9 % IV BOLUS (SEPSIS)
500.0000 mL | Freq: Once | INTRAVENOUS | Status: AC
  Administered 2015-04-15: 500 mL via INTRAVENOUS

## 2015-04-15 MED ORDER — METHYLPREDNISOLONE SODIUM SUCC 125 MG IJ SOLR
60.0000 mg | INTRAMUSCULAR | Status: DC
  Administered 2015-04-15 – 2015-04-17 (×3): 60 mg via INTRAVENOUS
  Filled 2015-04-15 (×3): qty 2

## 2015-04-15 MED ORDER — INSULIN ASPART 100 UNIT/ML ~~LOC~~ SOLN
2.0000 [IU] | SUBCUTANEOUS | Status: DC
  Administered 2015-04-15: 4 [IU] via SUBCUTANEOUS
  Administered 2015-04-15: 6 [IU] via SUBCUTANEOUS
  Administered 2015-04-16: 4 [IU] via SUBCUTANEOUS
  Administered 2015-04-16: 6 [IU] via SUBCUTANEOUS
  Administered 2015-04-16: 4 [IU] via SUBCUTANEOUS
  Administered 2015-04-16: 2 [IU] via SUBCUTANEOUS
  Administered 2015-04-16: 4 [IU] via SUBCUTANEOUS
  Administered 2015-04-16: 6 [IU] via SUBCUTANEOUS
  Administered 2015-04-17 (×3): 4 [IU] via SUBCUTANEOUS
  Filled 2015-04-15 (×2): qty 4
  Filled 2015-04-15: qty 6
  Filled 2015-04-15 (×2): qty 4
  Filled 2015-04-15: qty 2
  Filled 2015-04-15 (×2): qty 6
  Filled 2015-04-15 (×3): qty 4

## 2015-04-15 MED ORDER — SENNOSIDES-DOCUSATE SODIUM 8.6-50 MG PO TABS: 1.0000 | ORAL_TABLET | Freq: Two times a day (BID) | ORAL | Status: DC | PRN

## 2015-04-15 NOTE — Progress Notes (Addendum)
Contacted [facility] to report that patient's MAP has been between 59-63, with a 24 hour urine output of 280 ml. Continued on Fentanyl at 200 mcg, had a 50 mcg bolus since pt was having  a difficult time being sedated after being awakened. Vent setting was recently changed per MD order. B/P low but starting to come up at 92/56 with a current map of 68. Started on first unit of blood transfusion at 1958, will continue to monitor progress, and watch for fluid overload. Does have dependent edema in upper extremities.

## 2015-04-15 NOTE — Progress Notes (Signed)
Pt self extubated at 1550. Pt was alert and following commansd at time of extubation. Fentanyl drip was stopped. [facility] notified. STAT ABG was ordered. Pt became increasingly lethargic and placed on BiPap until Dr. Dema SeverinMungal arrived to re-intubate. With RT present, Desma MaximPam Allen, RN; Luther BradleyBrittney Vetta Couzens, RN present, Pt was sedated and intubated. An OG tube was also placed. X-Ray confirmed placement of ET tube and OG tube per Dr. Dema SeverinMungal.

## 2015-04-15 NOTE — Procedures (Signed)
Procedure Note:  Orotracheal Intubation Emergent intubation - Patient self extubated  Implied consent due to emergent nature of patient's condition. Correct Patient, Name & ID confirmed.  The patient was pre-oxygenated and then, under direct visualization, a 7.5 mm cuffed endotracheal tube was placed through the vocal cords into the trachea, using the Glidescope.  Total attempts made 1, using Glidescope, moderate thick secretions noted around the vocal cords. During intubation an assistant applied gentle pressure to the cricoid cartilage.  Position confirmed by auscultation of lungs (good breath sounds bilaterally) and no stomach sounds.  Tube secured at 24 cm. Pulse ox 99 %. CO2 detector in place with appropriate color change.   Pt tolerated procedure well.  No complications were noted.   CXR ordered.   Stephanie AcreVishal Amie Cowens, MD [place] Pager [phone] On Call Pager - [phone] (please enter 7 [phone])

## 2015-04-15 NOTE — Progress Notes (Addendum)
[facility] Physician-Brief Progress Note Patient Name: Rhonda Garner DOB: Jul 18, 1945 MRN: [id number]   Date of Service  04/15/2015  HPI/Events of Note  69 yo female with PMH of COPD on home O2 and RLL lung mass which she has refused w/u or treatment for. [facility] failed Bi[facility] >> intubated and mechancially ventilated. Current medical management includes Pepcid, Heparin Anderson, Propofol IV infusion, Morphine IV,  Vancomycin and Zosyn. ABG on 50%/PRVC 16/TV 450/P 5 = 7.32/80/85/41.2. [facility].   eICU Interventions  Continue present management.      Intervention Category Evaluation Type: New Patient Evaluation  [name],[name] 04/15/2015, 1:20 AM

## 2015-04-15 NOTE — Consult Note (Signed)
PULMONARY / CRITICAL CARE MEDICINE   Name: Rhonda Garner MRN: [id number] DOB: [birth year]    ADMISSION DATE:  04/22/2015 CONSULTATION DATE:  04/15/15  REFERRING MD :  Rozanna Boer  CHIEF COMPLAINT:     AMS, SOB   HISTORY OF PRESENT ILLNESS   69 y.o. female with a known history of COPD chronic respiratory failure 2 L nasal cannula Baseline, right lower lobe lung mass highly suspicious for cancer but patient refuses further workup or treatment who is presenting with altered mental status. Follow with Dr. Meredeth Ide as an outpatient. Patient unable to provide history, meaningful information given intubation. History obtained from chart as well as nursing staff.  Per report EMS states originally called for patient difficulty standing when they assessed her the noted her to be confused and she desaturated to 61% while ambulating. In the emergency department noted to be lethargic/confused placed on BiPAP failed BiPAP therapy required intubation.  Review of chart shows that she's been followed extensively by her pulmonologist and by hematology/oncology, she has a increasing right lower lobe mass that has been known for the past one year, but has refused further workup via bronchoscopy or biopsy. She does have a positive PET scan.  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    SIGNIFICANT EVENTS   12/2014>>RLL mass, seen an evaluated by Heme/Onc 11/28>>seen by Heme/Onc, has worsening RLL mass, but still refusing any further w/u or treatment, discharged from Heme/Onc practice.  12/2>> intubated in [facility] ED for respiratory failure and AMS  PAST MEDICAL HISTORY    :  Past Medical History  Diagnosis Date  . COPD (chronic obstructive pulmonary disease) (HCC)   . Chronic respiratory failure (HCC)   . Diabetes mellitus without complication (HCC)   . Chronic diastolic heart  failure (HCC)   . Hypertension   . 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 Cholecystectomy     Prior to Admission medications   Medication Sig Start Date End Date Taking? Authorizing Provider  acetaminophen (TYLENOL) 325 MG tablet Take 325-650 mg by mouth every 4 (four) hours as needed for mild pain or headache.    Yes Historical Provider, MD  amLODipine (NORVASC) 5 MG tablet Take 5 mg by mouth daily.   Yes Historical Provider, MD  aspirin EC 81 MG tablet Take 81 mg by mouth daily.   Yes Historical Provider, MD  budesonide-formoterol (SYMBICORT) 160-4.5 MCG/ACT inhaler Inhale 2 puffs into the lungs 2 (two) times daily.   Yes Historical Provider, MD  COMBIVENT RESPIMAT 20-100 MCG/ACT AERS respimat Inhale 1 puff into the lungs 3 (three) times daily.    Yes Historical Provider, MD  fluticasone (FLONASE) 50 MCG/ACT nasal spray Place 2 sprays into both nostrils daily.   Yes Historical Provider, MD  furosemide (LASIX) 40 MG tablet Take 40-80 mg by mouth 2 (two) times daily. Pt takes two tablets in the morning and one tablet in the evening.   Yes Historical Provider, MD  glipiZIDE (GLUCOTROL XL) 5 MG 24 hr tablet Take 5 mg by mouth 2 (two) times daily.   Yes Historical Provider, MD  levalbuterol (XOPENEX) 1.25 MG/3ML nebulizer solution Take 1.25 mg by nebulization 4 (four) times daily as needed for wheezing or shortness of breath.   Yes Historical  Provider, MD  pravastatin (PRAVACHOL) 40 MG tablet Take 40 mg by mouth daily.   Yes Historical Provider, MD  quinapril (ACCUPRIL) 40 MG tablet Take 40 mg by mouth 2 (two) times daily.    Yes Historical Provider, MD  Vitamin D, Ergocalciferol, (DRISDOL) 50000 UNITS CAPS capsule Take 50,000 Units by mouth every 7 (seven) days.   Yes Historical Provider, MD  mometasone-formoterol (DULERA) 200-5 MCG/ACT AERO Inhale 2 puffs into the lungs 2  (two) times daily. Patient not taking: Reported on 2015/05/04 01/03/15   Delfino Lovett, MD   Allergies  Allergen Reactions  . Nickel Other (See Comments)    Reaction:  Unknown   . Simvastatin Other (See Comments)    Reaction:  Unknown      FAMILY HISTORY   Family History  Problem Relation Age of Onset  . Hypertension Mother   . Hypertension Father       SOCIAL HISTORY    reports that she has quit smoking. She does not have any smokeless tobacco history on file. She reports that she does not drink alcohol or use illicit drugs.  Review of Systems  Unable to perform ROS: intubated      VITAL SIGNS    Temp:  [98.1 F (36.7 C)-99.4 F (37.4 C)] 98.7 F (37.1 C) (12/03 0600) Pulse Rate:  [81-117] 85 (12/03 0600) Resp:  [13-33] 16 (12/03 0600) BP: (76-179)/(49-122) 76/52 mmHg (12/03 0600) SpO2:  [85 %-100 %] 96 % (12/03 0600) FiO2 (%):  [45 %-75 %] 45 % (12/03 0739) Weight:  [235 lb 3.7 oz (106.7 kg)-236 lb 1 oz (107.077 kg)] 235 lb 3.7 oz (106.7 kg) (12/02 2359) HEMODYNAMICS:   VENTILATOR SETTINGS: Vent Mode:  [-] PRVC FiO2 (%):  [45 %-75 %] 45 % Set Rate:  [16 bmp] 16 bmp Vt Set:  [450 mL] 450 mL PEEP:  [5 cmH20] 5 cmH20 INTAKE / OUTPUT:  Intake/Output Summary (Last 24 hours) at 04/15/15 0801 Last data filed at 04/15/15 0600  Gross per 24 hour  Intake 894.89 ml  Output    270 ml  Net 624.89 ml       PHYSICAL EXAM   Physical Exam  Constitutional: She appears well-developed and well-nourished.  HENT:  Head: Normocephalic and atraumatic.  Right Ear: External ear normal.  Left Ear: External ear normal.  Mouth/Throat: Oropharynx is clear and moist.  Neck: Neck supple. No tracheal deviation present. No thyromegaly present.  Cardiovascular: Normal rate, normal heart sounds and intact distal pulses.  Exam reveals no friction rub.   No murmur heard. Pulmonary/Chest: No stridor. She has no wheezes. She has no rales.  Intubated, dec BS at the right  base Coarse upper airway sounds  Abdominal: Soft. Bowel sounds are normal. She exhibits no distension.  Musculoskeletal: She exhibits no edema.  Neurological:  Intubated and sedated  Skin: Skin is warm and dry.       LABS   LABS:  CBC  Recent Labs Lab May 04, 2015 2018 04/15/15 0513  WBC 12.7* 12.8*  HGB 7.3* 6.9*  HCT 26.1* 23.1*  PLT 283 218   Coag's  Recent Labs Lab 2015/05/04 2018  APTT 29  INR 1.11   BMET  Recent Labs Lab May 04, 2015 2018 04/15/15 0513  NA 146* 144  K 3.9 3.4*  CL 98* 102  CO2 40* 37*  BUN 50* 50*  CREATININE 2.85* 2.92*  GLUCOSE 201* 179*   Electrolytes  Recent Labs Lab May 04, 2015 2018 04/15/15 0513  CALCIUM 8.9 8.4*  Sepsis Markers  Recent Labs Lab Sep 28, 2014 2038 Sep 28, 2014 2323  LATICACIDVEN 1.0 1.0   ABG  Recent Labs Lab Sep 28, 2014 2049 Sep 28, 2014 2309  PHART 7.16* 7.32*  PCO2ART 130* 80*  PO2ART 192* 85   Liver Enzymes  Recent Labs Lab Sep 28, 2014 2018  AST 26  ALT 27  ALKPHOS 95  BILITOT <0.1*  ALBUMIN 3.5   Cardiac Enzymes  Recent Labs Lab Sep 28, 2014 2018  TROPONINI 0.10*   Glucose  Recent Labs Lab Sep 28, 2014 2013 04/15/15 0728  GLUCAP 185* 150*     Recent Results (from the past 240 hour(s))  Urine culture     Status: None (Preliminary result)   Collection Time: 04/13/15  9:49 PM  Result Value Ref Range Status   Specimen Description URINE, RANDOM  Final   Special Requests NONE  Final   Culture NO GROWTH < 12 HOURS  Final   Report Status PENDING  Incomplete  Blood culture (routine x 2)     Status: None (Preliminary result)   Collection Time: Sep 28, 2014  8:18 PM  Result Value Ref Range Status   Specimen Description BLOOD RIGHT HAND  Final   Special Requests BOTTLES DRAWN AEROBIC AND ANAEROBIC 4CC  Final   Culture NO GROWTH < 12 HOURS  Final   Report Status PENDING  Incomplete  Blood culture (routine x 2)     Status: None (Preliminary result)   Collection Time: Sep 28, 2014  8:18 PM  Result Value Ref  Range Status   Specimen Description BLOOD RIGHT ASSIST CONTROL  Final   Special Requests BOTTLES DRAWN AEROBIC AND ANAEROBIC 4CC  Final   Culture NO GROWTH < 12 HOURS  Final   Report Status PENDING  Incomplete  MRSA PCR Screening     Status: None   Collection Time: Sep 28, 2014 11:53 PM  Result Value Ref Range Status   MRSA by PCR NEGATIVE NEGATIVE Final    Comment:        The GeneXpert MRSA Assay (FDA approved for NASAL specimens only), is one component of a comprehensive MRSA colonization surveillance program. It is not intended to diagnose MRSA infection nor to guide or monitor treatment for MRSA infections.      Current facility-administered medications:  .  0.9 %  sodium chloride infusion, , Intravenous, Continuous, Wyatt Hasteavid K Hower, MD, Last Rate: 100 mL/hr at Sep 28, 2014 2326, 100 mL/hr at Sep 28, 2014 2326 .  acetaminophen (TYLENOL) tablet 650 mg, 650 mg, Oral, Q6H PRN **OR** acetaminophen (TYLENOL) suppository 650 mg, 650 mg, Rectal, Q6H PRN, Wyatt Hasteavid K Hower, MD .  amLODipine (NORVASC) tablet 5 mg, 5 mg, Oral, Daily, Wyatt Hasteavid K Hower, MD .  antiseptic oral rinse solution (CORINZ), 7 mL, Mouth Rinse, QID, Wyatt Hasteavid K Hower, MD, 7 mL at 04/15/15 0400 .  aspirin EC tablet 81 mg, 81 mg, Oral, Daily, Wyatt Hasteavid K Hower, MD .  budesonide-formoterol (SYMBICORT) 160-4.5 MCG/ACT inhaler 2 puff, 2 puff, Inhalation, BID, Wyatt Hasteavid K Hower, MD .  chlorhexidine gluconate (PERIDEX) 0.12 % solution 15 mL, 15 mL, Mouth Rinse, BID, Wyatt Hasteavid K Hower, MD, 15 mL at 04/15/15 0140 .  famotidine (PEPCID) IVPB 20 mg premix, 20 mg, Intravenous, Q24H, Wyatt Hasteavid K Hower, MD, 20 mg at 04/15/15 0139 .  fluticasone (FLONASE) 50 MCG/ACT nasal spray 2 spray, 2 spray, Each Nare, Daily, Wyatt Hasteavid K Hower, MD .  heparin injection 5,000 Units, 5,000 Units, Subcutaneous, 3 times per day, Wyatt Hasteavid K Hower, MD, 5,000 Units at 04/15/15 0139 .  insulin aspart (novoLOG) injection 0-5 Units, 0-5 Units, Subcutaneous, QHS,  Wyatt Haste, MD .  insulin aspart  (novoLOG) injection 0-9 Units, 0-9 Units, Subcutaneous, TID WC, Wyatt Haste, MD .  ipratropium-albuterol (DUONEB) 0.5-2.5 (3) MG/3ML nebulizer solution 3 mL, 3 mL, Nebulization, Q4H, Wyatt Haste, MD, 3 mL at 04/15/15 0758 .  methylPREDNISolone sodium succinate (SOLU-MEDROL) 125 mg/2 mL injection 60 mg, 60 mg, Intravenous, Q24H, Wyatt Haste, MD .  morphine 2 MG/ML injection 2 mg, 2 mg, Intravenous, Q4H PRN, Wyatt Haste, MD .  ondansetron [facility]) tablet 4 mg, 4 mg, Oral, Q6H PRN **OR** ondansetron (ZOFRAN) injection 4 mg, 4 mg, Intravenous, Q6H PRN, Wyatt Haste, MD .  oxyCODONE (Oxy IR/ROXICODONE) immediate release tablet 5 mg, 5 mg, Oral, Q4H PRN, Wyatt Haste, MD .  piperacillin-tazobactam (ZOSYN) IVPB 3.375 g, 3.375 g, Intravenous, 3 times per day, Loleta Rose, MD, 3.375 g at 04/15/15 0139 .  pravastatin (PRAVACHOL) tablet 40 mg, 40 mg, Oral, Daily, Wyatt Haste, MD .  propofol (DIPRIVAN) 1000 MG/100ML infusion, 5-80 mcg/kg/min, Intravenous, Titrated, Wyatt Haste, MD, Last Rate: 19.2 mL/hr at 04/15/15 0617, 30 mcg/kg/min at 04/15/15 0617 .  sodium chloride 0.9 % injection 3 mL, 3 mL, Intravenous, Q12H, Wyatt Haste, MD, 3 mL at 04/15/15 0141 .  vancomycin (VANCOCIN) 1,250 mg in sodium chloride 0.9 % 250 mL IVPB, 1,250 mg, Intravenous, Q36H, Loleta Rose, MD .  Melene Muller ON 04/21/2015] Vitamin D (Ergocalciferol) (DRISDOL) capsule 50,000 Units, 50,000 Units, Oral, Q7 days, Wyatt Haste, MD  IMAGING    Dg Chest 1 View  04/24/2015  CLINICAL DATA:  Confusion, possible dehydration with strong urine odor. Hypoxia. History of COPD, diabetes, CHF, hypertension. EXAM: CHEST 1 VIEW COMPARISON:  CT chest December 28, 2014 FINDINGS: The cardiac silhouette appears moderately enlarged, even with consideration AP technique. Pulmonary vascular congestion interstitial prominence. Patchy consolidation RIGHT mid and lower lung zone, with suspected small pleural effusions. No pneumothorax. Soft tissue  planes and included osseous structures are nonsuspicious. IMPRESSION: Increasing cardiomegaly and findings of pulmonary edema. Confluent edema versus pneumonia RIGHT mid and lower lung zone and, small suspected pleural effusions. Recommend followup chest radiograph after treatment to verify improvement. Electronically Signed   By: Awilda Metro M.D.   On: 05/10/2015 21:03   Dg Chest Portable 1 View  05/13/2015  CLINICAL DATA:  Respiratory failure. EXAM: PORTABLE CHEST 1 VIEW COMPARISON:  04/19/2015 at 20:24 FINDINGS: Endotracheal tube is 3.6 cm above the carina. Nasogastric tube extends below the diaphragm and off the inferior edge of the image. Basilar consolidation persists bilaterally and there probably also or small effusions. Ground-glass opacities persist in the central lung regions. No pneumothorax is evident. IMPRESSION: Support equipment appears satisfactorily positioned. No significant interval change in the bilateral airspace opacities. Electronically Signed   By: Ellery Plunk M.D.   On: 05/04/2015 22:13      Indwelling Urinary Catheter continued, requirement due to   Reason to continue Indwelling Urinary Catheter for strict Intake/Output monitoring for hemodynamic instability   Central Line continued, requirement due to   Reason to continue [employer] Monitoring of central venous pressure or other hemodynamic parameters   Ventilator continued, requirement due to, resp failure    Ventilator Sedation RASS 0 to -2   Cultures: BCx2 12/2>> UC  Sputum   Antibiotics: Vanc 12/2>> Zosyn 12/2>>  Lines:   ASSESSMENT/PLAN  69 year old female with past medical history of right lower lobe mass suspicious for lung cancer, COPD, COPD, diabetes, hypertension, seen in consultation for acute on  chronic respiratory failure  PULMONARY OETT 12/2 in the ED Acute respiratory failure Post obstructive pneumonia Right lower lobe mass Mechanical ventilator dependence Small  pleural effusions Chronic respiratory failure on 2 L nasal cannula COPD P:   -Continue with mechanical ventilation, wean as tolerated -ABG as needed -Maintain O2 saturation greater than 80% -PAD protocol, RASS Goal -1 -Patient has adamantly refused any further workup for her right lower lobe mass, and has been discharged from hematology/oncology practice -Continue antibiotics for post obstructive pneumonia -Small pleural effusions are most likely secondary to post obstructive pneumonia and mild CHF -Palliative care consult to discuss goals of care  CARDIOVASCULAR A:  Diastolic CHF History of hypertension P:  -continue monitor blood pressure -Currently running hypotensive, hold any antihypertensive medication -Maintain map>65   RENAL A:   CKD - Stage III  P:   - monitor electrolytes - f\u renal fcn - avoid nephrotoxic drugs  GASTROINTESTINAL SUP  HEMATOLOGIC Follow CBC  INFECTIOUS Post obstructive PNA P:   abx as stated above  ENDOCRINE A:  DM P:   -ssi - ICU hypoglycemic protocol  NEUROLOGIC A: intubated P:   RASS goal: -1     I have personally obtained a history, examined the patient, evaluated laboratory and imaging results, formulated the assessment and plan and placed orders.  The Patient requires high complexity decision making for assessment and support, frequent evaluation and titration of therapies, application of advanced monitoring technologies and extensive interpretation of multiple databases. Critical Care Time devoted to patient care services described in this note is 45 minutes.   Overall, patient is critically ill, prognosis is guarded. Patient at high risk for cardiac arrest and death.   Stephanie Acre, MD [facility] Pager [phone] (please enter 7-digits) On Call Pager - [phone] (please enter 7-digits)     04/15/2015, 8:01 AM  Note: This note was prepared with Dragon dictation along with  smaller phrase technology. Any transcriptional errors that result from this process are unintentional.

## 2015-04-15 NOTE — Progress Notes (Signed)
Initial Nutrition Assessment   INTERVENTION:   Coordination of Care: Spoke with MD Mungal this am regarding nutrition poc. Will hold off initiating EN today however likely start tomorrow per MD Mungal. Will follow poc and recommend starting EN within 24-48hours of intubation as medically able.   NUTRITION DIAGNOSIS:   Inadequate oral intake related to inability to eat as evidenced by NPO status.  GOAL:   Provide needs based on ASPEN/SCCM guidelines  MONITOR:    (Energy intake, Electrolyte and renal Profile, Pulmonary Profile, Anthropometrics)  REASON FOR ASSESSMENT:   Ventilator    ASSESSMENT:   Pt admitted with h/o COPD and RLL lung mass currently intubated on the vent.  Past Medical History  Diagnosis Date  . COPD (chronic obstructive pulmonary disease) (HCC)   . Chronic respiratory failure (HCC)   . Diabetes mellitus without complication (HCC)   . Chronic diastolic heart failure (HCC)   . Hypertension   . 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 sodium chloride   Intravenous Once  . antiseptic oral rinse  7 mL Mouth Rinse QID  . aspirin EC  81 mg Oral Daily  . chlorhexidine gluconate  15 mL Mouth Rinse BID  . famotidine (PEPCID) IV  20 mg Intravenous Q24H  . heparin  5,000 Units Subcutaneous 3 times per day  . insulin aspart  0-5 Units Subcutaneous QHS  . insulin aspart  0-9 Units Subcutaneous TID WC  . ipratropium-albuterol  3 mL Nebulization Q4H  . methylPREDNISolone (SOLU-MEDROL) injection  60 mg Intravenous Q24H  . piperacillin-tazobactam (ZOSYN)  IV  3.375 g Intravenous 3 times per day  .  pravastatin  40 mg Oral Daily  . sodium chloride  3 mL Intravenous Q12H  . vancomycin  1,250 mg Intravenous Q36H  . [START ON 04/21/2015] Vitamin D (Ergocalciferol)  50,000 Units Oral Q7 days    Continuous Medications:  . sodium chloride 100 mL/hr at 04/15/15 0700  . fentaNYL infusion INTRAVENOUS 100 mcg/hr (04/15/15 1352)     Electrolyte/Renal Profile and Glucose Profile:   Recent Labs Lab 04/16/2015 2018 04/15/15 0513  NA 146* 144  K 3.9 3.4*  CL 98* 102  CO2 40* 37*  BUN 50* 50*  CREATININE 2.85* 2.92*  CALCIUM 8.9 8.4*  GLUCOSE 201* 179*   Protein Profile:   Recent Labs Lab 05/04/2015 2018  ALBUMIN 3.5   Nutritional Anemia Profile:  CBC Latest Ref Rng 04/15/2015 04/18/2015 01/25/2015  WBC 3.6 - 11.0 K/uL 12.8(H) 12.7(H) 7.3  Hemoglobin 12.0 - 16.0 g/dL 6.9(L) 7.3(L) 8.8(L)  Hematocrit 35.0 - 47.0 % 23.1(L) 26.1(L) 29.1(L)  Platelets 150 - 440 K/uL 218 283 302    Nutrition-Focused Physical Exam Findings:  Unable to complete Nutrition-Focused physical exam at this time.    Weight Change: Per [facility] weight gain over the past year   Skin:  Reviewed, no issues   Height:   Ht Readings from Last 1 Encounters:  04/30/2015 5\' 2"  (1.575 m)    Weight:   Wt Readings from Last 1 Encounters:  05/13/2015 235 lb 3.7 oz (106.7 kg)    Wt Readings from Last 10 Encounters:  05/07/2015 235 lb 3.7 oz (106.7 kg)  04/10/15 228 lb 2.8 oz (103.5 kg)  02/21/15 220 lb (99.791 kg)  01/25/15 225 lb (102.059 kg)  12/27/14 217 lb 8 oz (98.657 kg)  01/26/13 204 lb 12 oz (92.874 kg)    Ideal Body Weight:   50kg  BMI:  Body mass index is 43.01 kg/(m^2).  Estimated Nutritional Needs:   Kcal:  1173-1498kcals, (11-14kcals/kg) using current weight of 106.7kg  Protein:  2.0-2.5g/kg, 100-125g protein, using IBW of 50kg  Fluid:  1500-1773mL of fluid (30-66mL/kg) using IBW of 50kg  EDUCATION NEEDS:   Education needs no appropriate at this time   HIGH Care Level  Leda Quail, RD,  LDN Pager [phone]

## 2015-04-15 NOTE — Progress Notes (Signed)
[facility] - [place] at [facility]   PATIENT NAME: Sorah Falkenstein    MR#:  [id number]  DATE OF BIRTH:  01/11/1946  SUBJECTIVE:  CHIEF COMPLAINT:   Chief Complaint  Patient presents with  . Altered Mental Status   Sedated on ventilator.   REVIEW OF SYSTEMS:   ROS unable to obtain due to sedation  DRUG ALLERGIES:   Allergies  Allergen Reactions  . Nickel Other (See Comments)    Reaction:  Unknown   . Simvastatin Other (See Comments)    Reaction:  Unknown     VITALS:  Blood pressure 107/62, pulse 88, temperature 97.2 F (36.2 C), temperature source Axillary, resp. rate 16, height  (1.575 m), weight 106.7 kg (235 lb 3.7 oz), SpO2 91 %.  PHYSICAL EXAMINATION:  GENERAL:  69 y.o.-year-old patient lying in the bed, critically ill-appearing, on ventilator EYES: Pupils equal, round, reactive to light and accommodation. No scleral icterus. Extraocular muscles intact.  HEENT: Head atraumatic, normocephalic NECK:  Supple, no jugular venous distention. No thyroid enlargement, no tenderness.  LUNGS: Coarse breath sounds, no wheezes, rhonchi, no distress CARDIOVASCULAR: S1, S2 normal. 3/6 SEM Norubs, or gallops.  ABDOMEN: Soft, nontender, nondistended. Bowel sounds present. No organomegaly or mass.  EXTREMITIES: No pedal edema, cyanosis, or clubbing.  NEUROLOGIC: sedated PSYCHIATRIC: sedated SKIN: No obvious rash, lesion, or ulcer.    LABORATORY PANEL:   CBC  Recent Labs Lab 04/15/15 0513  WBC 12.8*  HGB 6.9*  HCT 23.1*  PLT 218   ------------------------------------------------------------------------------------------------------------------  Chemistries   Recent Labs Lab 2015-04-18 2018 04/15/15 0513  NA 146* 144  K 3.9 3.4*  CL 98* 102  CO2 40* 37*  GLUCOSE 201* 179*  BUN 50* 50*  CREATININE 2.85* 2.92*  CALCIUM 8.9 8.4*  AST 26  --   ALT 27  --   ALKPHOS 95  --   BILITOT <0.1*  --     ------------------------------------------------------------------------------------------------------------------  Cardiac Enzymes  Recent Labs Lab 04/15/15 0513  TROPONINI 0.13*   ------------------------------------------------------------------------------------------------------------------  RADIOLOGY:  Dg Chest 1 View  2015/04/18  CLINICAL DATA:  Confusion, possible dehydration with strong urine odor. Hypoxia. History of COPD, diabetes, CHF, hypertension. EXAM: CHEST 1 VIEW COMPARISON:  CT chest December 28, 2014 FINDINGS: The cardiac silhouette appears moderately enlarged, even with consideration AP technique. Pulmonary vascular congestion interstitial prominence. Patchy consolidation RIGHT mid and lower lung zone, with suspected small pleural effusions. No pneumothorax. Soft tissue planes and included osseous structures are nonsuspicious. IMPRESSION: Increasing cardiomegaly and findings of pulmonary edema. Confluent edema versus pneumonia RIGHT mid and lower lung zone and, small suspected pleural effusions. Recommend followup chest radiograph after treatment to verify improvement. Electronically Signed   By: Awilda Metro M.D.   On: 2015-04-18 21:03   Dg Chest Portable 1 View  April 18, 2015  CLINICAL DATA:  Respiratory failure. EXAM: PORTABLE CHEST 1 VIEW COMPARISON:  Apr 18, 2015 at 20:24 FINDINGS: Endotracheal tube is 3.6 cm above the carina. Nasogastric tube extends below the diaphragm and off the inferior edge of the image. Basilar consolidation persists bilaterally and there probably also or small effusions. Ground-glass opacities persist in the central lung regions. No pneumothorax is evident. IMPRESSION: Support equipment appears satisfactorily positioned. No significant interval change in the bilateral airspace opacities. Electronically Signed   By: Ellery Plunk M.D.   On: 18-Apr-2015 22:13   Dg Abd Portable 1v  04/15/2015  CLINICAL DATA:  Orogastric tube placement EXAM:  PORTABLE ABDOMEN - 1 VIEW COMPARISON:  None.  FINDINGS: Orogastric tube tip near the pylorus. Limited study for further evaluation due to underpenetrated portable technique and partial coverage of the abdomen. There is bibasilar lung opacity as seen on preceding chest x-ray. No indication of bowel obstruction. IMPRESSION: Orogastric tube tip at the pylorus. Electronically Signed   By: Marnee SpringJonathon  Watts M.D.   On: 04/15/2015 10:46    EKG:   Orders placed or performed during the hospital encounter of April 04, 2015  . ED EKG  . ED EKG  . EKG 12-Lead  . 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 Acute on chronic respiratory failure hypercapnic: - appreciate pulmonology consultation - continues on full ventilator support - has refused further evaluation of right lower lobe mass, has been discharged from oncology - now with post obstructive PNA and effusions, continue broad spectrum antibiotics  2. Elevated troponin:  - Telemetry, cardiac enzymes fairly stable, suspect this is due to strain   3. Acute kidney injury on chronic kidney disease:  - baseline Cr around 2.0, now 2.9 - continue hydration, UOP is minimal. BP low - hold nephrotoxins  4. Type 2 diabetes non-insulin-requiring: - continue SSI  5. Anemia: - anemia panel - hgb 6.9, transfuse for goal >7.9  6. Hypotension - continue hydration and transfusion  All the records are reviewed and case discussed with Care Management/Social Workerr. Management plans discussed with the patient, family and they are in agreement.  CODE STATUS: Full. I discussed with her nephew and power of attorney Melynda Rippleerry Jeffries today. We'll continue to be in touch with him. She would not want a trach or PEG in the future. She  does not want further evaluation or treatment of the lung mass.  TOTAL TIME TAKING CARE OF THIS PATIENT: 40 minutes.  Greater than 50% of time spent in care coordination and counseling. POSSIBLE D/C IN ? DAYS, DEPENDING ON CLINICAL CONDITION.   Elby ShowersWALSH, Lovell Roe M.D on 04/15/2015 at 2:56 PM  Between 7am to 6pm - Pager - [phone]  After 6pm go to [email or web address] - password [email or web address] [facility]  [facility]  Office  [phone]  CC: Primary care physician; Sherrie MustacheFayegh Jadali, MD

## 2015-04-15 NOTE — Progress Notes (Addendum)
Update : Patient self extubated. Placed on bipap but still lethargic and somnolent. Not waking up much, requiring bipap FiO2 60%, saturating in the low 90s. Responsive to sternal sub. ABG 7.28/77/67 Emergently re-intubated Meds used: Fentanyl 200 mcg Versed 4mg  Vec 100mg   Plan: -reintubated -f/u CXR and KUB -abg in 1 hr -MV, wean as tolerated - currently on fentanyl gtt and versed pushes. - continue, can adjust if needed (patient may require higher levels of sedation due to use of narcotics and opioids for RLL lung mass/suspected cancer).  -sbt and sedation vacation in the AM -labs in the AM  I have personally obtained a history, examined the patient, evaluated laboratory and imaging results, formulated the assessment and plan and placed orders. CRITICAL CARE: The patient is critically ill with multiple organ systems failure and requires high complexity decision making for assessment and support, frequent evaluation and titration of therapies, application of advanced monitoring technologies and extensive interpretation of multiple databases.   Critical Care Time devoted to patient care services described in this note is 35 mins   Stephanie AcreVishal Willow Shidler, MD [facility] Pager [phone] (please enter 7-digits) (7a-5p) On Call Pager - [phone] (please enter 7-digits) (5p-7a)

## 2015-04-15 NOTE — Progress Notes (Signed)
ANTIBIOTIC CONSULT NOTE - INITIAL  Pharmacy Consult for vancomycin and Zosyn dosing Indication: pneumonia  Allergies  Allergen Reactions  . Nickel Other (See Comments)    Reaction:  Unknown   . Simvastatin Other (See Comments)    Reaction:  Unknown     Patient Measurements: Height: 5\' 2"  (157.5 cm) Weight: 235 lb 3.7 oz (106.7 kg) IBW/kg (Calculated) : 50.1 Adjusted Body Weight: 76kg  Vital Signs: Temp: 99.4 F (37.4 C) (12/02 2359) Temp Source: Oral (12/02 2359) BP: 160/83 mmHg (12/02 2306) Pulse Rate: 109 (12/02 2306) Intake/Output from previous day: 12/02 0701 - 12/03 0700 In: -  Out: 70 [Urine:20; Emesis/NG output:50] Intake/Output from this shift: Total I/O In: -  Out: 70 [Urine:20; Emesis/NG output:50]  Labs:  Recent Labs  April 13, 2015 2018  WBC 12.7*  HGB 7.3*  PLT 283  CREATININE 2.85*   Estimated Creatinine Clearance: 22 mL/min (by C-G formula based on Cr of 2.85). No results for input(s): VANCOTROUGH, VANCOPEAK, VANCORANDOM, GENTTROUGH, GENTPEAK, GENTRANDOM, TOBRATROUGH, TOBRAPEAK, TOBRARND, AMIKACINPEAK, AMIKACINTROU, AMIKACIN in the last 72 hours.   Microbiology: No results found for this or any previous visit (from the past 720 hour(s)).  Medical History: Past Medical History  Diagnosis Date  . COPD (chronic obstructive pulmonary disease) (HCC)   . Chronic respiratory failure (HCC)   . Diabetes mellitus without complication (HCC)   . Chronic diastolic heart failure (HCC)   . Hypertension   . 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 pneumonia R side  Goal of Therapy:  Vancomycin trough level 15-20 mcg/ml  Plan:  TBW 107.1kg  IBE 54.7kg  DW76kg  Vd 53L kei 0.023 hr-1  T1/2 30 hours Vancomycin 1250 mg q 36 hours ordered. Level before 4th dose.  Zosyn 3.375 grams IV q 8 hours ordered.    Ely Spragg S 04/15/2015,12:04 AM

## 2015-04-15 NOTE — Progress Notes (Signed)
eLink Physician-Brief Progress Note Patient Name: Rhonda Garner DOB: 1946-04-13 MRN: [id number]   Date of Service  04/15/2015  HPI/Events of Note  ABG 7.33/65/56/86%. CXR and AXR reviewed  eICU Interventions  ET tube and OG tube in right place. Increase RR to 20 and FiO2 to 60%     Intervention Category Intermediate Interventions: Other:  Armstead Heiland 04/15/2015, 8:06 PM

## 2015-04-16 DIAGNOSIS — I5031 Acute diastolic (congestive) heart failure: Secondary | ICD-10-CM

## 2015-04-16 LAB — BASIC METABOLIC PANEL
Anion gap: 7 (ref 5–15)
BUN: 56 mg/dL — AB (ref 6–20)
CHLORIDE: 108 mmol/L (ref 101–111)
CO2: 30 mmol/L (ref 22–32)
CREATININE: 2.74 mg/dL — AB (ref 0.44–1.00)
Calcium: 8 mg/dL — ABNORMAL LOW (ref 8.9–10.3)
GFR calc Af Amer: 20 mL/min — ABNORMAL LOW (ref >60)
GFR calc non Af Amer: 17 mL/min — ABNORMAL LOW (ref >60)
Glucose, Bld: 160 mg/dL — ABNORMAL HIGH (ref 65–99)
POTASSIUM: 3.4 mmol/L — AB (ref 3.5–5.1)
Sodium: 145 mmol/L (ref 135–145)

## 2015-04-16 LAB — GLUCOSE, CAPILLARY
GLUCOSE-CAPILLARY: 127 mg/dL — AB (ref 65–99)
GLUCOSE-CAPILLARY: 98 mg/dL (ref 65–99)
GLUCOSE-CAPILLARY: 164 mg/dL — AB (ref 65–99)
Glucose-Capillary: 156 mg/dL — ABNORMAL HIGH (ref 65–99)
Glucose-Capillary: 161 mg/dL — ABNORMAL HIGH (ref 65–99)
Glucose-Capillary: 174 mg/dL — ABNORMAL HIGH (ref 65–99)

## 2015-04-16 LAB — CBC
HCT: 25.8 % — ABNORMAL LOW (ref 35.0–47.0)
HEMATOCRIT: 25.5 % — AB (ref 35.0–47.0)
HEMOGLOBIN: 7.6 g/dL — AB (ref 12.0–16.0)
Hemoglobin: 7.6 g/dL — ABNORMAL LOW (ref 12.0–16.0)
MCH: 21.5 pg — AB (ref 26.0–34.0)
MCH: 21.3 pg — ABNORMAL LOW (ref 26.0–34.0)
MCHC: 29.8 g/dL — AB (ref 32.0–36.0)
MCHC: 29.6 g/dL — ABNORMAL LOW (ref 32.0–36.0)
MCV: 72 fL — AB (ref 80.0–100.0)
MCV: 71.9 fL — ABNORMAL LOW (ref 80.0–100.0)
Platelets: 212 10*3/uL (ref 150–440)
Platelets: 211 10*3/uL (ref 150–440)
RBC: 3.54 MIL/uL — ABNORMAL LOW (ref 3.80–5.20)
RBC: 3.59 MIL/uL — ABNORMAL LOW (ref 3.80–5.20)
RDW: 17.7 % — ABNORMAL HIGH (ref 11.5–14.5)
RDW: 18.1 % — ABNORMAL HIGH (ref 11.5–14.5)
WBC: 16.5 10*3/uL — ABNORMAL HIGH (ref 3.6–11.0)
WBC: 15.7 10*3/uL — ABNORMAL HIGH (ref 3.6–11.0)

## 2015-04-16 LAB — BLOOD GAS, ARTERIAL
Acid-Base Excess: 5 mmol/L — ABNORMAL HIGH (ref 0.0–3.0)
Bicarbonate: 31.8 mEq/L — ABNORMAL HIGH (ref 21.0–28.0)
FIO2: 0.6
MECHANICAL RATE: 20
MECHVT: 450 mL
O2 Saturation: 94.6 %
PATIENT TEMPERATURE: 37
PCO2 ART: 55 mmHg — AB (ref 32.0–48.0)
PEEP: 5 cmH2O
PH ART: 7.37 (ref 7.350–7.450)
pO2, Arterial: 76 mmHg — ABNORMAL LOW (ref 83.0–108.0)

## 2015-04-16 LAB — URINE CULTURE: Culture: NO GROWTH

## 2015-04-16 LAB — VITAMIN B12: Vitamin B-12: 454 pg/mL (ref 180–914)

## 2015-04-16 LAB — BILIRUBIN, FRACTIONATED(TOT/DIR/INDIR)
Bilirubin, Direct: 0.3 mg/dL (ref 0.1–0.5)
Indirect Bilirubin: 0.6 mg/dL (ref 0.3–0.9)
Total Bilirubin: 0.9 mg/dL (ref 0.3–1.2)

## 2015-04-16 LAB — LACTATE DEHYDROGENASE: LDH: 196 U/L — ABNORMAL HIGH (ref 98–192)

## 2015-04-16 MED ORDER — SODIUM CHLORIDE 0.9 % IV BOLUS (SEPSIS)
500.0000 mL | INTRAVENOUS | Status: AC
  Administered 2015-04-16: 500 mL via INTRAVENOUS

## 2015-04-16 MED ORDER — NOREPINEPHRINE BITARTRATE 1 MG/ML IV SOLN
0.0000 ug/min | INTRAVENOUS | Status: DC
  Administered 2015-04-16: 2 ug/min via INTRAVENOUS
  Filled 2015-04-16: qty 4

## 2015-04-16 MED ORDER — POTASSIUM CHLORIDE 20 MEQ PO PACK
40.0000 meq | PACK | Freq: Once | ORAL | Status: AC
  Administered 2015-04-16: 40 meq via ORAL
  Filled 2015-04-16: qty 2

## 2015-04-16 MED ORDER — VITAL HIGH PROTEIN PO LIQD
1000.0000 mL | ORAL | Status: DC
  Administered 2015-04-16: 1000 mL
  Administered 2015-04-16: 20:00:00
  Administered 2015-04-17 (×3): 1000 mL

## 2015-04-16 MED ORDER — FENTANYL CITRATE (PF) 100 MCG/2ML IJ SOLN
50.0000 ug | INTRAMUSCULAR | Status: DC | PRN
  Administered 2015-04-17 (×2): 50 ug via INTRAVENOUS
  Filled 2015-04-16 (×2): qty 2

## 2015-04-16 MED ORDER — FENTANYL CITRATE (PF) 100 MCG/2ML IJ SOLN
50.0000 ug | INTRAMUSCULAR | Status: AC | PRN
  Administered 2015-04-16 – 2015-04-17 (×3): 50 ug via INTRAVENOUS
  Filled 2015-04-16 (×3): qty 2

## 2015-04-16 MED ORDER — DEXMEDETOMIDINE HCL IN NACL 400 MCG/100ML IV SOLN
0.0000 ug/kg/h | INTRAVENOUS | Status: DC
  Administered 2015-04-16: 0.8 ug/kg/h via INTRAVENOUS
  Administered 2015-04-16: 1 ug/kg/h via INTRAVENOUS
  Administered 2015-04-16: 0.4 ug/kg/h via INTRAVENOUS
  Administered 2015-04-16: 0.5 ug/kg/h via INTRAVENOUS
  Administered 2015-04-17 (×5): 1.2 ug/kg/h via INTRAVENOUS
  Filled 2015-04-16 (×9): qty 100

## 2015-04-16 MED ORDER — FREE WATER
25.0000 mL | Status: DC
  Administered 2015-04-16 – 2015-04-17 (×5): 25 mL

## 2015-04-16 NOTE — Progress Notes (Signed)
[facility] - [place] at [facility]   PATIENT NAME: Rhonda Garner    MR#:  [id number]  DATE OF BIRTH:  1946-01-10  SUBJECTIVE:  CHIEF COMPLAINT:   Chief Complaint  Patient presents with  . Altered Mental Status   Alert, anxious, still on ventilator. She did self extubate yesterday and had to be reintubated yesterday evening.   REVIEW OF SYSTEMS:   ROS unable to obtain due to sedation  DRUG ALLERGIES:   Allergies  Allergen Reactions  . Nickel Other (See Comments)    Reaction:  Unknown   . Simvastatin Other (See Comments)    Reaction:  Unknown     VITALS:  Blood pressure 107/72, pulse 73, temperature 97.1 F (36.2 C), temperature source Oral, resp. rate 20, height  (1.575 m), weight 106.7 kg (235 lb 3.7 oz), SpO2 96 %.  PHYSICAL EXAMINATION:  GENERAL:  69 y.o.-year-old patient lying in the bed, critically ill-appearing, on ventilator EYES: Pupils equal, round, reactive to light and accommodation. No scleral icterus. Extraocular muscles intact.  HEENT: Head atraumatic, normocephalic NECK:  Supple, no jugular venous distention. No thyroid enlargement, no tenderness.  LUNGS: Coarse breath sounds, no wheezes, rhonchi, no distress CARDIOVASCULAR: S1, S2 normal. 3/6 SEM Norubs, or gallops.  ABDOMEN: Soft, nontender, nondistended. Bowel sounds present. No organomegaly or mass.  EXTREMITIES: No pedal edema, cyanosis, or clubbing.  NEUROLOGIC: No focal defect, moves all 4 extremities PSYCHIATRIC: Anxious SKIN: No obvious rash, lesion, or ulcer.    LABORATORY PANEL:   CBC  Recent Labs Lab 04/16/15 0644  WBC 15.7*  HGB 7.6*  HCT 25.8*  PLT 211   ------------------------------------------------------------------------------------------------------------------  Chemistries   Recent Labs Lab May 05, 2015 2018  04/16/15 0405 04/16/15 0644  NA 146*  < > 145  --   K 3.9  < > 3.4*  --   CL 98*  < > 108  --   CO2 40*  < > 30  --   GLUCOSE  201*  < > 160*  --   BUN 50*  < > 56*  --   CREATININE 2.85*  < > 2.74*  --   CALCIUM 8.9  < > 8.0*  --   AST 26  --   --   --   ALT 27  --   --   --   ALKPHOS 95  --   --   --   BILITOT <0.1*  --   --  0.9  < > = values in this interval not displayed. ------------------------------------------------------------------------------------------------------------------  Cardiac Enzymes  Recent Labs Lab 04/15/15 1940  TROPONINI 0.09*   ------------------------------------------------------------------------------------------------------------------  RADIOLOGY:  Dg Chest 1 View  2015/05/05  CLINICAL DATA:  Confusion, possible dehydration with strong urine odor. Hypoxia. History of COPD, diabetes, CHF, hypertension. EXAM: CHEST 1 VIEW COMPARISON:  CT chest December 28, 2014 FINDINGS: The cardiac silhouette appears moderately enlarged, even with consideration AP technique. Pulmonary vascular congestion interstitial prominence. Patchy consolidation RIGHT mid and lower lung zone, with suspected small pleural effusions. No pneumothorax. Soft tissue planes and included osseous structures are nonsuspicious. IMPRESSION: Increasing cardiomegaly and findings of pulmonary edema. Confluent edema versus pneumonia RIGHT mid and lower lung zone and, small suspected pleural effusions. Recommend followup chest radiograph after treatment to verify improvement. Electronically Signed   By: Awilda Metro M.D.   On: May 05, 2015 21:03   Dg Chest Port 1 View  04/15/2015  CLINICAL DATA:  Status post intubation and orogastric tube placement. EXAM: PORTABLE CHEST 1  VIEW COMPARISON:  2015/04/26 FINDINGS: Endotracheal tube overlies the tracheal air column and terminates at the level of the clavicular heads. Enteric catheter transverses the thorax, tip collimated off the image. Cardiomediastinal silhouette is normal. Mediastinal contours appear intact. There are bilateral lower lobe predominant airspace opacities and likely  bilateral pleural effusions. No evidence of pneumothorax. Osseous structures are without acute abnormality. Soft tissues are grossly normal. IMPRESSION: Status post intubation, endotracheal tube in satisfactory position. Enteric catheter collimated off the image. Persistent bilateral lower lobe airspace consolidation, with probably bilateral pleural effusions. Right lower lobe pulmonary mass seen on prior CT is not as well visualized/obscured by airspace consolidation. Electronically Signed   By: Ted Mcalpine M.D.   On: 04/15/2015 18:19   Dg Chest Portable 1 View  26-Apr-2015  CLINICAL DATA:  Respiratory failure. EXAM: PORTABLE CHEST 1 VIEW COMPARISON:  April 26, 2015 at 20:24 FINDINGS: Endotracheal tube is 3.6 cm above the carina. Nasogastric tube extends below the diaphragm and off the inferior edge of the image. Basilar consolidation persists bilaterally and there probably also or small effusions. Ground-glass opacities persist in the central lung regions. No pneumothorax is evident. IMPRESSION: Support equipment appears satisfactorily positioned. No significant interval change in the bilateral airspace opacities. Electronically Signed   By: Ellery Plunk M.D.   On: April 26, 2015 22:13   Dg Abd Portable 1v  04/15/2015  CLINICAL DATA:  Orogastric tube placement. EXAM: PORTABLE ABDOMEN - 1 VIEW COMPARISON:  Earlier today. FINDINGS: Orogastric tube tip in the distal stomach. Nonobstructive bowel gas pattern. Breathing motion blurring. Lower lumbar spine and lower thoracic spine degenerative changes. IMPRESSION: Orogastric tube tip in the distal stomach. Electronically Signed   By: Beckie Salts M.D.   On: 04/15/2015 18:15   Dg Abd Portable 1v  04/15/2015  CLINICAL DATA:  Orogastric tube placement EXAM: PORTABLE ABDOMEN - 1 VIEW COMPARISON:  None. FINDINGS: Orogastric tube tip near the pylorus. Limited study for further evaluation due to underpenetrated portable technique and partial coverage of the  abdomen. There is bibasilar lung opacity as seen on preceding chest x-ray. No indication of bowel obstruction. IMPRESSION: Orogastric tube tip at the pylorus. Electronically Signed   By: Marnee Spring M.D.   On: 04/15/2015 10:46    EKG:   Orders placed or performed during the hospital encounter of April 26, 2015  . ED EKG  . ED EKG  . EKG 12-Lead  . 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 Acute on chronic respiratory failure hypercapnic: - appreciate pulmonology consultation and ventilator management - continues on full ventilator support - has refused further evaluation of right lower lobe mass, has been discharged from oncology - now with post obstructive PNA and effusions, continue broad spectrum antibiotics - Plan is to attempt extubation tomorrow  2. Elevated troponin:  - Telemetry, cardiac enzymes fairly stable, suspect this is due to strain   3. Acute kidney injury on chronic kidney disease: Improving with hydration - baseline Cr around 2.0, now 2.9 - continue hydration, UOP is minimal. BP low - hold nephrotoxins  4. Type 2 diabetes non-insulin-requiring: - continue SSI  5. Anemia: - anemia panel - hgb 6.9, transfuse for goal >7  6. Hypotension - continue hydration and transfusion  All the records are reviewed and case discussed with Care  Management/Social Workerr. Management plans discussed with the patient, family and they are in agreement.  CODE STATUS: Full. I discussed with her nephew and power of attorney Melynda Rippleerry Jeffries 12/3 and Dr. Tinnie GensMungo has discussed this again with him today. We'll continue to be in touch with him. She would not want a trach or PEG in the future. She does not want further evaluation or treatment of the lung  mass.  TOTAL TIME TAKING CARE OF THIS PATIENT: 40 minutes.  Greater than 50% of time spent in care coordination and counseling. POSSIBLE D/C IN ? DAYS, DEPENDING ON CLINICAL CONDITION.   Elby ShowersWALSH, Kaston Faughn M.D on 04/16/2015 at 4:24 PM  Between 7am to 6pm - Pager - [phone]  After 6pm go to [email or web address] - password [email or web address]  [facility]  [facility]  Office  [phone]  CC: Primary care physician; Sherrie MustacheFayegh Jadali, MD

## 2015-04-16 NOTE — Progress Notes (Signed)
Nutrition Follow-up   INTERVENTION:   EN: Per MD, Mungal ok to initiate EN. Will recommend Vital High Protein with a goal rate of 41m/hr to provide 1200kcals and 105g protein. Will begin at 252mhr with titration orders to increase by 1010m4hours until goal is met. Will start free water flushes of 19m63m4hous (1152mL48mly of free water including TF) and adjust accordingly as pt currently on IVF.    NUTRITION DIAGNOSIS:   Inadequate oral intake related to inability to eat as evidenced by NPO status, being addressed with TF initiation  GOAL:   Provide needs based on ASPEN/SCCM guidelines  MONITOR:    (Energy intake, Electrolyte and renal Profile, Pulmonary Profile, Anthropometrics)  REASON FOR ASSESSMENT:   Ventilator    ASSESSMENT:   Pt admitted with h/o COPD and RLL lung mass currently intubated on the vent.   Pt self-extubated yesterday; after a trial of BiPap was reintubated.  RD notes pt with low BP this am s/p bolus of fluid.   Diet Order:  Diet NPO time specified   Current Nutrition: Pt remains NPO   Gastrointestinal Profile: Last BM:  unknown, +BS, soft abdomen per Nsg   Scheduled Medications:  . sodium chloride   Intravenous Once  . antiseptic oral rinse  7 mL Mouth Rinse QID  . aspirin EC  81 mg Oral Daily  . chlorhexidine gluconate  15 mL Mouth Rinse BID  . famotidine (PEPCID) IV  20 mg Intravenous Q24H  . feeding supplement (VITAL HIGH PROTEIN)  1,000 mL Per Tube Q24H  . free water  25 mL Per Tube Q4H  . heparin  5,000 Units Subcutaneous 3 times per day  . insulin aspart  2-6 Units Subcutaneous 6 times per day  . ipratropium-albuterol  3 mL Nebulization Q4H  . methylPREDNISolone (SOLU-MEDROL) injection  60 mg Intravenous Q24H  . piperacillin-tazobactam (ZOSYN)  IV  3.375 g Intravenous 3 times per day  . pravastatin  40 mg Oral Daily  . sodium chloride  3 mL Intravenous Q12H  . vancomycin  1,250 mg Intravenous Q36H  . [START ON 04/21/2015] Vitamin  D (Ergocalciferol)  50,000 Units Oral Q7 days    Continuous Medications:  . sodium chloride 100 mL/hr at 04/16/15 0934  . dexmedetomidine 0.5 mcg/kg/hr (04/16/15 1303)  . norepinephrine (LEVOPHED) Adult infusion 4 mcg/min (04/16/15 1315)     Electrolyte/Renal Profile and Glucose Profile:   Recent Labs Lab 05/12/2015 2018 04/15/15 0513 04/16/15 0405  NA 146* 144 145  K 3.9 3.4* 3.4*  CL 98* 102 108  CO2 40* 37* 30  BUN 50* 50* 56*  CREATININE 2.85* 2.92* 2.74*  CALCIUM 8.9 8.4* 8.0*  GLUCOSE 201* 179* 160*   Protein Profile:  Recent Labs Lab 04/22/2015 2018  ALBUMIN 3.5     Weight Trend since Admission: Filed Weights   05/05/2015 2010 04/19/2015 2359  Weight: 236 lb 1 oz (107.077 kg) 235 lb 3.7 oz (106.7 kg)     Skin:  Reviewed, no issues  Ideal Body Weight:   50kg  BMI:  Body mass index is 43.01 kg/(m^2).  Estimated Nutritional Needs:   Kcal:  1173-1498kcals, (11-14kcals/kg) using current weight of 106.7kg  Protein:  2.0-2.5g/kg, 100-125g protein, using IBW of 50kg  Fluid:  1500-1750mL 37mluid (30-35mL/k70msing IBW of 50kg  EDUCATION NEEDS:   Education needs no appropriate at this time   [place] Pager [phone]

## 2015-04-16 NOTE — Progress Notes (Signed)
Dr. Eula Listeniamonds called to inform that HGB this AM is 7.6 and was 6.9 prior to receiving 2 units prbcs. No signs or symptoms of active bleeding. MD would like the HGB repeated.

## 2015-04-16 NOTE — Progress Notes (Signed)
Met with patient's sister and a friend.  Gave presence, emotional support for patient's sister and prayer. [place] 1200

## 2015-04-16 NOTE — Progress Notes (Signed)
Spoke with Dr. Dema SeverinMungal on the phone about patient's blood pressure. Patient responded well to previous bolus but did not sustain. MD order patient to start levophed even though currently no central line. RN to call if levophed dose goes above 20 mcg/min.

## 2015-04-16 NOTE — Progress Notes (Signed)
ANTIBIOTIC CONSULT NOTE - INITIAL  Pharmacy Consult for vancomycin and Zosyn dosing Indication: pneumonia  Allergies  Allergen Reactions  . Nickel Other (See Comments)    Reaction:  Unknown   . Simvastatin Other (See Comments)    Reaction:  Unknown     Patient Measurements: Height:  (157.5 cm) Weight: 235 lb 3.7 oz (106.7 kg) IBW/kg (Calculated) : 50.1 Adjusted Body Weight: 76kg  Vital Signs: Temp: 99 F (37.2 C) (12/04 0830) Temp Source: Oral (12/04 0830) BP: 91/64 mmHg (12/04 1122) Pulse Rate: 81 (12/04 1122) Intake/Output from previous day: 12/03 0701 - 12/04 0700 In: 4437.8 [I.V.:2653.8; Blood:834; IV Piggyback:950] Out: 630 [Urine:430; Emesis/NG output:200] Intake/Output from this shift:    Labs:  Recent Labs  05/09/2015 2018 04/15/15 0513 04/16/15 0405 04/16/15 0644  WBC 12.7* 12.8* 16.5* 15.7*  HGB 7.3* 6.9* 7.6* 7.6*  PLT 283 218 212 211  CREATININE 2.85* 2.92* 2.74*  --    Estimated Creatinine Clearance: 22.9 mL/min (by C-G formula based on Cr of 2.74). No results for input(s): VANCOTROUGH, VANCOPEAK, VANCORANDOM, GENTTROUGH, GENTPEAK, GENTRANDOM, TOBRATROUGH, TOBRAPEAK, TOBRARND, AMIKACINPEAK, AMIKACINTROU, AMIKACIN in the last 72 hours.   Microbiology: Recent Results (from the past 720 hour(s))  Urine culture     Status: None   Collection Time: 04/13/15  9:49 PM  Result Value Ref Range Status   Specimen Description URINE, RANDOM  Final   Special Requests NONE  Final   Culture NO GROWTH 2 DAYS  Final   Report Status 04/16/2015 FINAL  Final  Blood culture (routine x 2)     Status: None (Preliminary result)   Collection Time: 04/19/2015  8:18 PM  Result Value Ref Range Status   Specimen Description BLOOD RIGHT HAND  Final   Special Requests BOTTLES DRAWN AEROBIC AND ANAEROBIC 4CC  Final   Culture NO GROWTH 2 DAYS  Final   Report Status PENDING  Incomplete  Blood culture (routine x 2)     Status: None (Preliminary result)   Collection Time:  05/07/2015  8:18 PM  Result Value Ref Range Status   Specimen Description BLOOD RIGHT ASSIST CONTROL  Final   Special Requests BOTTLES DRAWN AEROBIC AND ANAEROBIC 4CC  Final   Culture NO GROWTH 2 DAYS  Final   Report Status PENDING  Incomplete  MRSA PCR Screening     Status: None   Collection Time: 04/25/2015 11:53 PM  Result Value Ref Range Status   MRSA by PCR NEGATIVE NEGATIVE Final    Comment:        The GeneXpert MRSA Assay (FDA approved for NASAL specimens only), is one component of a comprehensive MRSA colonization surveillance program. It is not intended to diagnose MRSA infection nor to guide or monitor treatment for MRSA infections.     Medical History: Past Medical History  Diagnosis Date  . COPD (chronic obstructive pulmonary disease) (HCC)   . Chronic respiratory failure (HCC)   . Diabetes mellitus without complication (HCC)   . Chronic diastolic heart failure (HCC)   . Hypertension   . 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   Goal of Therapy:  Vancomycin trough level 15-20 mcg/ml  Plan:  Continue vancomycin 1250 mg q 36 hours. Level before 4th dose.  Continue Zosyn 3.375 g EI q 8 hours.   Pharmacy will continue to follow renal function and culture results.  Luisa Harthristy, Brent Taillon D 04/16/2015,11:26 AM

## 2015-04-16 NOTE — Progress Notes (Signed)
Spoke with MD on the phone about patient's blood pressure at 11:03. MD ordered RN to reposition cuff and then recheck pressure. If pressure still with systolic below 90 and/or MAP below 65 RN to give 500 mL IV bolus of NS. Pressure was 64/41 (50). Bolus began.

## 2015-04-16 NOTE — Progress Notes (Signed)
Spoke with Dr. Dema SeverinMungal about patient's blood pressure and brief update on patient condition (hemoglobin, concern for bleeding, received pRBCs last night, agitated on fentanyl dose during wake up assessment, etc..). MD to round soon and ordered 500 mL bolus of NS.

## 2015-04-16 NOTE — Progress Notes (Addendum)
PULMONARY / CRITICAL CARE MEDICINE   Name: Rhonda Garner MRN: [id number] DOB: Nov 07, 1945    ADMISSION DATE:  05/11/2015 CONSULTATION DATE:  04/15/15  REFERRING MD :  Dr. Clent Ridges  CHIEF COMPLAINT:  AMS, SOB  INITIAL PRESENTATION:  69 year old female past medical history of severe COPD, on 2 L continuous, known right lower lobe lung mass, currently refusing any further workup of her lung mass, discharge by hematology/oncology presented with altered mental status shortness of breath. Had a BiPAP trial in the emergency room continues to desaturate into the 60s, intubated and transferred to the ICU.   SIGNIFICANT EVENTS: 12/2>> hypoxic/hypercapnic respiratory failure, failed BiPAP, intubated  12/3>> self extubated, placed on BiPAP trial again, still altered and somnolent, with hypercapnia, reintubated   SUBJECTIVE:  Self extubated yesterday, reintubated due to continued hypercapnia and altered mental status and ability to protect airway. This morning noted to have severe agitation even though on 200 g of fentanyl, with soft blood pressures map greater than 60 but systolic less than 90.   VITAL SIGNS: Temp:  [97.2 F (36.2 C)-99.1 F (37.3 C)] 99.1 F (37.3 C) (12/04 0500) Pulse Rate:  [66-118] 88 (12/04 0830) Resp:  [12-31] 20 (12/04 0830) BP: (77-116)/(48-64) 77/54 mmHg (12/04 0830) SpO2:  [58 %-100 %] 95 % (12/04 0830) FiO2 (%):  [45 %-60 %] 45 % (12/04 0718) HEMODYNAMICS:   VENTILATOR SETTINGS: Vent Mode:  [-] PRVC FiO2 (%):  [45 %-60 %] 45 % Set Rate:  [16 bmp-20 bmp] 20 bmp Vt Set:  [450 mL] 450 mL PEEP:  [5 cmH20] 5 cmH20 INTAKE / OUTPUT:  Intake/Output Summary (Last 24 hours) at 04/16/15 0849 Last data filed at 04/16/15 0600  Gross per 24 hour  Intake 4318.62 ml  Output    495 ml  Net 3823.62 ml    PHYSICAL EXAMINATION: General:  Intubated, sedated  Neuro:  sedated HEENT:  pupils reactive, no lesions noted on the head, no tracheal deviation,  intubated Cardiovascular:  S1, S2, no murmurs, no rubs, regular LungsIntubated, on mechanical ventilator, mildly agitated on the ventilator, coarse upper airway breath sounds, decreased breath sounds in the right, no wheezes Abdomen:  soft, positive bowel sounds, nondistended, obese abdomen  Musculoskeletal:  no lesions, no edema Skin:  dry, no rashes  LABS:  CBC  Recent Labs Lab 04/15/15 0513 04/16/15 0405 04/16/15 0644  WBC 12.8* 16.5* 15.7*  HGB 6.9* 7.6* 7.6*  HCT 23.1* 25.5* 25.8*  PLT 218 212 211   Coag's  Recent Labs Lab 05/01/2015 2018  APTT 29  INR 1.11   BMET  Recent Labs Lab 04/29/2015 2018 04/15/15 0513 04/16/15 0405  NA 146* 144 145  K 3.9 3.4* 3.4*  CL 98* 102 108  CO2 40* 37* 30  BUN 50* 50* 56*  CREATININE 2.85* 2.92* 2.74*  GLUCOSE 201* 179* 160*   Electrolytes  Recent Labs Lab 05/01/2015 2018 04/15/15 0513 04/16/15 0405  CALCIUM 8.9 8.4* 8.0*   Sepsis Markers  Recent Labs Lab 04/13/2015 2038 05/09/2015 2323  LATICACIDVEN 1.0 1.0   ABG  Recent Labs Lab 04/15/15 1610 04/15/15 1800 04/16/15 0250  PHART 7.28* 7.33* 7.37  PCO2ART 77* 65* 55*  PO2ART 67* 56* 76*   Liver Enzymes  Recent Labs Lab 04/27/2015 2018 04/16/15 0644  AST 26  --   ALT 27  --   ALKPHOS 95  --   BILITOT <0.1* 0.9  ALBUMIN 3.5  --    Cardiac Enzymes  Recent Labs Lab 04/15/15 0513 04/15/15 1357  04/15/15 1940  TROPONINI 0.13* 0.12* 0.09*   Glucose  Recent Labs Lab 04/15/15 0728 04/15/15 1114 04/15/15 1549 04/15/15 1923 04/15/15 2337 04/16/15 0700  GLUCAP 150* 130* 168* 201* 206* 127*    Imaging Dg Chest Port 1 View  04/15/2015  CLINICAL DATA:  Status post intubation and orogastric tube placement. EXAM: PORTABLE CHEST 1 VIEW COMPARISON:  April 26, 2015 FINDINGS: Endotracheal tube overlies the tracheal air column and terminates at the level of the clavicular heads. Enteric catheter transverses the thorax, tip collimated off the image.  Cardiomediastinal silhouette is normal. Mediastinal contours appear intact. There are bilateral lower lobe predominant airspace opacities and likely bilateral pleural effusions. No evidence of pneumothorax. Osseous structures are without acute abnormality. Soft tissues are grossly normal. IMPRESSION: Status post intubation, endotracheal tube in satisfactory position. Enteric catheter collimated off the image. Persistent bilateral lower lobe airspace consolidation, with probably bilateral pleural effusions. Right lower lobe pulmonary mass seen on prior CT is not as well visualized/obscured by airspace consolidation. Electronically Signed   By: Ted Mcalpine M.D.   On: 04/15/2015 18:19   Dg Abd Portable 1v  04/15/2015  CLINICAL DATA:  Orogastric tube placement. EXAM: PORTABLE ABDOMEN - 1 VIEW COMPARISON:  Earlier today. FINDINGS: Orogastric tube tip in the distal stomach. Nonobstructive bowel gas pattern. Breathing motion blurring. Lower lumbar spine and lower thoracic spine degenerative changes. IMPRESSION: Orogastric tube tip in the distal stomach. Electronically Signed   By: Beckie Salts M.D.   On: 04/15/2015 18:15   Dg Abd Portable 1v  04/15/2015  CLINICAL DATA:  Orogastric tube placement EXAM: PORTABLE ABDOMEN - 1 VIEW COMPARISON:  None. FINDINGS: Orogastric tube tip near the pylorus. Limited study for further evaluation due to underpenetrated portable technique and partial coverage of the abdomen. There is bibasilar lung opacity as seen on preceding chest x-ray. No indication of bowel obstruction. IMPRESSION: Orogastric tube tip at the pylorus. Electronically Signed   By: Marnee Spring M.D.   On: 04/15/2015 10:46    Cultures: BCx2 12/2>> UC  Sputum 12/4>>  Antibiotics: Vanc 12/2>> Zosyn 12/2>>  Lines:  ASSESSMENT / PLAN: 69 year old female with past medical history of right lower lobe mass suspicious for lung cancer, COPD, COPD, diabetes, hypertension, seen in consultation for acute  on chronic respiratory failure  PULMONARY OETT 12/2 in the ED Acute respiratory failure Post obstructive pneumonia Right lower lobe mass Mechanical ventilator dependence Small pleural effusions Chronic respiratory failure on 2 L nasal cannula at home COPD Self extubated 12/3-urgently reintubated P:  -Continue with mechanical ventilation, wean as tolerated -ABG as needed -Maintain O2 saturation greater than 80% -PAD protocol, RASS Goal -1 -Patient has adamantly refused any further workup for her right lower lobe mass, and has been discharged from hematology/oncology practice, per chart review -Continue antibiotics for post obstructive pneumonia -Small pleural effusions are most likely secondary to post obstructive pneumonia and mild CHF -Palliative care consult to discuss goals of care -Nebulizer as needed -While on the ventilator has moderate amount of agitation when trying to wean down fentanyl, has a history of narcotic/opioid use for 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  -ABG with improvement, hopefully can extubate within the next 24 hours  CARDIOVASCULAR A:  Diastolic CHF History of hypertension Hypotension P:  -continue monitor blood pressure -Currently running hypotensive, hold any antihypertensive medication -Currently requiring high doses of fentanyl, which is impacting her blood pressure, in order to control her agitation while in the ventilator will switch  to boluses of fentanyl and a Precedex drip. Monitor heart rate -Maintain map>65   RENAL A:  CKD - Stage III  P:  - monitor electrolytes - f\u renal fcn - avoid nephrotoxic drugs  GASTROINTESTINAL SUP  HEMATOLOGIC Follow CBC  INFECTIOUS Post obstructive PNA P:  abx as stated above Follow-up cultures  ENDOCRINE A: DM P:  -ssi - ICU hypoglycemic protocol  NEUROLOGIC A: intubated P:  RASS goal: -1   I have personally obtained a  history, examined the patient, evaluated laboratory and imaging results, formulated the assessment and plan and placed orders.  The Patient requires high complexity decision making for assessment and support, frequent evaluation and titration of therapies, application of advanced monitoring technologies and extensive interpretation of multiple databases. Critical Care Time devoted to patient care services described in this note is 40 minutes.   Overall, patient is critically ill, prognosis is guarded. Patient at high risk for cardiac arrest and death.    Stephanie AcreVishal Zaylei Mullane, MD  Pulmonary and Critical Care Pager [phone] (please enter 7-digits) On Call Pager - [phone] (please enter 7-digits)  Note: This note was prepared with Dragon dictation along with smaller phrase technology. Any transcriptional errors that result from this process are unintentional.

## 2015-04-17 ENCOUNTER — Inpatient Hospital Stay: Payer: Medicare Other

## 2015-04-17 LAB — BASIC METABOLIC PANEL
ANION GAP: 10 (ref 5–15)
BUN: 70 mg/dL — AB (ref 6–20)
CALCIUM: 8 mg/dL — AB (ref 8.9–10.3)
CO2: 23 mmol/L (ref 22–32)
Chloride: 112 mmol/L — ABNORMAL HIGH (ref 101–111)
Creatinine, Ser: 3.22 mg/dL — ABNORMAL HIGH (ref 0.44–1.00)
GFR calc Af Amer: 16 mL/min — ABNORMAL LOW (ref >60)
GFR, EST NON AFRICAN AMERICAN: 14 mL/min — AB (ref >60)
GLUCOSE: 157 mg/dL — AB (ref 65–99)
Potassium: 4.3 mmol/L (ref 3.5–5.1)
Sodium: 145 mmol/L (ref 135–145)

## 2015-04-17 LAB — CBC
HCT: 29.5 % — ABNORMAL LOW (ref 35.0–47.0)
Hemoglobin: 8.8 g/dL — ABNORMAL LOW (ref 12.0–16.0)
MCH: 21.6 pg — ABNORMAL LOW (ref 26.0–34.0)
MCHC: 29.8 g/dL — ABNORMAL LOW (ref 32.0–36.0)
MCV: 72.5 fL — AB (ref 80.0–100.0)
PLATELETS: 214 10*3/uL (ref 150–440)
RBC: 4.07 MIL/uL (ref 3.80–5.20)
RDW: 18.2 % — AB (ref 11.5–14.5)
WBC: 20.7 10*3/uL — AB (ref 3.6–11.0)

## 2015-04-17 LAB — GLUCOSE, CAPILLARY
Glucose-Capillary: 158 mg/dL — ABNORMAL HIGH (ref 65–99)
Glucose-Capillary: 175 mg/dL — ABNORMAL HIGH (ref 65–99)
Glucose-Capillary: 177 mg/dL — ABNORMAL HIGH (ref 65–99)
Glucose-Capillary: 234 mg/dL — ABNORMAL HIGH (ref 65–99)

## 2015-04-17 MED ORDER — GLYCOPYRROLATE 1 MG PO TABS
1.0000 mg | ORAL_TABLET | ORAL | Status: DC | PRN
  Filled 2015-04-17: qty 1

## 2015-04-17 MED ORDER — PIPERACILLIN-TAZOBACTAM 3.375 G IVPB
3.3750 g | Freq: Two times a day (BID) | INTRAVENOUS | Status: DC
  Administered 2015-04-17: 3.375 g via INTRAVENOUS
  Filled 2015-04-17 (×2): qty 50

## 2015-04-17 MED ORDER — HALOPERIDOL LACTATE 5 MG/ML IJ SOLN: 0.5000 mg | INTRAMUSCULAR | Status: DC | PRN

## 2015-04-17 MED ORDER — ACETAMINOPHEN 325 MG PO TABS: 650.0000 mg | ORAL_TABLET | Freq: Four times a day (QID) | ORAL | Status: DC | PRN

## 2015-04-17 MED ORDER — FENTANYL 2500MCG IN NS 250ML (10MCG/ML) PREMIX INFUSION
INTRAVENOUS | Status: AC
  Administered 2015-04-17: 25 ug/h via INTRAVENOUS
  Filled 2015-04-17: qty 250

## 2015-04-17 MED ORDER — BIOTENE DRY MOUTH MT LIQD: 15.0000 mL | OROMUCOSAL | Status: DC | PRN

## 2015-04-17 MED ORDER — GLYCOPYRROLATE 0.2 MG/ML IJ SOLN: 0.2000 mg | INTRAMUSCULAR | Status: DC | PRN

## 2015-04-17 MED ORDER — FENTANYL 2500MCG IN NS 250ML (10MCG/ML) PREMIX INFUSION
0.0000 ug/h | INTRAVENOUS | Status: DC
  Administered 2015-04-17: 25 ug/h via INTRAVENOUS

## 2015-04-17 MED ORDER — LORAZEPAM 2 MG/ML IJ SOLN
1.0000 mg | INTRAMUSCULAR | Status: DC | PRN
  Administered 2015-04-17: 1 mg via INTRAVENOUS
  Filled 2015-04-17: qty 1

## 2015-04-17 MED ORDER — MORPHINE SULFATE (PF) 2 MG/ML IV SOLN
2.0000 mg | INTRAVENOUS | Status: DC | PRN
  Administered 2015-04-17: 2 mg via INTRAVENOUS
  Administered 2015-04-17: 4 mg via INTRAVENOUS
  Administered 2015-04-17: 2 mg via INTRAVENOUS
  Filled 2015-04-17: qty 2
  Filled 2015-04-17 (×2): qty 1

## 2015-04-17 MED ORDER — FREE WATER
100.0000 mL | Status: DC
Start: 2015-04-17 — End: 2015-04-17
  Administered 2015-04-17: 100 mL

## 2015-04-17 MED ORDER — ACETAMINOPHEN 650 MG RE SUPP
650.0000 mg | Freq: Four times a day (QID) | RECTAL | Status: DC | PRN
Start: 2015-04-17 — End: 2015-04-17

## 2015-04-17 MED ORDER — ONDANSETRON HCL 4 MG/2ML IJ SOLN: 4.0000 mg | Freq: Four times a day (QID) | INTRAMUSCULAR | Status: DC | PRN

## 2015-04-17 MED ORDER — HALOPERIDOL 0.5 MG PO TABS: 0.5000 mg | ORAL_TABLET | ORAL | Status: DC | PRN

## 2015-04-17 MED ORDER — POLYVINYL ALCOHOL 1.4 % OP SOLN
1.0000 [drp] | Freq: Four times a day (QID) | OPHTHALMIC | Status: DC | PRN
  Filled 2015-04-17: qty 15

## 2015-04-17 MED ORDER — HALOPERIDOL LACTATE 2 MG/ML PO CONC
0.5000 mg | ORAL | Status: DC | PRN
  Filled 2015-04-17: qty 0.3

## 2015-04-17 MED ORDER — VANCOMYCIN HCL 10 G IV SOLR: 1250.0000 mg | INTRAVENOUS | Status: DC

## 2015-04-17 MED ORDER — ONDANSETRON 4 MG PO TBDP
4.0000 mg | ORAL_TABLET | Freq: Four times a day (QID) | ORAL | Status: DC | PRN
  Filled 2015-04-17: qty 1

## 2015-04-18 LAB — TYPE AND SCREEN
ABO/RH(D): O POS
Antibody Screen: NEGATIVE
Donor AG Type: NEGATIVE
Donor AG Type: NEGATIVE
UNIT DIVISION: 0
UNIT DIVISION: 0

## 2015-04-19 LAB — CULTURE, BLOOD (ROUTINE X 2)
CULTURE: NO GROWTH
Culture: NO GROWTH

## 2015-04-19 LAB — CULTURE, RESPIRATORY
CULTURE: NORMAL
SPECIAL REQUESTS: NORMAL

## 2015-05-14 NOTE — Progress Notes (Signed)
Palliative Care Update   I am personally out of the hospital today, but have kept in contact with Guilford ShiEmily Howell, Social Worker for [facility], who has been able to assist in my absence today. She has just filled me in by phone as to what has and is going on with pt.    I have reviewed the notes and have spoken wth Irving Burtonmily, who attended rounds this am and who was able to update me about the plan of care as is being facilitated by Critical Care physicians.   It does not appear that Irving Burtonmily is needed for supportive conversations with family/ pt today, but she is available to help with discussions all day today, if that changes.    I will return tomorrow and will discuss the situation and plans with attending and critical care physicians.    Suan HalterMargaret F Ahman Dugdale, MD

## 2015-05-14 NOTE — Progress Notes (Signed)
Notified of family wish to withdraw care and extubation. Comfort measures initiated. Orders reviewed and adjusted. I agree that this is in line with the patients wishes as they have been expressed to me by her POA.

## 2015-05-14 NOTE — Progress Notes (Signed)
Patient's nephew and POA arrived to unit and stated to RN first thing that he "wanted to withdraw and take the tube out". RN called [facility] and spoke to Glade NurseE Link MD about patient's family's wishes. Dr. Nicholos Johnsamachandran called RN back and spoke to patient's nephew over the phone.

## 2015-05-14 NOTE — Progress Notes (Signed)
   04/22/2015 1922  Clinical Encounter Type  Visited With Family  Visit Type Death  Referral From Nurse  Consult/Referral To Chaplain  Spiritual Encounters  Spiritual Needs Prayer;Emotional;Grief support  Stress Factors  Family Stress Factors Loss  Provided emotional/grief support and prayer for family. Konrad PentaChap Rick Westin Knotts [phone]

## 2015-05-14 NOTE — Progress Notes (Signed)
Discussed with pt's personal care manager, and pt's nephew who is PoA again. He does not want to prolong the pt's suffering, and in light of the fact that the patient has refused numerous previous interventions for her terminal disease in the past, would like to make the pt comfort measures.   Orders placed for comfort measures, discontinued other measures, and placed order for extubation.

## 2015-05-14 NOTE — Progress Notes (Signed)
Patient extubated to comfort care per orders. Patient resting comfortably with family at bedside. Restraints discontinued since patient calm and endotracheal and orogastric tubes removed so no longer danger of tube removal.

## 2015-05-14 NOTE — Progress Notes (Signed)
Spoke with Dr. Nicholos Johnsamachandran (with Dr. Belia HemanKasa present) in person about patient's outpatient care manager Kathleen's discussion with patient's nephew and POA Marina Goodell(Perry) who may be coming tonight and chose to make patient comfort care and discontinue current treatment. Son [place] in [place] per Nicholos JohnsKathleen so it may be a while before he arrives. Both MDs aware and Dr. Nicholos Johnsamachandran stated he would report off to [facility] about the situation and potential withdrawal.

## 2015-05-14 NOTE — Progress Notes (Signed)
Patient had episode of extreme agitation and combativeness. While in wrist restraints managed to disconnect et tube from ventilator and pull out OG tube. When RN and NT came to reconnect and calm patient, patient managed to grab RN's shirt and twist neck of shirt while RN reconnected pt to ventilator. Two more RNs arrived to help entangle first RN. Patient given prn fentanyl dose to help calm her. Wake up assessment deferred at this time. MD came by in person and ordered RN to pursue weaning trial without lowered precedex and to not replace the OG tube at this time due to potential extubation.

## 2015-05-14 NOTE — Clinical Documentation Improvement (Signed)
Hospitalist  Can the diagnosis of altered mental status be further specified?   Confusion/delirium (including drug induced)  Drowsiness/somnolence  Stupor/Semi-coma  Encephalopathy - Alcoholic, Anoxic/Hypoxia, Drug Induced/Toxic (specify drug), Hepatic, Hypertensive, Hypoglycemic, Metabolic/Septic, Traumatic/post concussive, Wernicke, Other  Other  Clinically Undetermined  Document any associated diagnoses/conditions.   Supporting Information: Patient with AMS, obtundation per 12/03 progress notes.   Please exercise your independent, professional judgment when responding. A specific answer is not anticipated or expected.   Thank Sabino DonovanYou,  Marbin Olshefski [facility] [place] [phone]

## 2015-05-14 NOTE — Clinical Documentation Improvement (Signed)
Hospitalist  Can the diagnosis of systemic infection be further specified?   Sepsis - specify causative organism if known  Determine if there is Severe Sepsis (Sepsis with organ dysfunction - specify), Septic Shock if present  Identify etiology of Sepsis - Device, Implant, Graft, Infusion, Abortion  Specify organ dysfunction - Respiratory Failure, Encephalopathy, Acute Kidney Failure, Pneumonia, UTI, Other (specify), Unable to Clinically Determine}  Other  Clinically Undetermined  Document any associated diagnoses/conditions.   Supporting Information: Sepsis due to unspecified organism per 12/03 progress notes.  Lactic acid: 12/02:  1.0;  1.0.   Please exercise your independent, professional judgment when responding. A specific answer is not anticipated or expected.   Thank Sabino DonovanYou,  Felder Lebeda [facility] [place] [phone]

## 2015-05-14 NOTE — Progress Notes (Signed)
PULMONARY / CRITICAL CARE MEDICINE   Name: Rhonda Garner MRN: [id number] DOB: Nov 10, 1945    ADMISSION DATE:  04/16/2015 CONSULTATION DATE:  04/15/15  REFERRING MD :  Dr. Clent Ridges  CHIEF COMPLAINT:  AMS, SOB  INITIAL PRESENTATION:  70 year old female past medical history of severe COPD, on 2 L continuous, known right lower lobe lung mass, currently refusing any further workup of her lung mass, discharge by hematology/oncology presented with altered mental status shortness of breath. Had a BiPAP trial in the emergency room continues to desaturate into the 60s, intubated and transferred to the ICU.   SIGNIFICANT EVENTS: 12/2>> hypoxic/hypercapnic respiratory failure, failed BiPAP, intubated  12/3>> self extubated, placed on BiPAP trial again, still altered and somnolent, with hypercapnia, reintubated   SUBJECTIVE:  The patient remains on the ventilator, she is somewhat uncomfortable but appears to be overall adequately sedated. She was tried on a weaning trial today, but could not tolerate this and was placed Back on the ventilator.  VITAL SIGNS: Temp:  [98 F (36.7 C)-100.1 F (37.8 C)] 98.7 F (37.1 C) (12/05 1200) Pulse Rate:  [71-108] 75 (12/05 1400) Resp:  [19-24] 20 (12/05 1400) BP: (98-140)/(67-88) 134/85 mmHg (12/05 1400) SpO2:  [94 %-98 %] 94 % (12/05 1400) FiO2 (%):  [40 %-50 %] 40 % (12/05 1300) Weight:  [115.5 kg (254 lb 10.1 oz)] 115.5 kg (254 lb 10.1 oz) (12/05 0500) HEMODYNAMICS:   VENTILATOR SETTINGS: Vent Mode:  [-] PRVC FiO2 (%):  [40 %-50 %] 40 % Set Rate:  [20 bmp] 20 bmp Vt Set:  [450 mL] 450 mL PEEP:  [5 cmH20] 5 cmH20 INTAKE / OUTPUT:  Intake/Output Summary (Last 24 hours) at 05/01/2015 1435 Last data filed at 05/01/15 1351  Gross per 24 hour  Intake 4274.44 ml  Output    298 ml  Net 3976.44 ml    PHYSICAL EXAMINATION: General:  Intubated, sedated  Neuro:  sedated HEENT:  pupils reactive, no lesions noted on the head, no tracheal deviation,  intubated Cardiovascular:  S1, S2, no murmurs, no rubs, regular LungsIntubated, on mechanical ventilator, mildly agitated on the ventilator, coarse upper airway breath sounds, decreased breath sounds in the right, no wheezes Abdomen:  soft, positive bowel sounds, nondistended, obese abdomen  Musculoskeletal:  no lesions, no edema Skin:  dry, no rashes  LABS:  CBC  Recent Labs Lab 04/16/15 0405 04/16/15 0644 May 01, 2015 0438  WBC 16.5* 15.7* 20.7*  HGB 7.6* 7.6* 8.8*  HCT 25.5* 25.8* 29.5*  PLT 212 211 214   Coag's  Recent Labs Lab 04/22/2015 2018  APTT 29  INR 1.11   BMET  Recent Labs Lab 04/15/15 0513 04/16/15 0405 05/01/15 0438  NA 144 145 145  K 3.4* 3.4* 4.3  CL 102 108 112*  CO2 37* 30 23  BUN 50* 56* 70*  CREATININE 2.92* 2.74* 3.22*  GLUCOSE 179* 160* 157*   Electrolytes  Recent Labs Lab 04/15/15 0513 04/16/15 0405 05-01-15 0438  CALCIUM 8.4* 8.0* 8.0*   Sepsis Markers  Recent Labs Lab 05/10/2015 2038 04/25/2015 2323  LATICACIDVEN 1.0 1.0   ABG  Recent Labs Lab 04/15/15 1610 04/15/15 1800 04/16/15 0250  PHART 7.28* 7.33* 7.37  PCO2ART 77* 65* 55*  PO2ART 67* 56* 76*   Liver Enzymes  Recent Labs Lab 04/27/2015 2018 04/16/15 0644  AST 26  --   ALT 27  --   ALKPHOS 95  --   BILITOT <0.1* 0.9  ALBUMIN 3.5  --    Cardiac Enzymes  Recent Labs Lab 04/15/15 0513 04/15/15 1357 04/15/15 1940  TROPONINI 0.13* 0.12* 0.09*   Glucose  Recent Labs Lab 04/16/15 1556 04/16/15 1949 04/16/15 2333 04/20/2015 0351 04/24/2015 0713 05/13/2015 1119  GLUCAP 164* 156* 174* 158* 175* 177*    Imaging Dg Abd Portable 1v  04/18/2015  CLINICAL DATA:  Assess orogastric tube placement. EXAM: PORTABLE ABDOMEN - 1 VIEW COMPARISON:  Portable AP abdominal film of April 15, 2015 FINDINGS: The esophagogastric tube tip lies in the region of the mid gastric body. The bowel gas pattern where visualized is unremarkable. IMPRESSION: The esophagogastric tube tip  overlies the mid gastric body. Electronically Signed   By: David  Swaziland M.D.   On: 04/20/2015 12:12    Cultures: BCx2 12/2>> UC  Sputum 12/4>>  Antibiotics: Vanc 12/2>> Zosyn 12/2>>  Lines:  ASSESSMENT / PLAN:   PULMONARY OETT 12/2 in the ED Acute respiratory failure Post obstructive pneumonia Right lower lobe mass Mechanical ventilator dependence Small pleural effusions Chronic respiratory failure on 2 L nasal cannula at home COPD Self extubated 12/3-urgently reintubated P:  -Continue with mechanical ventilation, wean as tolerated -ABG as needed -Maintain O2 saturation greater than 80% -PAD protocol, RASS Goal -1 -Patient has adamantly refused any further workup for her right lower lobe mass, and has been discharged from hematology/oncology practice, per chart review -Continue antibiotics for post obstructive pneumonia -Small pleural effusions are most likely secondary to post obstructive pneumonia and mild CHF -Nebulizer as needed -While on the ventilator has moderate amount of agitation when trying to wean down fentanyl, has a history of narcotic/opioid use for 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  -ABG with improvement, hopefully can extubate within the next 24 hours  I discussed goals of care with the patient's nephew who is the patient's power of attorney today. They appeared in agreement that the patient does not want any type of lifesaving treatment, and she did not want to be placed on the ventilator. We will thus change her CODE STATUS to DO NOT RESUSCITATE at this time. The patient's nephew does want to see if she might recover to some degree and therefore we will continue to monitor the patient over the next 48 hours.  He is in agreement that he will not keep the patient intubated for more than another 48 hours, we will perform a perform a terminal extubation, likely on Wednesday and change the goals of the patient's care  to comfort measures only, At that time.  CARDIOVASCULAR A:  Diastolic CHF History of hypertension Hypotension P:  -continue monitor blood pressure -Currently running hypotensive, hold any antihypertensive medication -Currently requiring high doses of fentanyl, which is impacting her blood pressure, in order to control her agitation while in the ventilator will switch to boluses of fentanyl and a Precedex drip. Monitor heart rate -Maintain map>65   RENAL A:  CKD - Stage III  P:  - monitor electrolytes - f\u renal fcn - avoid nephrotoxic drugs  GASTROINTESTINAL SUP  HEMATOLOGIC Follow CBC  INFECTIOUS Post obstructive PNA P:  abx as stated above Follow-up cultures  ENDOCRINE A: DM P:  -ssi - ICU hypoglycemic protocol  NEUROLOGIC A: intubated P:  RASS goal: -1   I have personally obtained a history, examined the patient, evaluated laboratory and imaging results, formulated the assessment and plan and placed orders.  The Patient requires high complexity decision making for assessment and support, frequent evaluation and titration of therapies, application of advanced monitoring  technologies and extensive interpretation of multiple databases. Critical Care Time devoted to patient care services described in this note is 40 minutes.   Overall, patient is critically ill, prognosis is guarded. Patient at high risk for cardiac arrest and death.    Wells Guileseep Fayez Sturgell M.D.  Note: This note was prepared with Dragon dictation along with smaller phrase technology. Any transcriptional errors that result from this process are unintentional.  Critical Care Attestation.  I have personally obtained a history, examined the patient, evaluated laboratory and imaging results, formulated the assessment and plan and placed orders. The Patient requires high complexity decision making for assessment and support, frequent evaluation and titration of therapies, application  of advanced monitoring technologies and extensive interpretation of multiple databases. The patient has critical illness that could lead imminently to failure of 1 or more organ systems and requires the highest level of physician preparedness to intervene.  Critical Care Time devoted to patient care services described in this note is 35 minutes and is exclusive of time spent in procedures.

## 2015-05-14 NOTE — Progress Notes (Signed)
2000: during assessment and repositioning pt. Became combative striking this nurse and kicking towards second nurse as well as reaching towards ETT.  Pt. Was given PRN medication and has a second continuous gtt running (see EMR for details).  Pt. Still alert/agitated and combative after PRN dose and titrating up on gtt. Called and spoke with [facility]-link Dr. Effie BerkshireExplained pt. Self-extubated yesterday and attempted to self-extubate x 3 today with soft mitts as well as being combative.  2042: Pt. Placed in soft wrist restraints, nephew: POA was called- left voicemail.  2230: Pt.'s nephew called back verified password and was informed of restraint application.  Nephew stated he understood.

## 2015-05-14 NOTE — Accreditation Note (Signed)
o Restraints not reported to CMS Pursuant to regulation 482.13 (G) (3) use of soft wrist restraints was logged on 12.06.2016 at 0713 by Rosine DoorAngus Tresha Muzio, RN, Patient Safety and Programme researcher, broadcasting/film/videoAccreditation Coordinator.

## 2015-05-14 NOTE — Progress Notes (Addendum)
[facility] - [address] at [facility]   PATIENT NAME: Rhonda Garner    MR#:  [id number]  DATE OF BIRTH:  1945-10-25  SUBJECTIVE:  CHIEF COMPLAINT:   Chief Complaint  Patient presents with  . Altered Mental Status   Sedated this morning. Has had several additional attempts to self extubate.   REVIEW OF SYSTEMS:   ROS unable to obtain due to sedation  DRUG ALLERGIES:   Allergies  Allergen Reactions  . Nickel Other (See Comments)    Reaction:  Unknown   . Simvastatin Other (See Comments)    Reaction:  Unknown     VITALS:  Blood pressure 127/82, pulse 71, temperature 96.3 F (35.7 C), temperature source Axillary, resp. rate 20, height 5\' 2"  (1.575 m), weight 115.5 kg (254 lb 10.1 oz), SpO2 95 %.  PHYSICAL EXAMINATION:  GENERAL:  70 y.o.-year-old patient lying in the bed, critically ill-appearing, on ventilator EYES: Pupils equal, round, reactive to light and accommodation. No scleral icterus. Extraocular muscles intact.  HEENT: Head atraumatic, normocephalic NECK:  Supple, no jugular venous distention. No thyroid enlargement, no tenderness.  LUNGS: Coarse breath sounds, no wheezes, rhonchi, no distress CARDIOVASCULAR: S1, S2 normal. 3/6 SEM Norubs, or gallops.  ABDOMEN: Soft, nontender, nondistended. Bowel sounds present. No organomegaly or mass.  EXTREMITIES: No pedal edema, cyanosis, or clubbing.  NEUROLOGIC: Sedated PSYCHIATRIC: Sedated  SKIN: No obvious rash, lesion, or ulcer.    LABORATORY PANEL:   CBC  Recent Labs Lab 04/28/2015 0438  WBC 20.7*  HGB 8.8*  HCT 29.5*  PLT 214   ------------------------------------------------------------------------------------------------------------------  Chemistries   Recent Labs Lab September 09, 2014 2018  04/16/15 0644 04/30/2015 0438  NA 146*  < >  --  145  K 3.9  < >  --  4.3  CL 98*  < >  --  112*  CO2 40*  < >  --  23  GLUCOSE 201*  < >  --  157*  BUN 50*  < >  --  70*  CREATININE 2.85*  < >   --  3.22*  CALCIUM 8.9  < >  --  8.0*  AST 26  --   --   --   ALT 27  --   --   --   ALKPHOS 95  --   --   --   BILITOT <0.1*  --  0.9  --   < > = values in this interval not displayed. ------------------------------------------------------------------------------------------------------------------  Cardiac Enzymes  Recent Labs Lab 04/15/15 1940  TROPONINI 0.09*   ------------------------------------------------------------------------------------------------------------------  RADIOLOGY:  Dg Chest Port 1 View  04/15/2015  CLINICAL DATA:  Status post intubation and orogastric tube placement. EXAM: PORTABLE CHEST 1 VIEW COMPARISON:  07-Feb-2015 FINDINGS: Endotracheal tube overlies the tracheal air column and terminates at the level of the clavicular heads. Enteric catheter transverses the thorax, tip collimated off the image. Cardiomediastinal silhouette is normal. Mediastinal contours appear intact. There are bilateral lower lobe predominant airspace opacities and likely bilateral pleural effusions. No evidence of pneumothorax. Osseous structures are without acute abnormality. Soft tissues are grossly normal. IMPRESSION: Status post intubation, endotracheal tube in satisfactory position. Enteric catheter collimated off the image. Persistent bilateral lower lobe airspace consolidation, with probably bilateral pleural effusions. Right lower lobe pulmonary mass seen on prior CT is not as well visualized/obscured by airspace consolidation. Electronically Signed   By: Ted Mcalpineobrinka  Dimitrova M.D.   On: 04/15/2015 18:19   Dg Abd Portable 1v  04/20/2015  CLINICAL DATA:  Assess orogastric tube placement. EXAM: PORTABLE ABDOMEN - 1 VIEW COMPARISON:  Portable AP abdominal film of April 15, 2015 FINDINGS: The esophagogastric tube tip lies in the region of the mid gastric body. The bowel gas pattern where visualized is unremarkable. IMPRESSION: The esophagogastric tube tip overlies the mid gastric body.  Electronically Signed   By: David  Swaziland M.D.   On: 05/10/2015 12:12   Dg Abd Portable 1v  04/15/2015  CLINICAL DATA:  Orogastric tube placement. EXAM: PORTABLE ABDOMEN - 1 VIEW COMPARISON:  Earlier today. FINDINGS: Orogastric tube tip in the distal stomach. Nonobstructive bowel gas pattern. Breathing motion blurring. Lower lumbar spine and lower thoracic spine degenerative changes. IMPRESSION: Orogastric tube tip in the distal stomach. Electronically Signed   By: Beckie Salts M.D.   On: 04/15/2015 18:15    EKG:   Orders placed or performed during the hospital encounter of 04-18-15  . ED EKG  . ED EKG  . EKG 12-Lead  . 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 Acute on chronic respiratory failure hypercapnic: - appreciate pulmonology consultation and ventilator management - continues on full ventilator support, repeatedly tries to self extubate - has refused further evaluation of right lower lobe mass, has been discharged from oncology - now with post obstructive PNA and effusions, continue broad spectrum antibiotics - Failed SBT today  2. Elevated troponin:  - Telemetry, cardiac enzymes fairly stable, suspect this is due to strain   3. Acute kidney injury on chronic kidney disease: Worsening - baseline Cr around 2.0, now 3.2 - continue hydration, UOP is minimal. BP low - hold nephrotoxins  4. Type 2 diabetes non-insulin-requiring: - continue SSI  5. Anemia: - anemia panel - hgb 6.9, transfused 1 unit PRBCs on 12/4 for goal >7  6. Hypotension - continue hydration and levo fed as needed  All the records are reviewed and case discussed with Care Management/Social Workerr. Management plans discussed with the patient, family and they are in  agreement.  CODE STATUS: Full. I discussed with her nephew and power of attorney Melynda Ripple 12/3 and Dr. Tinnie Gens has discussed this again with him 12/4 and Dr. Ardyth Man on 12/5. We'll continue to be in touch with him. She would not want a trach or PEG in the future. She does not want further evaluation or treatment of the lung mass.  TOTAL TIME TAKING CARE OF THIS PATIENT: 40 minutes.  Greater than 50% of time spent in care coordination and counseling. POSSIBLE D/C IN ? DAYS, DEPENDING ON CLINICAL CONDITION.   Elby Showers M.D on 04/30/2015 at 5:02 PM  Between 7am to 6pm - Pager - [phone]  After 6pm go to [email or web address] - password [email or web address] [facility]  [facility]  Office  [phone]  CC: Primary care physician; Sherrie Mustache, MD

## 2015-05-14 NOTE — Progress Notes (Signed)
   09/28/2014 1655  Clinical Encounter Type  Visited With Patient and family together  Visit Type Follow-up;Spiritual support  Referral From Nurse  Consult/Referral To Chaplain  Spiritual Encounters  Spiritual Needs Emotional  Stress Factors  Patient Stress Factors Health changes  Family Stress Factors Health changes  Visited with PT and family. Provided emotional support. Konrad PentaChap Rick Jaz Mallick [phone]

## 2015-05-14 NOTE — Clinical Documentation Improvement (Signed)
Hospitalist  Can the diagnosis of CHF be further specified?    Acuity - Acute, Chronic, Acute on Chronic   Type - Systolic, Diastolic, Systolic and Diastolic  Other  Clinically Undetermined   Document any associated diagnoses/conditions   Supporting Information: Chronic diastolic failure noted per 12/03 progress notes. Acute CHF, unspecified type noted per 12/03 progress notes.  12/02:  BNP: 1014.0.   Please exercise your independent, professional judgment when responding. A specific answer is not anticipated or expected.   Thank Sabino DonovanYou,  Raziyah Vanvleck [facility] [employer] [place] [phone]

## 2015-05-14 NOTE — Progress Notes (Signed)
ANTIBIOTIC CONSULT NOTE -FOLLOW UP   Pharmacy Consult for vancomycin and Zosyn dosing Indication: pneumonia  Allergies  Allergen Reactions  . Nickel Other (See Comments)    Reaction:  Unknown   . Simvastatin Other (See Comments)    Reaction:  Unknown     Patient Measurements: Height: 5\' 2"  (157.5 cm) Weight: 254 lb 10.1 oz (115.5 kg) IBW/kg (Calculated) : 50.1 Adjusted Body Weight: 76kg  Vital Signs: Temp: 99.1 F (37.3 C) (12/05 0723) Temp Source: Oral (12/05 0723) BP: 130/82 mmHg (12/05 0700) Pulse Rate: 84 (12/05 0700) Intake/Output from previous day: 12/04 0701 - 12/05 0700 In: 4651.8 [I.V.:3369.3; NG/GT:832.5; IV Piggyback:450] Out: 638 [Urine:338; Emesis/NG output:300] Intake/Output from this shift:    Labs:  Recent Labs  04/15/15 0513 04/16/15 0405 04/16/15 0644 Dec 12, 2014 0438  WBC 12.8* 16.5* 15.7* 20.7*  HGB 6.9* 7.6* 7.6* 8.8*  PLT 218 212 211 214  CREATININE 2.92* 2.74*  --  3.22*   Estimated Creatinine Clearance: 20.4 mL/min (by C-G formula based on Cr of 3.22). No results for input(s): VANCOTROUGH, VANCOPEAK, VANCORANDOM, GENTTROUGH, GENTPEAK, GENTRANDOM, TOBRATROUGH, TOBRAPEAK, TOBRARND, AMIKACINPEAK, AMIKACINTROU, AMIKACIN in the last 72 hours.   Microbiology: Recent Results (from the past 720 hour(s))  Urine culture     Status: None   Collection Time: 04/13/15  9:49 PM  Result Value Ref Range Status   Specimen Description URINE, RANDOM  Final   Special Requests NONE  Final   Culture NO GROWTH 2 DAYS  Final   Report Status 04/16/2015 FINAL  Final  Blood culture (routine x 2)     Status: None (Preliminary result)   Collection Time: 05/13/2015  8:18 PM  Result Value Ref Range Status   Specimen Description BLOOD RIGHT HAND  Final   Special Requests BOTTLES DRAWN AEROBIC AND ANAEROBIC 4CC  Final   Culture NO GROWTH 2 DAYS  Final   Report Status PENDING  Incomplete  Blood culture (routine x 2)     Status: None (Preliminary result)   Collection  Time: 05/07/2015  8:18 PM  Result Value Ref Range Status   Specimen Description BLOOD RIGHT ASSIST CONTROL  Final   Special Requests BOTTLES DRAWN AEROBIC AND ANAEROBIC 4CC  Final   Culture NO GROWTH 2 DAYS  Final   Report Status PENDING  Incomplete  MRSA PCR Screening     Status: None   Collection Time: 05/05/2015 11:53 PM  Result Value Ref Range Status   MRSA by PCR NEGATIVE NEGATIVE Final    Comment:        The GeneXpert MRSA Assay (FDA approved for NASAL specimens only), is one component of a comprehensive MRSA colonization surveillance program. It is not intended to diagnose MRSA infection nor to guide or monitor treatment for MRSA infections.     Medical History: Past Medical History  Diagnosis Date  . COPD (chronic obstructive pulmonary disease) (HCC)   . Chronic respiratory failure (HCC)   . Diabetes mellitus without complication (HCC)   . Chronic diastolic heart failure (HCC)   . Hypertension   . 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 Patient had bump in serum creatinine from 2.74 to 3.22 mg/dl (AKI).   Goal of Therapy:  Vancomycin trough level 15-20 mcg/ml  Plan:   Vancomycin: Due to significant bump in serum creatinine, will order a random level to be ordered 36 hours  post last dose of Vancomycin. Random level ordered for 0800 on 12/6. I changed Vancomycin frequency from q36 hours to q48 hours. Frequency will likely need to be adjusted based on random level result.   Zosyn: Dose adjusted due to patient being in AKI. CrCl can't be accurately calculated in AKI; therefore will dose as if CrCl <10 ml/min.  Pharmacy will continue to follow renal function and culture results.    Rhonda Garner 04/28/2015,7:52 AM

## 2015-05-14 NOTE — Progress Notes (Signed)
Pt noted to have  no HR and respirations. Pronounced at 1920  Family at bedside chaplin called to visit.with family grieving appropriately . physician and house supervisor notified and CDS reference # noted in chart .pt sent to morgue @2050 .

## 2015-05-14 NOTE — Discharge Summary (Signed)
  [facility] [place] [facility]    Death Note     Death Note please see Last Note for all details.   In breif - Mrs. Rhonda Garner is a 70 year old woman who presented in respiratory distress. She had a known history of lung cancer and had not wanted to pursue any further treatment. She had a postobstructive pneumonia on admission. She also had ventilator-dependent respiratory failure, was intubated on admission and maintained in the ICU. After multiple discussions with her POA, nephew, Rhonda Garner, it was determined that intensive care is not in line with her wishes. Indeed, whenever sedation was lightened she attempted to self extubate and gestured that she wanted the tube removed. She also indicated she understood that this would mean she would not survive.. On 05/11/2015 the family decided to withdraw care. She was extubated and passed quickly with comfort measures in place.    Rhonda Garner NFA:[id number],ION:[id number],MRN:[id number] is a 70 y.o. female, Outpatient Primary MD for the patient is Sherrie MustacheFayegh Jadali, MD  Pronounced dead by Rhonda PopperMichelle Williams RN on   04/28/2015   @     8:50 3 PM              Cause of death respiratory failure due to postobstructive pneumonia in the setting of lung cancer   Rhonda ShowersWALSH, Rhonda Garner M.D on 04/20/2015 at 4:45 PM  [facility] [place] [facility]    OFFICE [phone]  Total clinical and documentation time for today 55 minutes   Last Note

## 2015-05-14 NOTE — Progress Notes (Signed)
Nutrition Follow-up     INTERVENTION:   EN: plan to reinsert OG tube per Md Ramachandran; recommend restarting TF via OG as per order set once placement confirmed  NUTRITION DIAGNOSIS:   Inadequate oral intake related to inability to eat as evidenced by NPO status.  GOAL:   Provide needs based on ASPEN/SCCM guidelines  MONITOR:    (Energy intake, Electrolyte and renal Profile, Pulmonary Profile, Anthropometrics)  REASON FOR ASSESSMENT:   Ventilator    ASSESSMENT:   Pt admitted with h/o COPD and RLL lung mass currently intubated on the vent.  Pt with episodes of extreme agitation/combativeness; s/p self-extubation and re-intubation this AM, currently sedated on vent  Diet Order:  Diet NPO time specified   EN: pt pulled OG out during self-extubation this AM, TF previously infusing at 50 ml/hr  Skin:  Reviewed, no issues  Last BM:  unknown, +BS, soft abdomen per Nsg   Electrolyte and Renal Profile:  Recent Labs Lab 04/15/15 0513 04/16/15 0405 Apr 09, 2015 0438  BUN 50* 56* 70*  CREATININE 2.92* 2.74* 3.22*  NA 144 145 145  K 3.4* 3.4* 4.3   Glucose Profile:  Recent Labs  Apr 09, 2015 0351 Apr 09, 2015 0713 Apr 09, 2015 1119  GLUCAP 158* 175* 177*   Meds: NS at 125 ml/hr, ss novolog, solumedrol  Height:   Ht Readings from Last 1 Encounters:  04/20/2015 5\' 2"  (1.575 m)    Weight:   Wt Readings from Last 1 Encounters:  Apr 09, 2015 254 lb 10.1 oz (115.5 kg)    BMI:  Body mass index is 46.56 kg/(m^2).  Estimated Nutritional Needs:   Kcal:  1173-1498kcals, (11-14kcals/kg) using current weight of 106.7kg  Protein:  2.0-2.5g/kg, 100-125g protein, using IBW of 50kg  Fluid:  1500-173150mL of fluid (30-4235mL/kg) using IBW of 50kg  EDUCATION NEEDS:   Education needs no appropriate at this time  HIGH Care Level  Romelle Starcherate Jacobo Moncrief MS, RD, LDN [phone] Pager

## 2015-05-14 DEATH — deceased

## 2015-05-19 MED ORDER — ONDANSETRON HCL 4 MG/2ML IJ SOLN
INTRAMUSCULAR | Status: AC
  Filled 2015-05-19: qty 2

## 2015-12-23 IMAGING — CR DG CHEST 1V PORT
1 series · 1 of 1 positions shown · non-contrast
Comparison: 04/14/2015

CLINICAL DATA: Status post intubation and orogastric tube
placement.

EXAM:
PORTABLE CHEST 1 VIEW

[ap]
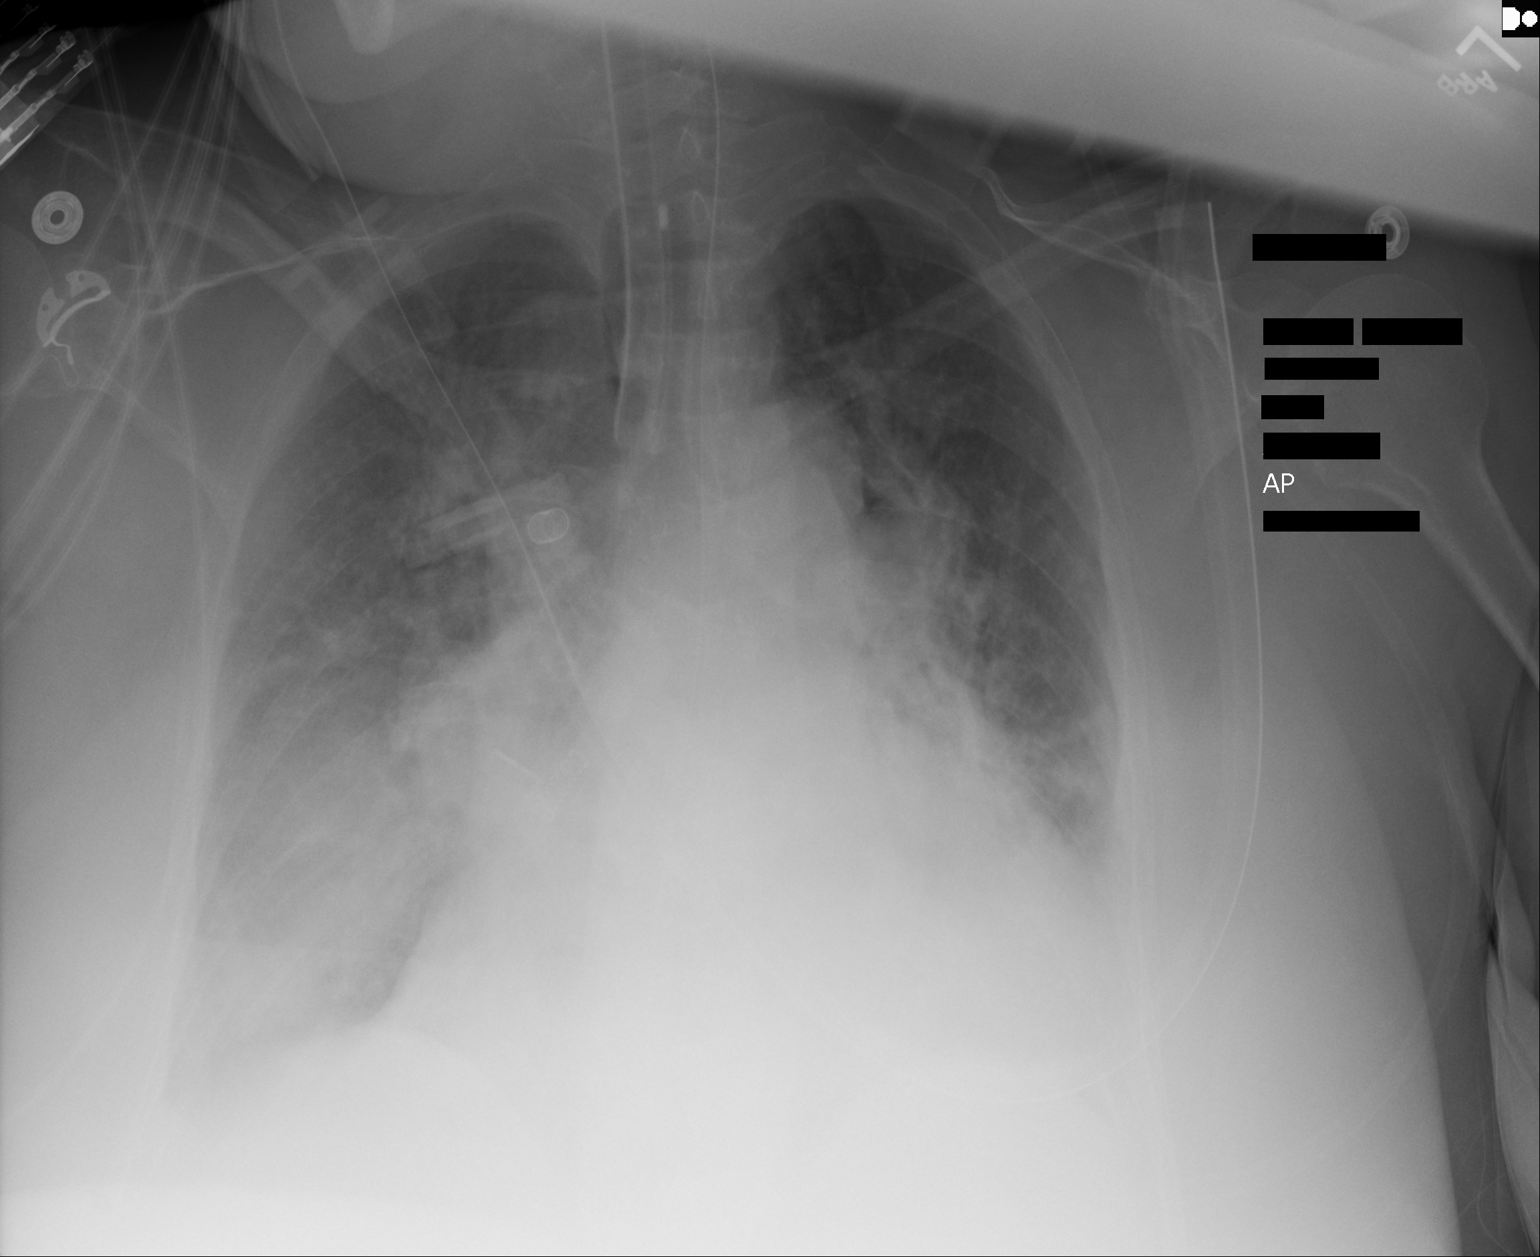

[1 of 1 positions shown; findings below may reference images not displayed]

FINDINGS: Endotracheal tube overlies the tracheal air column and terminates at
the level of the clavicular heads. Enteric catheter transverses the
thorax, tip collimated off the image.

Cardiomediastinal silhouette is normal. Mediastinal contours appear
intact.

There are bilateral lower lobe predominant airspace opacities and
likely bilateral pleural effusions. No evidence of pneumothorax.

Osseous structures are without acute abnormality. Soft tissues are
grossly normal.
IMPRESSION: Status post intubation, endotracheal tube in satisfactory position.

Enteric catheter collimated off the image.

Persistent bilateral lower lobe airspace consolidation, with
probably bilateral pleural effusions. Right lower lobe pulmonary
mass seen on prior CT is not as well visualized/obscured by airspace
consolidation.

## 2015-12-25 IMAGING — CR DG ABD PORTABLE 1V
1 series · 1 of 1 positions shown · non-contrast
Comparison: Portable AP abdominal film April 15, 2015

CLINICAL DATA: Assess orogastric tube placement.

EXAM:
PORTABLE ABDOMEN - 1 VIEW

[ap]
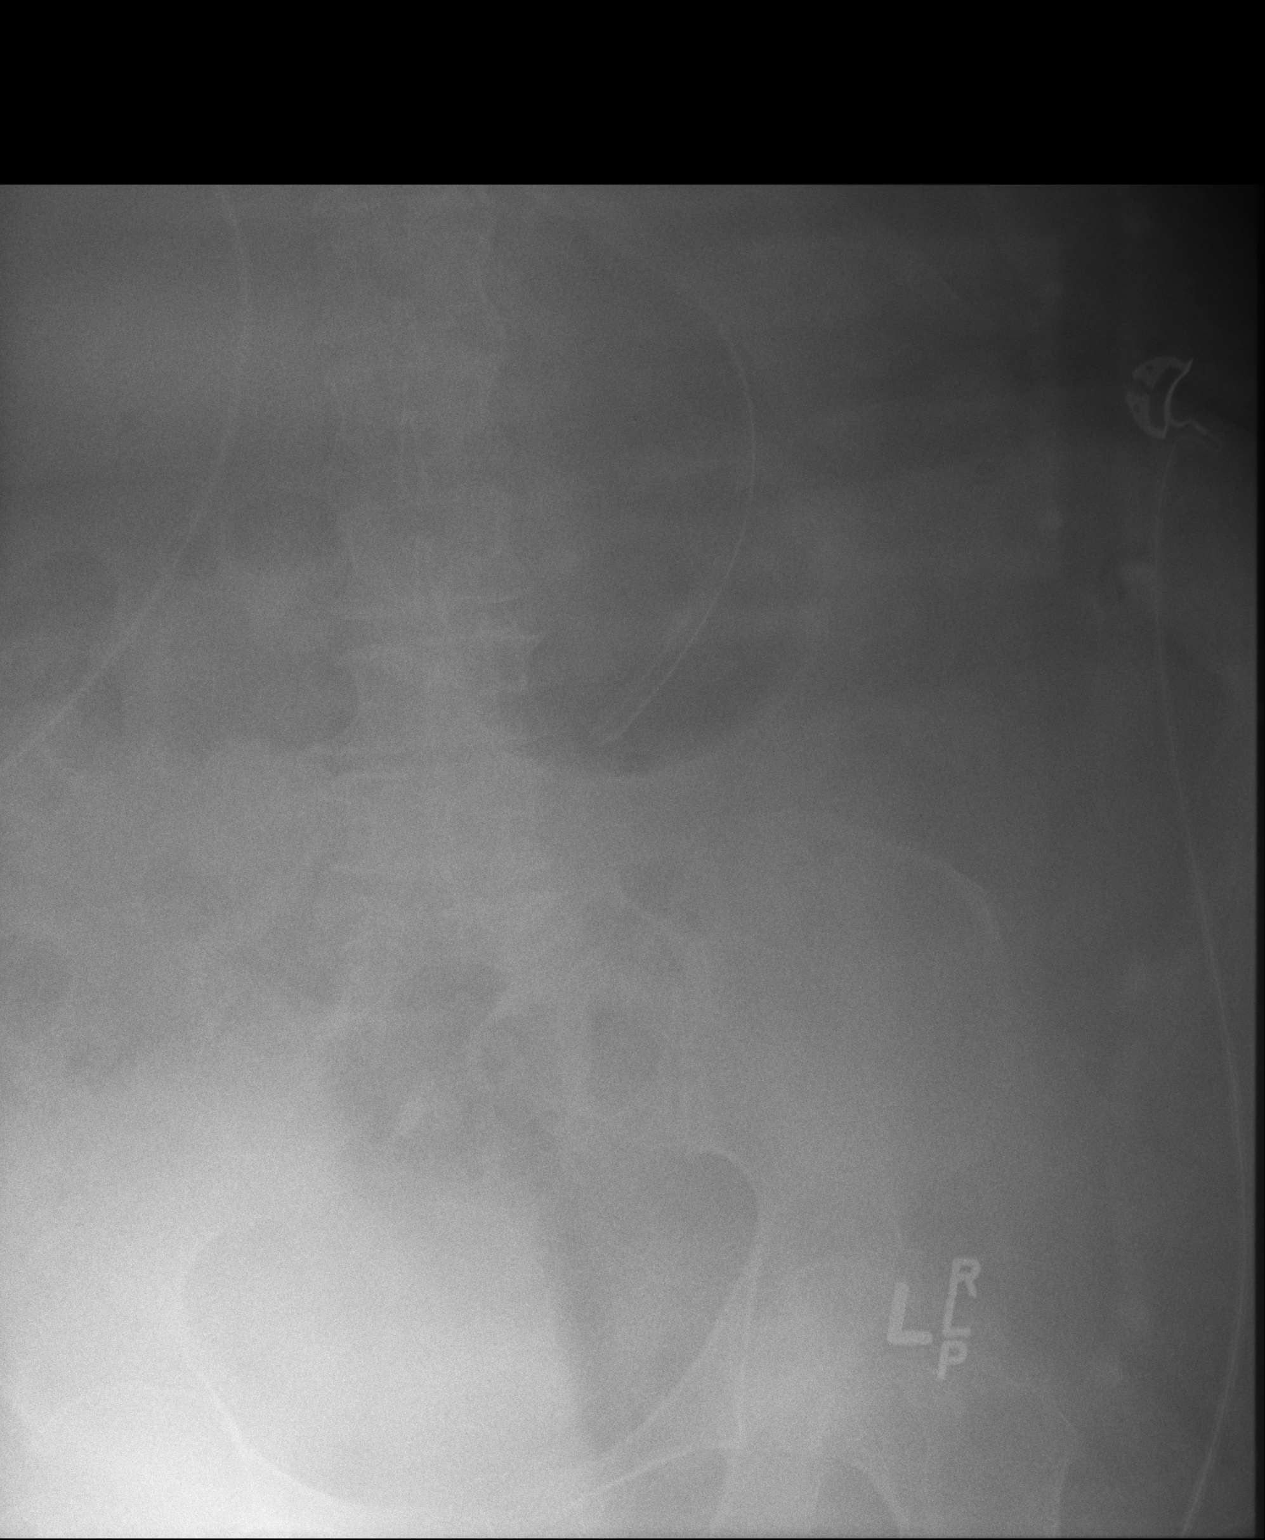

[1 of 1 positions shown; findings below may reference images not displayed]

FINDINGS: The esophagogastric tube tip lies in the region of the mid gastric
body. The bowel gas pattern where visualized is unremarkable.
IMPRESSION: The esophagogastric tube tip overlies the mid gastric body.
